# Patient Record
Sex: Female | Born: 1940 | Race: Black or African American | Hispanic: No | Smoking: Never smoker
Health system: Southern US, Community
[De-identification: ages and names within clinical notes are randomized; demographics above are authoritative.]

## PROBLEM LIST (undated history)

## (undated) DIAGNOSIS — K649 Unspecified hemorrhoids: Secondary | ICD-10-CM

## (undated) DIAGNOSIS — M1990 Unspecified osteoarthritis, unspecified site: Secondary | ICD-10-CM

## (undated) DIAGNOSIS — T4145XA Adverse effect of unspecified anesthetic, initial encounter: Secondary | ICD-10-CM

## (undated) DIAGNOSIS — I10 Essential (primary) hypertension: Secondary | ICD-10-CM

## (undated) DIAGNOSIS — H33199 Other retinoschisis and retinal cysts, unspecified eye: Secondary | ICD-10-CM

## (undated) DIAGNOSIS — T8859XA Other complications of anesthesia, initial encounter: Secondary | ICD-10-CM

## (undated) DIAGNOSIS — E039 Hypothyroidism, unspecified: Secondary | ICD-10-CM

## (undated) DIAGNOSIS — E785 Hyperlipidemia, unspecified: Secondary | ICD-10-CM

## (undated) HISTORY — PX: CHOLECYSTECTOMY: SHX55

## (undated) HISTORY — PX: CATARACT EXTRACTION: SUR2

## (undated) HISTORY — DX: Unspecified hemorrhoids: K64.9

## (undated) HISTORY — DX: Unspecified osteoarthritis, unspecified site: M19.90

## (undated) HISTORY — DX: Essential (primary) hypertension: I10

## (undated) HISTORY — DX: Hyperlipidemia, unspecified: E78.5

## (undated) HISTORY — PX: ABDOMINAL HYSTERECTOMY: SHX81

---

## 1998-03-15 ENCOUNTER — Ambulatory Visit (HOSPITAL_COMMUNITY): Admission: RE | Admit: 1998-03-15 | Discharge: 1998-03-15 | Payer: Self-pay | Admitting: Pulmonary Disease

## 1998-03-15 ENCOUNTER — Encounter: Payer: Self-pay | Admitting: Pulmonary Disease

## 1999-08-16 ENCOUNTER — Other Ambulatory Visit: Admission: RE | Admit: 1999-08-16 | Discharge: 1999-08-16 | Payer: Self-pay | Admitting: Obstetrics and Gynecology

## 2000-01-31 ENCOUNTER — Encounter: Payer: Self-pay | Admitting: Pulmonary Disease

## 2000-01-31 ENCOUNTER — Ambulatory Visit: Admission: RE | Admit: 2000-01-31 | Discharge: 2000-01-31 | Payer: Self-pay | Admitting: Pulmonary Disease

## 2002-04-16 ENCOUNTER — Ambulatory Visit (HOSPITAL_BASED_OUTPATIENT_CLINIC_OR_DEPARTMENT_OTHER): Admission: RE | Admit: 2002-04-16 | Discharge: 2002-04-16 | Payer: Self-pay | Admitting: Pulmonary Disease

## 2002-12-19 ENCOUNTER — Ambulatory Visit (HOSPITAL_COMMUNITY): Admission: RE | Admit: 2002-12-19 | Discharge: 2002-12-19 | Payer: Self-pay | Admitting: Ophthalmology

## 2002-12-19 ENCOUNTER — Encounter: Payer: Self-pay | Admitting: Ophthalmology

## 2004-06-28 ENCOUNTER — Ambulatory Visit (HOSPITAL_COMMUNITY): Admission: RE | Admit: 2004-06-28 | Discharge: 2004-06-28 | Payer: Self-pay | Admitting: Gastroenterology

## 2004-09-06 ENCOUNTER — Emergency Department (HOSPITAL_COMMUNITY): Admission: EM | Admit: 2004-09-06 | Discharge: 2004-09-06 | Payer: Self-pay | Admitting: Family Medicine

## 2004-09-07 ENCOUNTER — Ambulatory Visit (HOSPITAL_COMMUNITY): Admission: RE | Admit: 2004-09-07 | Discharge: 2004-09-07 | Payer: Self-pay | Admitting: Pulmonary Disease

## 2004-09-25 ENCOUNTER — Emergency Department (HOSPITAL_COMMUNITY): Admission: EM | Admit: 2004-09-25 | Discharge: 2004-09-25 | Payer: Self-pay | Admitting: Family Medicine

## 2006-08-12 ENCOUNTER — Ambulatory Visit: Payer: Self-pay | Admitting: Vascular Surgery

## 2007-11-27 ENCOUNTER — Emergency Department (HOSPITAL_COMMUNITY): Admission: EM | Admit: 2007-11-27 | Discharge: 2007-11-27 | Payer: Self-pay | Admitting: Emergency Medicine

## 2010-08-25 NOTE — Op Note (Signed)
NAMELINDSEA, OLIVAR               ACCOUNT NO.:  [id number]   MEDICAL RECORD NO.:  [id number]          PATIENT TYPE:  AMB   LOCATION:  ENDO                         FACILITY:  [facility]   PHYSICIAN:  Anselmo Rod, M.D.  DATE OF BIRTH:  11-03-1940   DATE OF PROCEDURE:  06/28/2004  DATE OF DISCHARGE:                                 OPERATIVE REPORT   PROCEDURE PERFORMED:  Screening colonoscopy.   ENDOSCOPIST:  Anselmo Rod, M.D.   INSTRUMENT USED:  Olympus video colonoscope.   INDICATION FOR PROCEDURE:  [age]-year-old African American female  undergoing screening colonoscopy with change in bowel habits, rectal  bleeding and family history of colon cancer to rule out colonic polyps,  masses, etc.   PREPROCEDURE PREPARATION:  Informed consent was procured from the patient.  The patient was fasted for 8 hours prior to the procedure and prepped with a  bottle of magnesium citrate and a gallon of GoLYTELY the night prior to the  procedure.  Risks and benefits of the procedure including a 10% missed rate  of cancer in polyps was discussed with the patient as well.   PREPROCEDURE PHYSICAL:  VITAL SIGNS:  The patient had stable vital signs.  NECK:  Supple.  CHEST:  Clear to auscultation.  S1 and S2 regular.  ABDOMEN:  Soft with normal bowel sounds.   DESCRIPTION OF PROCEDURE:  The patient was placed in the left lateral  decubitus position and sedated with 80 mg of Demerol and 7.5 mg of Versed in  slow incremental doses.  Once the patient was adequately sedated and  maintained on low-flow oxygen and continuous cardiac monitoring, the Olympus  video colonoscope was advanced from the rectum to the cecum with extreme  difficulty.  The patient's position was changed from the left lateral to the  supine and the right lateral position with gentle application of abdominal  pressure and then back again to her supine position to reach the cecum.  The  patient had a significant amount of  residual stool in the colon; multiple  washes were done.  The patient had a lipomatous IC valve.  The appendiceal  orifice and ileocecal valve were clearly visualized and photographed.  A few  early scattered sigmoid diverticula were seen, but these were very, very  small and in the early stages of formation.  Retroflexion in the rectum  revealed small internal hemorrhoids.  The patient tolerated the procedure  well without complications.   IMPRESSION:  1.  Small non-bleeding internal hemorrhoid.  2.  Lipomatous ileocecal valve.  3.  A few early scattered diverticula.  4.  Very difficult procedure.   RECOMMENDATIONS:  1.  Repeat exam is recommended in the next 5 years; a barium enema will      probably be a better option at that time.  2.  Outpatient followup as need arises in the future.  3.  High-fiber diet with liberal fluid intake.      JNM/MEDQ  D:  06/28/2004  T:  06/28/2004  Job:  [id number]   cc:   Leonette Most A. Clearance Coots, M.D.  Eino Farber., M.D.  [address] [place]  [place] [address]  Fax: [phone]

## 2010-10-26 ENCOUNTER — Ambulatory Visit (INDEPENDENT_AMBULATORY_CARE_PROVIDER_SITE_OTHER): Payer: Medicare Other

## 2010-10-26 ENCOUNTER — Inpatient Hospital Stay (INDEPENDENT_AMBULATORY_CARE_PROVIDER_SITE_OTHER)
Admission: RE | Admit: 2010-10-26 | Discharge: 2010-10-26 | Disposition: A | Discharge status: Home / Self Care | Payer: Medicare Other | Source: Ambulatory Visit | Attending: Emergency Medicine | Admitting: Emergency Medicine

## 2010-10-26 DIAGNOSIS — S82899A Other fracture of unspecified lower leg, initial encounter for closed fracture: Secondary | ICD-10-CM

## 2010-10-26 DIAGNOSIS — S8000XA Contusion of unspecified knee, initial encounter: Secondary | ICD-10-CM

## 2011-04-19 DIAGNOSIS — E1139 Type 2 diabetes mellitus with other diabetic ophthalmic complication: Secondary | ICD-10-CM | POA: Diagnosis not present

## 2011-04-19 DIAGNOSIS — H2010 Chronic iridocyclitis, unspecified eye: Secondary | ICD-10-CM | POA: Diagnosis not present

## 2011-04-19 DIAGNOSIS — Z113 Encounter for screening for infections with a predominantly sexual mode of transmission: Secondary | ICD-10-CM | POA: Diagnosis not present

## 2011-04-19 DIAGNOSIS — E11311 Type 2 diabetes mellitus with unspecified diabetic retinopathy with macular edema: Secondary | ICD-10-CM | POA: Diagnosis not present

## 2011-04-19 DIAGNOSIS — E119 Type 2 diabetes mellitus without complications: Secondary | ICD-10-CM | POA: Diagnosis not present

## 2011-04-19 DIAGNOSIS — H3020 Posterior cyclitis, unspecified eye: Secondary | ICD-10-CM | POA: Diagnosis not present

## 2011-05-22 DIAGNOSIS — H35359 Cystoid macular degeneration, unspecified eye: Secondary | ICD-10-CM | POA: Diagnosis not present

## 2011-05-22 DIAGNOSIS — H2010 Chronic iridocyclitis, unspecified eye: Secondary | ICD-10-CM | POA: Diagnosis not present

## 2011-05-30 DIAGNOSIS — I10 Essential (primary) hypertension: Secondary | ICD-10-CM | POA: Diagnosis not present

## 2011-05-30 DIAGNOSIS — H209 Unspecified iridocyclitis: Secondary | ICD-10-CM | POA: Diagnosis not present

## 2011-05-30 DIAGNOSIS — E119 Type 2 diabetes mellitus without complications: Secondary | ICD-10-CM | POA: Diagnosis not present

## 2011-05-30 DIAGNOSIS — M899 Disorder of bone, unspecified: Secondary | ICD-10-CM | POA: Diagnosis not present

## 2011-05-30 DIAGNOSIS — E669 Obesity, unspecified: Secondary | ICD-10-CM | POA: Diagnosis not present

## 2011-06-19 DIAGNOSIS — H2010 Chronic iridocyclitis, unspecified eye: Secondary | ICD-10-CM | POA: Diagnosis not present

## 2011-06-19 DIAGNOSIS — B0051 Herpesviral iridocyclitis: Secondary | ICD-10-CM | POA: Diagnosis not present

## 2011-06-19 DIAGNOSIS — H3520 Other non-diabetic proliferative retinopathy, unspecified eye: Secondary | ICD-10-CM | POA: Diagnosis not present

## 2011-08-21 DIAGNOSIS — H579 Unspecified disorder of eye and adnexa: Secondary | ICD-10-CM | POA: Diagnosis not present

## 2011-08-21 DIAGNOSIS — E1139 Type 2 diabetes mellitus with other diabetic ophthalmic complication: Secondary | ICD-10-CM | POA: Diagnosis not present

## 2011-08-21 DIAGNOSIS — E119 Type 2 diabetes mellitus without complications: Secondary | ICD-10-CM | POA: Diagnosis not present

## 2011-08-21 DIAGNOSIS — B0051 Herpesviral iridocyclitis: Secondary | ICD-10-CM | POA: Diagnosis not present

## 2011-08-21 DIAGNOSIS — H2010 Chronic iridocyclitis, unspecified eye: Secondary | ICD-10-CM | POA: Diagnosis not present

## 2011-08-21 DIAGNOSIS — E11329 Type 2 diabetes mellitus with mild nonproliferative diabetic retinopathy without macular edema: Secondary | ICD-10-CM | POA: Diagnosis not present

## 2011-08-30 DIAGNOSIS — S82899A Other fracture of unspecified lower leg, initial encounter for closed fracture: Secondary | ICD-10-CM | POA: Diagnosis not present

## 2011-08-30 DIAGNOSIS — M25569 Pain in unspecified knee: Secondary | ICD-10-CM | POA: Diagnosis not present

## 2011-09-06 DIAGNOSIS — S82899A Other fracture of unspecified lower leg, initial encounter for closed fracture: Secondary | ICD-10-CM | POA: Diagnosis not present

## 2011-09-11 DIAGNOSIS — S82899A Other fracture of unspecified lower leg, initial encounter for closed fracture: Secondary | ICD-10-CM | POA: Diagnosis not present

## 2011-09-14 DIAGNOSIS — S82899A Other fracture of unspecified lower leg, initial encounter for closed fracture: Secondary | ICD-10-CM | POA: Diagnosis not present

## 2011-09-18 DIAGNOSIS — S93409A Sprain of unspecified ligament of unspecified ankle, initial encounter: Secondary | ICD-10-CM | POA: Diagnosis not present

## 2011-09-20 DIAGNOSIS — S82899A Other fracture of unspecified lower leg, initial encounter for closed fracture: Secondary | ICD-10-CM | POA: Diagnosis not present

## 2011-09-27 DIAGNOSIS — E119 Type 2 diabetes mellitus without complications: Secondary | ICD-10-CM | POA: Diagnosis not present

## 2011-09-27 DIAGNOSIS — I10 Essential (primary) hypertension: Secondary | ICD-10-CM | POA: Diagnosis not present

## 2011-09-27 DIAGNOSIS — H209 Unspecified iridocyclitis: Secondary | ICD-10-CM | POA: Diagnosis not present

## 2011-09-27 DIAGNOSIS — E039 Hypothyroidism, unspecified: Secondary | ICD-10-CM | POA: Diagnosis not present

## 2011-09-27 DIAGNOSIS — E785 Hyperlipidemia, unspecified: Secondary | ICD-10-CM | POA: Diagnosis not present

## 2011-10-05 ENCOUNTER — Encounter (INDEPENDENT_AMBULATORY_CARE_PROVIDER_SITE_OTHER): Payer: Self-pay | Admitting: General Surgery

## 2011-10-05 ENCOUNTER — Ambulatory Visit (INDEPENDENT_AMBULATORY_CARE_PROVIDER_SITE_OTHER): Payer: Medicare Other | Admitting: General Surgery

## 2011-10-05 VITALS — BP 162/94 | HR 84 | Temp 97.2°F | Resp 18 | Ht 63.0 in | Wt 209.4 lb

## 2011-10-05 DIAGNOSIS — K644 Residual hemorrhoidal skin tags: Secondary | ICD-10-CM | POA: Diagnosis not present

## 2011-10-05 DIAGNOSIS — K648 Other hemorrhoids: Secondary | ICD-10-CM | POA: Diagnosis not present

## 2011-10-05 MED ORDER — HYDROCORTISONE ACETATE 25 MG RE SUPP: 25.0000 mg | Freq: Two times a day (BID) | RECTAL | Status: AC

## 2011-10-05 NOTE — Patient Instructions (Signed)
Hemorrhoids  Hemorrhoids are enlarged (dilated) veins around the rectum. There are 2 types of hemorrhoids, and the type of hemorrhoid is determined by its location. Internal hemorrhoids occur in the veins just inside the rectum.They are usually not painful, but they may bleed.However, they may poke through to the outside and become irritated and painful. External hemorrhoids involve the veins outside the anus and can be felt as a painful swelling or hard lump near the anus.They are often itchy and may crack and bleed. Sometimes clots will form in the veins. This makes them swollen and painful. These are called thrombosed hemorrhoids. CAUSES Causes of hemorrhoids include:  Pregnancy. This increases the pressure in the hemorrhoidal veins.   Constipation.   Straining to have a bowel movement.   Obesity.   Heavy lifting or other activity that caused you to strain.   TREATMENT Most of the time hemorrhoids improve in 1 to 2 weeks. However, if symptoms do not seem to be getting better or if you have a lot of rectal bleeding, your caregiver may perform a procedure to help make the hemorrhoids get smaller or remove them completely.Possible treatments include:  Rubber band ligation. A rubber band is placed at the base of the hemorrhoid to cut off the circulation.   Sclerotherapy. A chemical is injected to shrink the hemorrhoid.   Infrared light therapy. Tools are used to burn the hemorrhoid.   Hemorrhoidectomy. This is surgical removal of the hemorrhoid.   HOME CARE INSTRUCTIONS   Increase fiber in your diet. Ask your caregiver about using fiber supplements.   Drink enough water and fluids to keep your urine clear or pale yellow.   Exercise regularly.   Go to the bathroom when you have the urge to have a bowel movement. Do not wait.   Avoid straining to have bowel movements.   Keep the anal area dry and clean.   Only take over-the-counter or prescription medicines for pain,  discomfort, or fever as directed by your caregiver.   Take warm sitz baths for 20 to 30 minutes, 3 to 4 times per day.   If the hemorrhoids are very tender and swollen, place ice packs on the area as tolerated. Using ice packs between sitz baths may be helpful. Fill a plastic bag with ice. Place a towel between the bag of ice and your skin.   Medicated creams and suppositories may be used or applied as directed.   Do not use a donut-shaped pillow or sit on the toilet for long periods. This increases blood pooling and pain.   SEEK MEDICAL CARE IF:   You have increasing pain and swelling that is not controlled with your medicine.   You have uncontrolled bleeding.   You have difficulty or you are unable to have a bowel movement.   You have pain or inflammation outside the area of the hemorrhoids.   You have chills or an oral temperature above 102 F (38.9 C).     

## 2011-10-07 NOTE — Progress Notes (Signed)
HISTORY: Pt is 71 year old female that I saw last year for hemorrhoids.  I injected the internal ones.  She did well and was around 90% better on follow up.  Since that time, however, her hemorrhoids have come back and are worsening.  She ran out of prescription hemorrhoid medication.  She has been taking stool softeners and has tried to avoid spending significant time on the toilet.  She is having bleeding, soreness, swelling, difficulty cleaning, and a sense of seeping of stool at times.     PERTINENT REVIEW OF SYSTEMS: Otherwise negative.     EXAM: Head: Normocephalic and atraumatic.  Eyes:  Conjunctivae are normal. Pupils are equal, round, and reactive to light. No scleral icterus.  Neck:  Normal range of motion. Neck supple. No tracheal deviation present. No thyromegaly present.  Resp: No respiratory distress, normal effort. Rectal:  Circumferential prolapsing internal and external hemorrhoids.  Anoscopy performed.  The internal ones are reduced and injected.   Neurological: Alert and oriented to person, place, and time. Coordination normal.  Skin: Skin is warm and dry. No rash noted. No diaphoretic. No erythema. No pallor.  Psychiatric: Normal mood and affect. Normal behavior. Judgment and thought content normal.      ASSESSMENT AND PLAN:   Internal and external prolapsed hemorrhoids, bleeding. Pt with recurrent hemorrhoids. Internal ones injected. Pt prescribed suppositories.  Will follow up in 6-8 weeks. May need other injection. External hemorrhoids are also problematic.  Will address later.  Reviewed bowel regimen and stressed importance of good bowel habits to pt.      Maudry Diego, MD Surgical Oncology, General & Endocrine Surgery [facility]  Gwen Pounds, MD Charna Elizabeth, MD

## 2011-10-07 NOTE — Assessment & Plan Note (Signed)
Pt with recurrent hemorrhoids. Internal ones injected. Pt prescribed suppositories.  Will follow up in 6-8 weeks. May need other injection. External hemorrhoids are also problematic.  Will address later.  Reviewed bowel regimen and stressed importance of good bowel habits to pt.

## 2011-10-23 DIAGNOSIS — E1139 Type 2 diabetes mellitus with other diabetic ophthalmic complication: Secondary | ICD-10-CM | POA: Diagnosis not present

## 2011-10-23 DIAGNOSIS — B0051 Herpesviral iridocyclitis: Secondary | ICD-10-CM | POA: Diagnosis not present

## 2011-10-23 DIAGNOSIS — E11329 Type 2 diabetes mellitus with mild nonproliferative diabetic retinopathy without macular edema: Secondary | ICD-10-CM | POA: Diagnosis not present

## 2011-10-23 DIAGNOSIS — E119 Type 2 diabetes mellitus without complications: Secondary | ICD-10-CM | POA: Diagnosis not present

## 2011-10-23 DIAGNOSIS — H579 Unspecified disorder of eye and adnexa: Secondary | ICD-10-CM | POA: Diagnosis not present

## 2012-03-26 DIAGNOSIS — I10 Essential (primary) hypertension: Secondary | ICD-10-CM | POA: Diagnosis not present

## 2012-03-26 DIAGNOSIS — E119 Type 2 diabetes mellitus without complications: Secondary | ICD-10-CM | POA: Diagnosis not present

## 2012-03-26 DIAGNOSIS — E039 Hypothyroidism, unspecified: Secondary | ICD-10-CM | POA: Diagnosis not present

## 2012-03-26 DIAGNOSIS — E785 Hyperlipidemia, unspecified: Secondary | ICD-10-CM | POA: Diagnosis not present

## 2012-04-29 DIAGNOSIS — H3020 Posterior cyclitis, unspecified eye: Secondary | ICD-10-CM | POA: Diagnosis not present

## 2012-04-29 DIAGNOSIS — E1139 Type 2 diabetes mellitus with other diabetic ophthalmic complication: Secondary | ICD-10-CM | POA: Diagnosis not present

## 2012-04-29 DIAGNOSIS — H35359 Cystoid macular degeneration, unspecified eye: Secondary | ICD-10-CM | POA: Diagnosis not present

## 2012-04-29 DIAGNOSIS — E119 Type 2 diabetes mellitus without complications: Secondary | ICD-10-CM | POA: Diagnosis not present

## 2012-04-29 DIAGNOSIS — E11311 Type 2 diabetes mellitus with unspecified diabetic retinopathy with macular edema: Secondary | ICD-10-CM | POA: Diagnosis not present

## 2012-04-29 DIAGNOSIS — H3520 Other non-diabetic proliferative retinopathy, unspecified eye: Secondary | ICD-10-CM | POA: Diagnosis not present

## 2012-04-29 DIAGNOSIS — B0051 Herpesviral iridocyclitis: Secondary | ICD-10-CM | POA: Diagnosis not present

## 2012-04-29 DIAGNOSIS — H2010 Chronic iridocyclitis, unspecified eye: Secondary | ICD-10-CM | POA: Diagnosis not present

## 2012-05-13 DIAGNOSIS — I10 Essential (primary) hypertension: Secondary | ICD-10-CM | POA: Diagnosis not present

## 2012-05-13 DIAGNOSIS — IMO0001 Reserved for inherently not codable concepts without codable children: Secondary | ICD-10-CM | POA: Diagnosis not present

## 2012-05-13 DIAGNOSIS — Z0000 Encounter for general adult medical examination without abnormal findings: Secondary | ICD-10-CM | POA: Diagnosis not present

## 2012-05-13 DIAGNOSIS — E785 Hyperlipidemia, unspecified: Secondary | ICD-10-CM | POA: Diagnosis not present

## 2012-05-13 DIAGNOSIS — Z23 Encounter for immunization: Secondary | ICD-10-CM | POA: Diagnosis not present

## 2012-06-03 ENCOUNTER — Ambulatory Visit: Payer: Medicare Other | Admitting: Dietician

## 2012-06-19 ENCOUNTER — Encounter: Payer: Self-pay | Admitting: Dietician

## 2012-06-19 ENCOUNTER — Encounter: Payer: Medicare Other | Attending: Internal Medicine | Admitting: Dietician

## 2012-06-19 VITALS — Ht 63.0 in | Wt 208.1 lb

## 2012-06-19 DIAGNOSIS — Z713 Dietary counseling and surveillance: Secondary | ICD-10-CM | POA: Diagnosis not present

## 2012-06-19 DIAGNOSIS — E119 Type 2 diabetes mellitus without complications: Secondary | ICD-10-CM | POA: Diagnosis not present

## 2012-06-19 NOTE — Progress Notes (Signed)
Diabetes Self-Management Education  Visit Number: First/Initial  06/19/2012 [name], identified by name and date of birth, is a 72 y.o. female with Diabetes Type: Type 2.  Other people present during visit:   (none)   Patient Concerns:  Nutrition/Meal planning;Weight Control  Height 5\' 3"  (1.6 m), weight 208 lb 1.6 oz (94.394 kg). Body mass index is 36.87 kg/(m^2).  Lab Results: No results found for this basename: LDLCALC, HGBA1C     Family History  Problem Relation Age of Onset  . Cancer Father     lung  . Cancer Sister     breast   History  Substance Use Topics  . Smoking status: Never Smoker   . Smokeless tobacco: Never Used  . Alcohol Use: No    Support Systems:  Lives Alone Special Needs:  None  Prior DM Education:  Yes at [facility] a number of years ago. Daily Foot Exams: Yes Patient Belief / Attitude about Diabetes:  Diabetes can be controlled  Assessment comments: Has had previous diabetes education a number of years ago.  Has not always been following her diet and last year broke her ankle and did not exercise.  Wants to get back to walking and eating right.  Lives alone and does not want to go on insulin. Anxious to get back on track and to get the A1C to 7.0%. Most recent A1C is at 9.4%.  Diet Recall:  24-hr recall:  B ( AM): 7-9 Am  Oatmeal (may have raisins, small pack) use 5 minute oatmeal 1 cup with cinnamon and some ground flax seed..  Boiled egg or 5 [place] nuts. Water to drink.  Sometimes is she thinks her blood is low (Iron level) will add 1-2 oz of grape juice to a glass of 10 oz of water.   OR Cereal with flax seed barley and wheat at 1 cup with skim milk (3 oz). Snk ( AM): Piece of fruit orange or pear.  L ( PM): May have something about 2-2:30-3:00 PM Might be a cup of the cereal.  OR have leftovers of salmon, asparagus and brown rice Snk ( PM): rare, nuts [place] or hazlenuts D ( PM): 6:00 chicken wings with BBQ sauce 3, with white potatoes with  onions 1/2 cup and a little cabbage 1/2 dup. Snk ( PM): Try to snack before 9:00, tangerine or half an apple. Beverages: water,   Education Topics Reviewed with Patient Today:  Topic Points Discussed  Disease State    Nutrition Management Food label reading, portion sizes and measuring food.;Carbohydrate counting;Reviewed blood glucose goals for pre and post meals and how to evaluate the patients' food intake on their blood glucose level.  Physical Activity and Exercise    Medications Reviewed patients medication for diabetes, action, purpose, timing of dose and side effects.  Monitoring Taught/discussed recording of test results and interpretation of SMBG.;Daily foot exams  Acute Complications Discussed and identified patients' treatment of hypoglycemia  Chronic Complications    Psychosocial Adjustment    Goal Setting    Preconception Care (if applicable)      PATIENTS GOALS   Learning Objective(s):     Goal The patient agrees to:  Nutrition Follow meal plan discussed  Physical Activity Exercise 1-2 times per week  Medications take my medication as prescribed  Monitoring    Problem Solving    Reducing Risk    Health Coping      PERSONALIZED PLAN / SUPPORT  Self-Management Support:  Other (comment) (Lives  alone, doesn/t want to have to go on insulin.) ______________________________________________________________________  Outcomes:  Expected Outcomes:  Demonstrated interest in learning. Expect positive outcomes  Education material provided: Living Well with Diabetes, Controlling Diabetes, Carb Counting guide by [employer], [employer], [employer] and the [employer] with a Diet Prescription.  If problems or questions, patient to contact team via:  Phone  Time in: 1100     Time out: 1200  Future DSME appointment: - 4-6 wks   [facility] 06/19/2012 2:43 PM

## 2012-06-19 NOTE — Patient Instructions (Addendum)
   Test your blood glucose after the largest meal of the day (two hours after the first bite of the meal.  Goal is 80-160 mg).  Try to take the Glimepiride 30 minutes before the meal.  Continue to eat regular meals and snacks.  Record your glucose readings.  Ask the pharmacist regarding the eye medication and interaction with the Glimepiride.   Practice counting your carbs and measuring portions at home.

## 2012-06-26 DIAGNOSIS — E119 Type 2 diabetes mellitus without complications: Secondary | ICD-10-CM | POA: Diagnosis not present

## 2012-06-26 DIAGNOSIS — E669 Obesity, unspecified: Secondary | ICD-10-CM | POA: Diagnosis not present

## 2012-06-26 DIAGNOSIS — I10 Essential (primary) hypertension: Secondary | ICD-10-CM | POA: Diagnosis not present

## 2012-08-21 DIAGNOSIS — E039 Hypothyroidism, unspecified: Secondary | ICD-10-CM | POA: Diagnosis not present

## 2012-08-21 DIAGNOSIS — E785 Hyperlipidemia, unspecified: Secondary | ICD-10-CM | POA: Diagnosis not present

## 2012-08-21 DIAGNOSIS — E1169 Type 2 diabetes mellitus with other specified complication: Secondary | ICD-10-CM | POA: Diagnosis not present

## 2012-08-21 DIAGNOSIS — I10 Essential (primary) hypertension: Secondary | ICD-10-CM | POA: Diagnosis not present

## 2012-08-21 DIAGNOSIS — Z6835 Body mass index (BMI) 35.0-35.9, adult: Secondary | ICD-10-CM | POA: Diagnosis not present

## 2012-09-02 DIAGNOSIS — B0051 Herpesviral iridocyclitis: Secondary | ICD-10-CM | POA: Diagnosis not present

## 2012-09-02 DIAGNOSIS — H3520 Other non-diabetic proliferative retinopathy, unspecified eye: Secondary | ICD-10-CM | POA: Diagnosis not present

## 2012-09-02 DIAGNOSIS — H2010 Chronic iridocyclitis, unspecified eye: Secondary | ICD-10-CM | POA: Diagnosis not present

## 2012-09-02 DIAGNOSIS — E119 Type 2 diabetes mellitus without complications: Secondary | ICD-10-CM | POA: Diagnosis not present

## 2012-09-02 DIAGNOSIS — H3020 Posterior cyclitis, unspecified eye: Secondary | ICD-10-CM | POA: Diagnosis not present

## 2012-09-02 DIAGNOSIS — E11311 Type 2 diabetes mellitus with unspecified diabetic retinopathy with macular edema: Secondary | ICD-10-CM | POA: Diagnosis not present

## 2012-09-02 DIAGNOSIS — E1139 Type 2 diabetes mellitus with other diabetic ophthalmic complication: Secondary | ICD-10-CM | POA: Diagnosis not present

## 2012-09-02 DIAGNOSIS — H35359 Cystoid macular degeneration, unspecified eye: Secondary | ICD-10-CM | POA: Diagnosis not present

## 2012-11-17 ENCOUNTER — Ambulatory Visit (INDEPENDENT_AMBULATORY_CARE_PROVIDER_SITE_OTHER): Payer: Medicare Other | Admitting: General Surgery

## 2012-11-17 ENCOUNTER — Encounter (INDEPENDENT_AMBULATORY_CARE_PROVIDER_SITE_OTHER): Payer: Self-pay | Admitting: General Surgery

## 2012-11-17 VITALS — BP 128/88 | HR 84 | Temp 97.4°F | Resp 14 | Ht 63.0 in | Wt 202.6 lb

## 2012-11-17 DIAGNOSIS — K644 Residual hemorrhoidal skin tags: Secondary | ICD-10-CM

## 2012-11-17 DIAGNOSIS — K648 Other hemorrhoids: Secondary | ICD-10-CM

## 2012-11-17 NOTE — Assessment & Plan Note (Signed)
2 internal hemorrhoids banded.    2 injected.   These are pretty significant prolapsing hemorrhoids.    I will see her back in 6 weeks.  She may need additional injection and/or banding.   At that point, I will restart anusol or proctofoam.

## 2012-11-17 NOTE — Progress Notes (Signed)
HISTORY: Pt is 72 year old female that I saw last year for hemorrhoids.  I injected the internal ones. The patient is complaining of bleeding, difficulty cleaning, burning, and prolapse.  She denies itching.  She had colonoscopy last year that was negative for masses.  Her main complaints are bleeding and prolapse.  She frequently stands up and has blood dripping down her leg and onto the floor.     PERTINENT REVIEW OF SYSTEMS: Otherwise negative x 11.     EXAM: Head: Normocephalic and atraumatic.  Eyes:  Conjunctivae are normal. Pupils are equal, round, and reactive to light. No scleral icterus.  Neck:  Normal range of motion. Neck supple. No tracheal deviation present. No thyromegaly present.  Resp: No respiratory distress, normal effort. Rectal:  Circumferential prolapsing internal and external hemorrhoids. Internal ones that were prolapsed reduced.  Hemorrhoids very friable.  Anoscopy demonstrates very large circumferential internal hemorrhoids.  The posterior left and right ones were banded.  Two anteriorly were injected.   Neurological: Alert and oriented to person, place, and time. Coordination normal.  Skin: Skin is warm and dry. No rash noted. No diaphoretic. No erythema. No pallor.  Psychiatric: Normal mood and affect. Normal behavior. Judgment and thought content normal.      ASSESSMENT AND PLAN:   Internal and external prolapsed hemorrhoids, bleeding. 2 internal hemorrhoids banded.    2 injected.   These are pretty significant prolapsing hemorrhoids.    I will see her back in 6 weeks.  She may need additional injection and/or banding.   At that point, I will restart anusol or proctofoam.         Maudry Diego, MD Surgical Oncology, General & Endocrine Surgery [facility]  Gwen Pounds, MD No ref. provider found

## 2012-11-17 NOTE — Patient Instructions (Signed)
Anticipate some bleeding for the next day or so.  You should NOT be soaking through multiple pads, if so, call our office.    Follow up in 6 weeks.

## 2013-02-02 DIAGNOSIS — E785 Hyperlipidemia, unspecified: Secondary | ICD-10-CM | POA: Diagnosis not present

## 2013-02-02 DIAGNOSIS — Z6836 Body mass index (BMI) 36.0-36.9, adult: Secondary | ICD-10-CM | POA: Diagnosis not present

## 2013-02-02 DIAGNOSIS — K649 Unspecified hemorrhoids: Secondary | ICD-10-CM | POA: Diagnosis not present

## 2013-02-02 DIAGNOSIS — E039 Hypothyroidism, unspecified: Secondary | ICD-10-CM | POA: Diagnosis not present

## 2013-02-02 DIAGNOSIS — I10 Essential (primary) hypertension: Secondary | ICD-10-CM | POA: Diagnosis not present

## 2013-02-02 DIAGNOSIS — E669 Obesity, unspecified: Secondary | ICD-10-CM | POA: Diagnosis not present

## 2013-02-02 DIAGNOSIS — E119 Type 2 diabetes mellitus without complications: Secondary | ICD-10-CM | POA: Diagnosis not present

## 2013-02-02 DIAGNOSIS — Z23 Encounter for immunization: Secondary | ICD-10-CM | POA: Diagnosis not present

## 2013-02-27 DIAGNOSIS — E11311 Type 2 diabetes mellitus with unspecified diabetic retinopathy with macular edema: Secondary | ICD-10-CM | POA: Diagnosis not present

## 2013-02-27 DIAGNOSIS — H3020 Posterior cyclitis, unspecified eye: Secondary | ICD-10-CM | POA: Diagnosis not present

## 2013-02-27 DIAGNOSIS — B0051 Herpesviral iridocyclitis: Secondary | ICD-10-CM | POA: Diagnosis not present

## 2013-02-27 DIAGNOSIS — H35359 Cystoid macular degeneration, unspecified eye: Secondary | ICD-10-CM | POA: Diagnosis not present

## 2013-02-27 DIAGNOSIS — E1139 Type 2 diabetes mellitus with other diabetic ophthalmic complication: Secondary | ICD-10-CM | POA: Diagnosis not present

## 2013-02-27 DIAGNOSIS — H2010 Chronic iridocyclitis, unspecified eye: Secondary | ICD-10-CM | POA: Diagnosis not present

## 2013-02-27 DIAGNOSIS — E119 Type 2 diabetes mellitus without complications: Secondary | ICD-10-CM | POA: Diagnosis not present

## 2013-02-27 DIAGNOSIS — H3520 Other non-diabetic proliferative retinopathy, unspecified eye: Secondary | ICD-10-CM | POA: Diagnosis not present

## 2013-04-14 DIAGNOSIS — M545 Low back pain, unspecified: Secondary | ICD-10-CM | POA: Diagnosis not present

## 2013-04-21 DIAGNOSIS — M545 Low back pain, unspecified: Secondary | ICD-10-CM | POA: Diagnosis not present

## 2013-04-28 DIAGNOSIS — M545 Low back pain, unspecified: Secondary | ICD-10-CM | POA: Diagnosis not present

## 2013-05-04 DIAGNOSIS — M545 Low back pain, unspecified: Secondary | ICD-10-CM | POA: Diagnosis not present

## 2013-05-19 DIAGNOSIS — E039 Hypothyroidism, unspecified: Secondary | ICD-10-CM | POA: Diagnosis not present

## 2013-05-19 DIAGNOSIS — IMO0001 Reserved for inherently not codable concepts without codable children: Secondary | ICD-10-CM | POA: Diagnosis not present

## 2013-05-19 DIAGNOSIS — I10 Essential (primary) hypertension: Secondary | ICD-10-CM | POA: Diagnosis not present

## 2013-05-19 DIAGNOSIS — E785 Hyperlipidemia, unspecified: Secondary | ICD-10-CM | POA: Diagnosis not present

## 2013-05-28 DIAGNOSIS — I10 Essential (primary) hypertension: Secondary | ICD-10-CM | POA: Diagnosis not present

## 2013-05-28 DIAGNOSIS — I517 Cardiomegaly: Secondary | ICD-10-CM | POA: Diagnosis not present

## 2013-05-28 DIAGNOSIS — H209 Unspecified iridocyclitis: Secondary | ICD-10-CM | POA: Diagnosis not present

## 2013-05-28 DIAGNOSIS — I4949 Other premature depolarization: Secondary | ICD-10-CM | POA: Diagnosis not present

## 2013-05-28 DIAGNOSIS — E785 Hyperlipidemia, unspecified: Secondary | ICD-10-CM | POA: Diagnosis not present

## 2013-05-28 DIAGNOSIS — E119 Type 2 diabetes mellitus without complications: Secondary | ICD-10-CM | POA: Diagnosis not present

## 2013-05-28 DIAGNOSIS — Z1212 Encounter for screening for malignant neoplasm of rectum: Secondary | ICD-10-CM | POA: Diagnosis not present

## 2013-05-28 DIAGNOSIS — Z1331 Encounter for screening for depression: Secondary | ICD-10-CM | POA: Diagnosis not present

## 2013-05-28 DIAGNOSIS — E039 Hypothyroidism, unspecified: Secondary | ICD-10-CM | POA: Diagnosis not present

## 2013-05-28 DIAGNOSIS — Z0000 Encounter for general adult medical examination without abnormal findings: Secondary | ICD-10-CM | POA: Diagnosis not present

## 2013-06-25 DIAGNOSIS — Z1231 Encounter for screening mammogram for malignant neoplasm of breast: Secondary | ICD-10-CM | POA: Diagnosis not present

## 2013-06-25 DIAGNOSIS — Z803 Family history of malignant neoplasm of breast: Secondary | ICD-10-CM | POA: Diagnosis not present

## 2013-07-02 DIAGNOSIS — E119 Type 2 diabetes mellitus without complications: Secondary | ICD-10-CM | POA: Diagnosis not present

## 2013-07-02 DIAGNOSIS — Z961 Presence of intraocular lens: Secondary | ICD-10-CM | POA: Diagnosis not present

## 2013-07-02 DIAGNOSIS — H2510 Age-related nuclear cataract, unspecified eye: Secondary | ICD-10-CM | POA: Diagnosis not present

## 2013-10-23 ENCOUNTER — Ambulatory Visit (HOSPITAL_COMMUNITY)
Admission: RE | Admit: 2013-10-23 | Discharge: 2013-10-23 | Disposition: A | Discharge status: Home / Self Care | Payer: Medicare Other | Source: Ambulatory Visit | Attending: Vascular Surgery | Admitting: Vascular Surgery

## 2013-10-23 ENCOUNTER — Other Ambulatory Visit (HOSPITAL_COMMUNITY): Payer: Self-pay | Admitting: Internal Medicine

## 2013-10-23 DIAGNOSIS — R52 Pain, unspecified: Secondary | ICD-10-CM

## 2013-10-23 DIAGNOSIS — M1990 Unspecified osteoarthritis, unspecified site: Secondary | ICD-10-CM | POA: Diagnosis not present

## 2013-10-23 DIAGNOSIS — I10 Essential (primary) hypertension: Secondary | ICD-10-CM | POA: Diagnosis not present

## 2013-10-23 DIAGNOSIS — Z6836 Body mass index (BMI) 36.0-36.9, adult: Secondary | ICD-10-CM | POA: Diagnosis not present

## 2013-10-23 DIAGNOSIS — M79609 Pain in unspecified limb: Secondary | ICD-10-CM | POA: Diagnosis not present

## 2013-11-10 DIAGNOSIS — B0051 Herpesviral iridocyclitis: Secondary | ICD-10-CM | POA: Diagnosis not present

## 2013-11-10 DIAGNOSIS — E1139 Type 2 diabetes mellitus with other diabetic ophthalmic complication: Secondary | ICD-10-CM | POA: Diagnosis not present

## 2013-11-10 DIAGNOSIS — E11311 Type 2 diabetes mellitus with unspecified diabetic retinopathy with macular edema: Secondary | ICD-10-CM | POA: Diagnosis not present

## 2013-11-10 DIAGNOSIS — E11329 Type 2 diabetes mellitus with mild nonproliferative diabetic retinopathy without macular edema: Secondary | ICD-10-CM | POA: Diagnosis not present

## 2013-11-10 DIAGNOSIS — H35359 Cystoid macular degeneration, unspecified eye: Secondary | ICD-10-CM | POA: Diagnosis not present

## 2013-11-10 DIAGNOSIS — H3520 Other non-diabetic proliferative retinopathy, unspecified eye: Secondary | ICD-10-CM | POA: Diagnosis not present

## 2013-11-10 DIAGNOSIS — H3020 Posterior cyclitis, unspecified eye: Secondary | ICD-10-CM | POA: Diagnosis not present

## 2013-11-10 DIAGNOSIS — H2010 Chronic iridocyclitis, unspecified eye: Secondary | ICD-10-CM | POA: Diagnosis not present

## 2014-01-11 DIAGNOSIS — I10 Essential (primary) hypertension: Secondary | ICD-10-CM | POA: Diagnosis not present

## 2014-01-11 DIAGNOSIS — E669 Obesity, unspecified: Secondary | ICD-10-CM | POA: Diagnosis not present

## 2014-01-11 DIAGNOSIS — H209 Unspecified iridocyclitis: Secondary | ICD-10-CM | POA: Diagnosis not present

## 2014-01-11 DIAGNOSIS — M79604 Pain in right leg: Secondary | ICD-10-CM | POA: Diagnosis not present

## 2014-01-11 DIAGNOSIS — E1165 Type 2 diabetes mellitus with hyperglycemia: Secondary | ICD-10-CM | POA: Diagnosis not present

## 2014-01-11 DIAGNOSIS — E039 Hypothyroidism, unspecified: Secondary | ICD-10-CM | POA: Diagnosis not present

## 2014-01-11 DIAGNOSIS — Z6836 Body mass index (BMI) 36.0-36.9, adult: Secondary | ICD-10-CM | POA: Diagnosis not present

## 2014-01-11 DIAGNOSIS — E785 Hyperlipidemia, unspecified: Secondary | ICD-10-CM | POA: Diagnosis not present

## 2014-01-11 DIAGNOSIS — Z23 Encounter for immunization: Secondary | ICD-10-CM | POA: Diagnosis not present

## 2014-01-12 DIAGNOSIS — E785 Hyperlipidemia, unspecified: Secondary | ICD-10-CM | POA: Diagnosis not present

## 2014-01-12 DIAGNOSIS — E039 Hypothyroidism, unspecified: Secondary | ICD-10-CM | POA: Diagnosis not present

## 2014-01-12 DIAGNOSIS — I10 Essential (primary) hypertension: Secondary | ICD-10-CM | POA: Diagnosis not present

## 2014-01-19 DIAGNOSIS — Z1211 Encounter for screening for malignant neoplasm of colon: Secondary | ICD-10-CM | POA: Diagnosis not present

## 2014-01-19 DIAGNOSIS — Z800 Family history of malignant neoplasm of digestive organs: Secondary | ICD-10-CM | POA: Diagnosis not present

## 2014-04-06 DIAGNOSIS — H2011 Chronic iridocyclitis, right eye: Secondary | ICD-10-CM | POA: Diagnosis not present

## 2014-04-06 DIAGNOSIS — H3021 Posterior cyclitis, right eye: Secondary | ICD-10-CM | POA: Diagnosis not present

## 2014-04-06 DIAGNOSIS — B0051 Herpesviral iridocyclitis: Secondary | ICD-10-CM | POA: Diagnosis not present

## 2014-04-06 DIAGNOSIS — H3523 Other non-diabetic proliferative retinopathy, bilateral: Secondary | ICD-10-CM | POA: Diagnosis not present

## 2014-05-25 DIAGNOSIS — E785 Hyperlipidemia, unspecified: Secondary | ICD-10-CM | POA: Diagnosis not present

## 2014-05-25 DIAGNOSIS — Z008 Encounter for other general examination: Secondary | ICD-10-CM | POA: Diagnosis not present

## 2014-05-25 DIAGNOSIS — I10 Essential (primary) hypertension: Secondary | ICD-10-CM | POA: Diagnosis not present

## 2014-05-25 DIAGNOSIS — E039 Hypothyroidism, unspecified: Secondary | ICD-10-CM | POA: Diagnosis not present

## 2014-05-25 DIAGNOSIS — E119 Type 2 diabetes mellitus without complications: Secondary | ICD-10-CM | POA: Diagnosis not present

## 2014-06-01 DIAGNOSIS — Z0000 Encounter for general adult medical examination without abnormal findings: Secondary | ICD-10-CM | POA: Diagnosis not present

## 2014-06-01 DIAGNOSIS — E119 Type 2 diabetes mellitus without complications: Secondary | ICD-10-CM | POA: Diagnosis not present

## 2014-06-01 DIAGNOSIS — H269 Unspecified cataract: Secondary | ICD-10-CM | POA: Diagnosis not present

## 2014-06-01 DIAGNOSIS — K649 Unspecified hemorrhoids: Secondary | ICD-10-CM | POA: Diagnosis not present

## 2014-06-01 DIAGNOSIS — Z6836 Body mass index (BMI) 36.0-36.9, adult: Secondary | ICD-10-CM | POA: Diagnosis not present

## 2014-06-01 DIAGNOSIS — I517 Cardiomegaly: Secondary | ICD-10-CM | POA: Diagnosis not present

## 2014-06-01 DIAGNOSIS — E669 Obesity, unspecified: Secondary | ICD-10-CM | POA: Diagnosis not present

## 2014-06-01 DIAGNOSIS — Z1389 Encounter for screening for other disorder: Secondary | ICD-10-CM | POA: Diagnosis not present

## 2014-06-01 DIAGNOSIS — H209 Unspecified iridocyclitis: Secondary | ICD-10-CM | POA: Diagnosis not present

## 2014-06-01 DIAGNOSIS — B351 Tinea unguium: Secondary | ICD-10-CM | POA: Diagnosis not present

## 2014-06-01 DIAGNOSIS — Z23 Encounter for immunization: Secondary | ICD-10-CM | POA: Diagnosis not present

## 2014-06-01 DIAGNOSIS — K5900 Constipation, unspecified: Secondary | ICD-10-CM | POA: Diagnosis not present

## 2014-06-08 DIAGNOSIS — Z1212 Encounter for screening for malignant neoplasm of rectum: Secondary | ICD-10-CM | POA: Diagnosis not present

## 2014-06-16 ENCOUNTER — Ambulatory Visit: Payer: Medicare Other | Admitting: Podiatry

## 2014-06-23 DIAGNOSIS — Z961 Presence of intraocular lens: Secondary | ICD-10-CM | POA: Diagnosis not present

## 2014-06-23 DIAGNOSIS — E11329 Type 2 diabetes mellitus with mild nonproliferative diabetic retinopathy without macular edema: Secondary | ICD-10-CM | POA: Diagnosis not present

## 2014-06-23 DIAGNOSIS — H25812 Combined forms of age-related cataract, left eye: Secondary | ICD-10-CM | POA: Diagnosis not present

## 2014-06-24 ENCOUNTER — Ambulatory Visit (INDEPENDENT_AMBULATORY_CARE_PROVIDER_SITE_OTHER): Payer: Medicare Other | Admitting: Podiatry

## 2014-06-24 ENCOUNTER — Encounter: Payer: Self-pay | Admitting: Podiatry

## 2014-06-24 VITALS — BP 143/96 | HR 80 | Resp 12

## 2014-06-24 DIAGNOSIS — M2010 Hallux valgus (acquired), unspecified foot: Secondary | ICD-10-CM

## 2014-06-24 DIAGNOSIS — M2041 Other hammer toe(s) (acquired), right foot: Secondary | ICD-10-CM

## 2014-06-24 DIAGNOSIS — B351 Tinea unguium: Secondary | ICD-10-CM | POA: Diagnosis not present

## 2014-06-24 NOTE — Progress Notes (Signed)
Subjective:     Patient ID: Tabitha Moore, female   DOB: 01-Jan-1941, 74 y.o.   MRN: [id number]  HPI patient states she has discoloration in her left big toenail and she does have diabetes and also structural bunion deformity right over left with hammertoe deformity. She is concerned about these conditions and wants to know what she can do about the discoloration   Review of Systems  All other systems reviewed and are negative.      Objective:   Physical Exam  Constitutional: She is oriented to person, place, and time.  Cardiovascular: Intact distal pulses.   Musculoskeletal: Normal range of motion.  Neurological: She is oriented to person, place, and time.  Skin: Skin is warm and dry.  Nursing note and vitals reviewed.  neurovascular status found to be mildly diminished but intact with hair growth adequate and mild edema in the ankle region bilateral with varicosities. Patient is noted to have these left hallux lateral side in the distal one third that is not painful currently but she feels like it's growing. She has hyperostosis medial aspect first metatarsal right over left with mild redness and pain between the hallux and second toe she wears too tight of shoes. 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 Today I recommended formulas 3 which was dispensed and discussed shoe gear modification to try to control bunion and hammertoe deformity. She will be seen back as needed

## 2014-06-24 NOTE — Progress Notes (Signed)
   Subjective:    Patient ID: Tabitha Moore, female    DOB: 1941-01-06, 74 y.o.   MRN: [id number]  HPI  PT STATED LT FOOT GREAT TOENAIL HAVE DISCOLORATION FOR 6 YEARS. THE TOENAIL IS LOOKING WORSE AND DISCOLORATION IS SPREADING. TRIED SOAK WITH WARM WATER IT HELP SOME.  ALSO, TOENAIL TRIM.  Review of Systems  HENT: Positive for hearing loss.   All other systems reviewed and are negative.      Objective:   Physical Exam        Assessment & Plan:

## 2014-07-15 DIAGNOSIS — Z803 Family history of malignant neoplasm of breast: Secondary | ICD-10-CM | POA: Diagnosis not present

## 2014-07-15 DIAGNOSIS — Z1231 Encounter for screening mammogram for malignant neoplasm of breast: Secondary | ICD-10-CM | POA: Diagnosis not present

## 2014-07-20 DIAGNOSIS — R928 Other abnormal and inconclusive findings on diagnostic imaging of breast: Secondary | ICD-10-CM | POA: Diagnosis not present

## 2014-07-20 DIAGNOSIS — N6489 Other specified disorders of breast: Secondary | ICD-10-CM | POA: Diagnosis not present

## 2014-07-20 DIAGNOSIS — Z0000 Encounter for general adult medical examination without abnormal findings: Secondary | ICD-10-CM | POA: Diagnosis not present

## 2014-07-20 DIAGNOSIS — Z803 Family history of malignant neoplasm of breast: Secondary | ICD-10-CM | POA: Diagnosis not present

## 2014-07-20 DIAGNOSIS — N63 Unspecified lump in breast: Secondary | ICD-10-CM | POA: Diagnosis not present

## 2014-07-20 DIAGNOSIS — D360 Benign neoplasm of lymph nodes: Secondary | ICD-10-CM | POA: Diagnosis not present

## 2014-07-26 ENCOUNTER — Other Ambulatory Visit: Payer: Self-pay | Admitting: Radiology

## 2014-07-26 DIAGNOSIS — D360 Benign neoplasm of lymph nodes: Secondary | ICD-10-CM | POA: Diagnosis not present

## 2014-07-26 DIAGNOSIS — N6489 Other specified disorders of breast: Secondary | ICD-10-CM | POA: Diagnosis not present

## 2014-07-26 DIAGNOSIS — Z0000 Encounter for general adult medical examination without abnormal findings: Secondary | ICD-10-CM | POA: Diagnosis not present

## 2014-07-26 DIAGNOSIS — N63 Unspecified lump in breast: Secondary | ICD-10-CM | POA: Diagnosis not present

## 2014-08-13 ENCOUNTER — Other Ambulatory Visit: Payer: Self-pay | Admitting: General Surgery

## 2014-08-13 DIAGNOSIS — N6022 Fibroadenosis of left breast: Secondary | ICD-10-CM | POA: Diagnosis not present

## 2014-08-25 ENCOUNTER — Encounter (HOSPITAL_BASED_OUTPATIENT_CLINIC_OR_DEPARTMENT_OTHER): Payer: Self-pay | Admitting: *Deleted

## 2014-08-25 NOTE — Progress Notes (Signed)
Coming tomorrow for BMET and EKG  before seed at [facility]. Bring all medications.

## 2014-08-26 ENCOUNTER — Encounter (HOSPITAL_BASED_OUTPATIENT_CLINIC_OR_DEPARTMENT_OTHER)
Admission: RE | Admit: 2014-08-26 | Discharge: 2014-08-26 | Disposition: A | Discharge status: Home / Self Care | Payer: Medicare Other | Source: Ambulatory Visit | Attending: General Surgery | Admitting: General Surgery

## 2014-08-26 DIAGNOSIS — I10 Essential (primary) hypertension: Secondary | ICD-10-CM | POA: Diagnosis not present

## 2014-08-26 DIAGNOSIS — N6022 Fibroadenosis of left breast: Secondary | ICD-10-CM | POA: Diagnosis not present

## 2014-08-26 DIAGNOSIS — E119 Type 2 diabetes mellitus without complications: Secondary | ICD-10-CM | POA: Diagnosis not present

## 2014-08-26 DIAGNOSIS — D360 Benign neoplasm of lymph nodes: Secondary | ICD-10-CM | POA: Diagnosis not present

## 2014-08-26 DIAGNOSIS — R921 Mammographic calcification found on diagnostic imaging of breast: Secondary | ICD-10-CM | POA: Diagnosis not present

## 2014-08-26 DIAGNOSIS — G4730 Sleep apnea, unspecified: Secondary | ICD-10-CM | POA: Diagnosis not present

## 2014-08-26 DIAGNOSIS — N63 Unspecified lump in breast: Secondary | ICD-10-CM | POA: Diagnosis not present

## 2014-08-26 DIAGNOSIS — Z803 Family history of malignant neoplasm of breast: Secondary | ICD-10-CM | POA: Diagnosis not present

## 2014-08-26 DIAGNOSIS — Z0000 Encounter for general adult medical examination without abnormal findings: Secondary | ICD-10-CM | POA: Diagnosis not present

## 2014-08-26 DIAGNOSIS — E780 Pure hypercholesterolemia: Secondary | ICD-10-CM | POA: Diagnosis not present

## 2014-08-26 DIAGNOSIS — I517 Cardiomegaly: Secondary | ICD-10-CM | POA: Diagnosis not present

## 2014-08-26 DIAGNOSIS — Z6835 Body mass index (BMI) 35.0-35.9, adult: Secondary | ICD-10-CM | POA: Diagnosis not present

## 2014-08-26 DIAGNOSIS — E039 Hypothyroidism, unspecified: Secondary | ICD-10-CM | POA: Diagnosis not present

## 2014-08-26 DIAGNOSIS — Z7952 Long term (current) use of systemic steroids: Secondary | ICD-10-CM | POA: Diagnosis not present

## 2014-08-26 DIAGNOSIS — Z79899 Other long term (current) drug therapy: Secondary | ICD-10-CM | POA: Diagnosis not present

## 2014-08-26 DIAGNOSIS — N6489 Other specified disorders of breast: Secondary | ICD-10-CM | POA: Diagnosis not present

## 2014-08-26 LAB — BASIC METABOLIC PANEL
Anion gap: 8 (ref 5–15)
BUN: 12 mg/dL (ref 6–20)
CO2: 29 mmol/L (ref 22–32)
Calcium: 9.1 mg/dL (ref 8.9–10.3)
Chloride: 103 mmol/L (ref 101–111)
Creatinine, Ser: 1.09 mg/dL — ABNORMAL HIGH (ref 0.44–1.00)
GFR calc Af Amer: 56 mL/min — ABNORMAL LOW (ref >60)
GFR calc non Af Amer: 48 mL/min — ABNORMAL LOW (ref >60)
Glucose, Bld: 173 mg/dL — ABNORMAL HIGH (ref 65–99)
Potassium: 5.5 mmol/L — ABNORMAL HIGH (ref 3.5–5.1)
Sodium: 140 mmol/L (ref 135–145)

## 2014-08-27 ENCOUNTER — Ambulatory Visit (HOSPITAL_BASED_OUTPATIENT_CLINIC_OR_DEPARTMENT_OTHER): Payer: Medicare Other | Admitting: Anesthesiology

## 2014-08-27 ENCOUNTER — Encounter (HOSPITAL_BASED_OUTPATIENT_CLINIC_OR_DEPARTMENT_OTHER): Payer: Self-pay | Admitting: *Deleted

## 2014-08-27 ENCOUNTER — Ambulatory Visit (HOSPITAL_BASED_OUTPATIENT_CLINIC_OR_DEPARTMENT_OTHER)
Admission: RE | Admit: 2014-08-27 | Discharge: 2014-08-27 | Disposition: A | Discharge status: Home / Self Care | Payer: Medicare Other | Source: Ambulatory Visit | Attending: General Surgery | Admitting: General Surgery

## 2014-08-27 ENCOUNTER — Encounter (HOSPITAL_BASED_OUTPATIENT_CLINIC_OR_DEPARTMENT_OTHER)
Admission: RE | Disposition: A | Discharge status: Home / Self Care | Payer: Self-pay | Source: Ambulatory Visit | Attending: General Surgery

## 2014-08-27 DIAGNOSIS — R921 Mammographic calcification found on diagnostic imaging of breast: Secondary | ICD-10-CM | POA: Insufficient documentation

## 2014-08-27 DIAGNOSIS — Z803 Family history of malignant neoplasm of breast: Secondary | ICD-10-CM | POA: Insufficient documentation

## 2014-08-27 DIAGNOSIS — E039 Hypothyroidism, unspecified: Secondary | ICD-10-CM | POA: Insufficient documentation

## 2014-08-27 DIAGNOSIS — I10 Essential (primary) hypertension: Secondary | ICD-10-CM | POA: Insufficient documentation

## 2014-08-27 DIAGNOSIS — N6022 Fibroadenosis of left breast: Secondary | ICD-10-CM | POA: Diagnosis not present

## 2014-08-27 DIAGNOSIS — D360 Benign neoplasm of lymph nodes: Secondary | ICD-10-CM | POA: Diagnosis not present

## 2014-08-27 DIAGNOSIS — E119 Type 2 diabetes mellitus without complications: Secondary | ICD-10-CM | POA: Insufficient documentation

## 2014-08-27 DIAGNOSIS — G4730 Sleep apnea, unspecified: Secondary | ICD-10-CM | POA: Insufficient documentation

## 2014-08-27 DIAGNOSIS — Z79899 Other long term (current) drug therapy: Secondary | ICD-10-CM | POA: Insufficient documentation

## 2014-08-27 DIAGNOSIS — I517 Cardiomegaly: Secondary | ICD-10-CM | POA: Insufficient documentation

## 2014-08-27 DIAGNOSIS — Z6835 Body mass index (BMI) 35.0-35.9, adult: Secondary | ICD-10-CM | POA: Insufficient documentation

## 2014-08-27 DIAGNOSIS — N6032 Fibrosclerosis of left breast: Secondary | ICD-10-CM | POA: Diagnosis not present

## 2014-08-27 DIAGNOSIS — E780 Pure hypercholesterolemia: Secondary | ICD-10-CM | POA: Insufficient documentation

## 2014-08-27 DIAGNOSIS — Z7952 Long term (current) use of systemic steroids: Secondary | ICD-10-CM | POA: Insufficient documentation

## 2014-08-27 DIAGNOSIS — N6489 Other specified disorders of breast: Secondary | ICD-10-CM | POA: Diagnosis not present

## 2014-08-27 DIAGNOSIS — Z0000 Encounter for general adult medical examination without abnormal findings: Secondary | ICD-10-CM | POA: Diagnosis not present

## 2014-08-27 DIAGNOSIS — N63 Unspecified lump in breast: Secondary | ICD-10-CM | POA: Diagnosis not present

## 2014-08-27 HISTORY — PX: BREAST LUMPECTOMY WITH RADIOACTIVE SEED LOCALIZATION: SHX6424

## 2014-08-27 HISTORY — DX: Adverse effect of unspecified anesthetic, initial encounter: T41.45XA

## 2014-08-27 HISTORY — DX: Other complications of anesthesia, initial encounter: T88.59XA

## 2014-08-27 HISTORY — DX: Hypothyroidism, unspecified: E03.9

## 2014-08-27 LAB — POCT HEMOGLOBIN-HEMACUE: Hemoglobin: 13 g/dL (ref 12.0–15.0)

## 2014-08-27 LAB — GLUCOSE, CAPILLARY
Glucose-Capillary: 126 mg/dL — ABNORMAL HIGH (ref 65–99)
Glucose-Capillary: 113 mg/dL — ABNORMAL HIGH (ref 65–99)

## 2014-08-27 SURGERY — BREAST LUMPECTOMY WITH RADIOACTIVE SEED LOCALIZATION
Anesthesia: General | Site: Breast | Laterality: Left

## 2014-08-27 MED ORDER — OXYCODONE HCL 5 MG PO TABS
ORAL_TABLET | ORAL | Status: AC
  Filled 2014-08-27: qty 1

## 2014-08-27 MED ORDER — CEFAZOLIN SODIUM-DEXTROSE 2-3 GM-% IV SOLR
2.0000 g | INTRAVENOUS | Status: AC
  Administered 2014-08-27: 2 g via INTRAVENOUS

## 2014-08-27 MED ORDER — OXYCODONE HCL 5 MG PO TABS
5.0000 mg | ORAL_TABLET | Freq: Once | ORAL | Status: AC | PRN
  Administered 2014-08-27: 5 mg via ORAL

## 2014-08-27 MED ORDER — CHLORHEXIDINE GLUCONATE 4 % EX LIQD: 1.0000 "application " | Freq: Once | CUTANEOUS | Status: DC

## 2014-08-27 MED ORDER — HYDROMORPHONE HCL 1 MG/ML IJ SOLN
INTRAMUSCULAR | Status: AC
  Filled 2014-08-27: qty 1

## 2014-08-27 MED ORDER — MEPERIDINE HCL 25 MG/ML IJ SOLN: 6.2500 mg | INTRAMUSCULAR | Status: DC | PRN

## 2014-08-27 MED ORDER — GLYCOPYRROLATE 0.2 MG/ML IJ SOLN: 0.2000 mg | Freq: Once | INTRAMUSCULAR | Status: DC | PRN

## 2014-08-27 MED ORDER — ONDANSETRON HCL 4 MG/2ML IJ SOLN
INTRAMUSCULAR | Status: DC | PRN
  Administered 2014-08-27: 4 mg via INTRAVENOUS

## 2014-08-27 MED ORDER — DEXAMETHASONE SODIUM PHOSPHATE 4 MG/ML IJ SOLN
INTRAMUSCULAR | Status: DC | PRN
  Administered 2014-08-27: 8 mg via INTRAVENOUS

## 2014-08-27 MED ORDER — MIDAZOLAM HCL 2 MG/2ML IJ SOLN
1.0000 mg | INTRAMUSCULAR | Status: DC | PRN
  Administered 2014-08-27: 2 mg via INTRAVENOUS

## 2014-08-27 MED ORDER — FENTANYL CITRATE (PF) 100 MCG/2ML IJ SOLN
INTRAMUSCULAR | Status: DC | PRN
  Administered 2014-08-27: 100 ug via INTRAVENOUS

## 2014-08-27 MED ORDER — OXYCODONE HCL 5 MG/5ML PO SOLN: 5.0000 mg | Freq: Once | ORAL | Status: AC | PRN

## 2014-08-27 MED ORDER — PROPOFOL 10 MG/ML IV BOLUS
INTRAVENOUS | Status: DC | PRN
  Administered 2014-08-27: 200 mg via INTRAVENOUS

## 2014-08-27 MED ORDER — FENTANYL CITRATE (PF) 100 MCG/2ML IJ SOLN
INTRAMUSCULAR | Status: AC
  Filled 2014-08-27: qty 6

## 2014-08-27 MED ORDER — LIDOCAINE HCL (CARDIAC) 20 MG/ML IV SOLN
INTRAVENOUS | Status: DC | PRN
  Administered 2014-08-27: 50 mg via INTRAVENOUS

## 2014-08-27 MED ORDER — CEFAZOLIN SODIUM-DEXTROSE 2-3 GM-% IV SOLR
INTRAVENOUS | Status: AC
  Filled 2014-08-27: qty 50

## 2014-08-27 MED ORDER — PROPOFOL 500 MG/50ML IV EMUL
INTRAVENOUS | Status: AC
  Filled 2014-08-27: qty 150

## 2014-08-27 MED ORDER — MIDAZOLAM HCL 2 MG/2ML IJ SOLN
INTRAMUSCULAR | Status: AC
  Filled 2014-08-27: qty 2

## 2014-08-27 MED ORDER — BUPIVACAINE-EPINEPHRINE (PF) 0.25% -1:200000 IJ SOLN
INTRAMUSCULAR | Status: AC
  Filled 2014-08-27: qty 30

## 2014-08-27 MED ORDER — HYDROMORPHONE HCL 1 MG/ML IJ SOLN
0.2500 mg | INTRAMUSCULAR | Status: DC | PRN
  Administered 2014-08-27 (×2): 0.5 mg via INTRAVENOUS

## 2014-08-27 MED ORDER — BUPIVACAINE-EPINEPHRINE (PF) 0.25% -1:200000 IJ SOLN
INTRAMUSCULAR | Status: DC | PRN
  Administered 2014-08-27: 20 mL

## 2014-08-27 MED ORDER — FENTANYL CITRATE (PF) 100 MCG/2ML IJ SOLN: 50.0000 ug | INTRAMUSCULAR | Status: DC | PRN

## 2014-08-27 MED ORDER — OXYCODONE-ACETAMINOPHEN 5-325 MG PO TABS: 1.0000 | ORAL_TABLET | ORAL | Status: DC | PRN

## 2014-08-27 MED ORDER — LACTATED RINGERS IV SOLN
INTRAVENOUS | Status: DC
  Administered 2014-08-27 (×2): via INTRAVENOUS

## 2014-08-27 SURGICAL SUPPLY — 45 items
APPLIER CLIP 9.375 MED OPEN (MISCELLANEOUS)
APR CLP MED 9.3 20 MLT OPN (MISCELLANEOUS)
BLADE SURG 15 STRL LF DISP TIS (BLADE) ×1 IMPLANT
BLADE SURG 15 STRL SS (BLADE) ×2
CANISTER SUC SOCK COL 7IN (MISCELLANEOUS) ×1 IMPLANT
CANISTER SUCT 1200ML W/VALVE (MISCELLANEOUS) ×2 IMPLANT
CHLORAPREP W/TINT 26ML (MISCELLANEOUS) ×2 IMPLANT
CLIP APPLIE 9.375 MED OPEN (MISCELLANEOUS) ×1 IMPLANT
COVER BACK TABLE 60X90IN (DRAPES) ×2 IMPLANT
COVER MAYO STAND STRL (DRAPES) ×2 IMPLANT
COVER PROBE W GEL 5X96 (DRAPES) ×2 IMPLANT
DECANTER SPIKE VIAL GLASS SM (MISCELLANEOUS) IMPLANT
DEVICE DUBIN W/COMP PLATE 8390 (MISCELLANEOUS) ×2 IMPLANT
DRAPE LAPAROSCOPIC ABDOMINAL (DRAPES) ×1 IMPLANT
DRAPE UTILITY XL STRL (DRAPES) ×2 IMPLANT
ELECT COATED BLADE 2.86 ST (ELECTRODE) ×2 IMPLANT
ELECT REM PT RETURN 9FT ADLT (ELECTROSURGICAL) ×2
ELECTRODE REM PT RTRN 9FT ADLT (ELECTROSURGICAL) ×1 IMPLANT
GLOVE BIO SURGEON STRL SZ7.5 (GLOVE) ×3 IMPLANT
GLOVE BIOGEL PI IND STRL 7.0 (GLOVE) IMPLANT
GLOVE BIOGEL PI IND STRL 7.5 (GLOVE) IMPLANT
GLOVE BIOGEL PI INDICATOR 7.0 (GLOVE) ×1
GLOVE BIOGEL PI INDICATOR 7.5 (GLOVE) ×1
GLOVE ECLIPSE 6.5 STRL STRAW (GLOVE) ×1 IMPLANT
GLOVE EXAM NITRILE EXT CUFF MD (GLOVE) ×1 IMPLANT
GLOVE SURG SS PI 7.5 STRL IVOR (GLOVE) ×1 IMPLANT
GOWN STRL REUS W/ TWL LRG LVL3 (GOWN DISPOSABLE) ×2 IMPLANT
GOWN STRL REUS W/TWL LRG LVL3 (GOWN DISPOSABLE) ×6
KIT MARKER MARGIN INK (KITS) ×2 IMPLANT
LIQUID BAND (GAUZE/BANDAGES/DRESSINGS) ×2 IMPLANT
NDL HYPO 25X1 1.5 SAFETY (NEEDLE) IMPLANT
NEEDLE HYPO 25X1 1.5 SAFETY (NEEDLE) ×2 IMPLANT
NS IRRIG 1000ML POUR BTL (IV SOLUTION) ×1 IMPLANT
PACK BASIN DAY SURGERY FS (CUSTOM PROCEDURE TRAY) ×2 IMPLANT
PENCIL BUTTON HOLSTER BLD 10FT (ELECTRODE) ×2 IMPLANT
SLEEVE SCD COMPRESS KNEE MED (MISCELLANEOUS) ×2 IMPLANT
SPONGE LAP 18X18 X RAY DECT (DISPOSABLE) ×2 IMPLANT
SUT MON AB 4-0 PC3 18 (SUTURE) IMPLANT
SUT SILK 2 0 SH (SUTURE) IMPLANT
SUT VICRYL 3-0 CR8 SH (SUTURE) ×2 IMPLANT
SYR CONTROL 10ML LL (SYRINGE) ×1 IMPLANT
TOWEL OR 17X24 6PK STRL BLUE (TOWEL DISPOSABLE) ×2 IMPLANT
TOWEL OR NON WOVEN STRL DISP B (DISPOSABLE) ×1 IMPLANT
TUBE CONNECTING 20X1/4 (TUBING) ×2 IMPLANT
YANKAUER SUCT BULB TIP NO VENT (SUCTIONS) ×1 IMPLANT

## 2014-08-27 NOTE — Anesthesia Preprocedure Evaluation (Signed)
Anesthesia Evaluation  Patient identified by MRN, date of birth, ID band Patient awake    Reviewed: Allergy & Precautions, NPO status , Patient's Chart, lab work & pertinent test results  Airway Mallampati: I  TM Distance: >3 FB Neck ROM: Full    Dental  (+) Teeth Intact, Dental Advisory Given   Pulmonary  breath sounds clear to auscultation        Cardiovascular hypertension, Pt. on medications Rhythm:Regular Rate:Normal     Neuro/Psych    GI/Hepatic   Endo/Other  diabetes, Well Controlled, Type 2, Oral Hypoglycemic AgentsHypothyroidism Morbid obesity  Renal/GU      Musculoskeletal   Abdominal   Peds  Hematology   Anesthesia Other Findings   Reproductive/Obstetrics                             Anesthesia Physical Anesthesia Plan  ASA: II  Anesthesia Plan: General   Post-op Pain Management:    Induction: Intravenous  Airway Management Planned: LMA  Additional Equipment:   Intra-op Plan:   Post-operative Plan: Extubation in OR  Informed Consent: I have reviewed the patients History and Physical, chart, labs and discussed the procedure including the risks, benefits and alternatives for the proposed anesthesia with the patient or authorized representative who has indicated his/her understanding and acceptance.   Dental advisory given  Plan Discussed with: CRNA, Anesthesiologist and Surgeon  Anesthesia Plan Comments:         Anesthesia Quick Evaluation

## 2014-08-27 NOTE — Transfer of Care (Signed)
Immediate Anesthesia Transfer of Care Note  Patient: Tabitha Moore  Procedure(s) Performed: Procedure(s): BREAST LUMPECTOMY WITH RADIOACTIVE SEED LOCALIZATION (Left)  Patient Location: PACU  Anesthesia Type:General  Level of Consciousness: awake, alert  and oriented  Airway & Oxygen Therapy: Patient Spontanous Breathing and Patient connected to face mask oxygen  Post-op Assessment: Report given to RN and Post -op Vital signs reviewed and stable  Post vital signs: Reviewed and stable  Last Vitals:  Filed Vitals:   08/27/14 1056  BP:   Pulse: 82  Temp:   Resp:     Complications: No apparent anesthesia complications

## 2014-08-27 NOTE — Discharge Instructions (Signed)

## 2014-08-27 NOTE — Anesthesia Procedure Notes (Signed)
Procedure Name: LMA Insertion Date/Time: 08/27/2014 10:02 AM Performed by: Melynda Ripple D Pre-anesthesia Checklist: Patient identified, Emergency Drugs available, Suction available and Patient being monitored Patient Re-evaluated:Patient Re-evaluated prior to inductionOxygen Delivery Method: Circle System Utilized Preoxygenation: Pre-oxygenation with 100% oxygen Intubation Type: IV induction Ventilation: Mask ventilation without difficulty LMA: LMA inserted LMA Size: 4.0 Number of attempts: 1 Airway Equipment and Method: Bite block Placement Confirmation: positive ETCO2 Tube secured with: Tape Dental Injury: Teeth and Oropharynx as per pre-operative assessment

## 2014-08-27 NOTE — H&P (Signed)
Tabitha Moore [facility] 08/13/2014 9:50 AM Location: [facility] Patient #: [id number] DOB: 03/04/1941 Widowed / Language: Cleophus Molt / Race: Black or African American Female  History of Present Illness Tabitha Moore. Tabitha Starks MD; 08/13/2014 10:16 AM) Patient words: left breast eval.  The patient is a 74 year old female who presents with a breast mass. We are asked to see the patient in consultation by Tabitha Moore to evaluate her for a left breast mass. The patient is a 74 year old black female who recently went for a routine screening mammogram. At that time she was found to have an area of distortion in the upper outer quadrant of the left breast. She denies any breast pain or discharge from the nipple. Her only family history of breast cancer is significant in her sister. The area measured between 1 and 2 cm. This was biopsied and came back as a complex sclerosing lesion. A lymph node was also biopsied that was benign.   Other Problems Tabitha Moore, Tabitha Moore; 08/13/2014 9:50 AM) Cholelithiasis Diabetes Mellitus General anesthesia - complications Hemorrhoids High blood pressure Hypercholesterolemia Oophorectomy Bilateral. Sleep Apnea Thyroid Disease  Past Surgical History Tabitha Moore, Tabitha Moore; 08/13/2014 9:50 AM) Breast Biopsy Left. Cataract Surgery Right. Colon Polyp Removal - Colonoscopy Gallbladder Surgery - Laparoscopic Hysterectomy (not due to cancer) - Complete Sentinel Lymph Node Biopsy  Diagnostic Studies History Tabitha Moore, Tabitha Moore; 08/13/2014 9:50 AM) Colonoscopy 5-10 years ago Mammogram within last year  Allergies Tabitha Moore, Tabitha Moore; 08/13/2014 9:51 AM) No Known Drug Allergies05/09/2014  Medication History (Tabitha Moore, Tabitha Moore; 08/13/2014 9:53 AM) Benicar (40MG  Tablet, Oral) Active. Crestor (5MG  Tablet, Oral) Active. Amaryl (4MG  Tablet, Oral) Active. Levothyroxine Sodium (88MCG Tablet, Oral) Active. Pred Forte (1% Suspension, Ophthalmic) Active. Medications  Reconciled  Social History Tabitha Moore, Tabitha Moore; 08/13/2014 9:50 AM) Caffeine use Carbonated beverages, Coffee. No alcohol use No drug use Tobacco use Never smoker.  Family History Tabitha Moore, Tabitha Moore; 08/13/2014 9:50 AM) Arthritis Father, Mother. Breast Cancer Sister. Cerebrovascular Accident Mother. Colon Cancer Family Members In General. Diabetes Mellitus Brother, Sister. Hypertension Sister. Seizure disorder Sister.  Pregnancy / Birth History Tabitha Moore, [place]; 08/13/2014 9:50 AM) Age at menarche 11 years. Age of menopause 11-50 Gravida 0 Para 0  Review of Systems (Tabitha Moore; 08/13/2014 9:50 AM) General Present- Fatigue. Not Present- Appetite Loss, Chills, Fever, Night Sweats, Weight Gain and Weight Loss. Skin Not Present- Change in Wart/Mole, Dryness, Hives, Jaundice, New Lesions, Non-Healing Wounds, Rash and Ulcer. HEENT Present- Seasonal Allergies. Not Present- Earache, Hearing Loss, Hoarseness, Nose Bleed, Oral Ulcers, Ringing in the Ears, Sinus Pain, Sore Throat, Visual Disturbances, Wears glasses/contact lenses and Yellow Eyes. Respiratory Not Present- Bloody sputum, Chronic Cough, Difficulty Breathing, Snoring and Wheezing. Breast Not Present- Breast Mass, Breast Pain, Nipple Discharge and Skin Changes. Cardiovascular Present- Leg Cramps. Not Present- Chest Pain, Difficulty Breathing Lying Down, Palpitations, Rapid Heart Rate, Shortness of Breath and Swelling of Extremities. Gastrointestinal Present- Constipation, Hemorrhoids and Indigestion. Not Present- Abdominal Pain, Bloating, Bloody Stool, Change in Bowel Habits, Chronic diarrhea, Difficulty Swallowing, Excessive gas, Gets full quickly at meals, Nausea, Rectal Pain and Vomiting. Female Genitourinary Not Present- Frequency, Nocturia, Painful Urination, Pelvic Pain and Urgency. Musculoskeletal Present- Joint Pain, Joint Stiffness and Muscle Pain. Not Present- Back Pain, Muscle Weakness and Swelling of  Extremities. Neurological Present- Decreased Memory and Tingling. Not Present- Fainting, Headaches, Numbness, Seizures, Tremor, Trouble walking and Weakness. Psychiatric Present- Depression. Not Present- Anxiety, Bipolar, Change in Sleep Pattern, Fearful and Frequent crying. Endocrine Present- Hair Changes and Hot  flashes. Not Present- Cold Intolerance, Excessive Hunger, Heat Intolerance and New Diabetes. Hematology Present- Easy Bruising and Gland problems. Not Present- Excessive bleeding, HIV and Persistent Infections.   Vitals (Tabitha Moore Tabitha Moore; 08/13/2014 9:51 AM) 08/13/2014 9:51 AM Weight: 203 lb Height: 62in Body Surface Area: 2.01 m Body Mass Index: 37.13 kg/m Temp.: 70F(Temporal)  Pulse: 90 (Regular)  BP: 130/72 (Sitting, Left Arm, Standard)    Physical Exam Tabitha Moore S. Tabitha Starks MD; 08/13/2014 10:16 AM) General Mental Status-Alert. General Appearance-Consistent with stated age. Hydration-Well hydrated. Voice-Normal.  Head and Neck Head-normocephalic, atraumatic with no lesions or palpable masses. Trachea-midline. Thyroid Gland Characteristics - normal size and consistency.  Eye Eyeball - Bilateral-Extraocular movements intact. Sclera/Conjunctiva - Bilateral-No scleral icterus.  Chest and Lung Exam Chest and lung exam reveals -quiet, even and easy respiratory effort with no use of accessory muscles and on auscultation, normal breath sounds, no adventitious sounds and normal vocal resonance. Inspection Chest Wall - Normal. Back - normal.  Breast Note: There is no palpable mass in either breast. There is no palpable axillary, supraclavicular, or cervical lymphadenopathy.   Cardiovascular Cardiovascular examination reveals -normal heart sounds, regular rate and rhythm with no murmurs and normal pedal pulses bilaterally.  Abdomen Inspection Inspection of the abdomen reveals - No Hernias. Skin - Scar - no surgical  scars. Palpation/Percussion Palpation and Percussion of the abdomen reveal - Soft, Non Tender, No Rebound tenderness, No Rigidity (guarding) and No hepatosplenomegaly. Auscultation Auscultation of the abdomen reveals - Bowel sounds normal.  Neurologic Neurologic evaluation reveals -alert and oriented x 3 with no impairment of recent or remote memory. Mental Status-Normal.  Musculoskeletal Normal Exam - Left-Upper Extremity Strength Normal and Lower Extremity Strength Normal. Normal Exam - Right-Upper Extremity Strength Normal and Lower Extremity Strength Normal.  Lymphatic Head & Neck  General Head & Neck Lymphatics: Bilateral - Description - Normal. Axillary  General Axillary Region: Bilateral - Description - Normal. Tenderness - Non Tender. Femoral & Inguinal  Generalized Femoral & Inguinal Lymphatics: Bilateral - Description - Normal. Tenderness - Non Tender.    Assessment & Plan Tabitha Moore S. Tabitha Starks MD; 08/13/2014 10:15 AM) SCLEROSING ADENOSIS OF BREAST, LEFT (610.2  N60.22) Impression: The patient appears to have a sclerosing lesion in the upper outer quadrant of the left breast. Because this is considered a high risk lesion the recommendation would be to have this area removed. I have discussed with her in detail the risks and benefits of the operation to do this as well as some of the technical aspects and she understands and wishes to proceed. I will plan for a left breast radioactive seed localized lumpectomy     Signed by Luella Cook, MD (08/13/2014 10:17 AM)

## 2014-08-27 NOTE — Op Note (Signed)
08/27/2014  10:47 AM  PATIENT:  Tabitha Moore  74 y.o. female  PRE-OPERATIVE DIAGNOSIS:  Left Breast sclerosing lesion  POST-OPERATIVE DIAGNOSIS:  Left Breast sclerosing Lesion  PROCEDURE:  Procedure(s): BREAST LUMPECTOMY WITH RADIOACTIVE SEED LOCALIZATION (Left)  SURGEON:  Surgeon(s) and Role:    * Jovita Kussmaul, MD - Primary  PHYSICIAN ASSISTANT:   ASSISTANTS: none   ANESTHESIA:   general  EBL:  Total I/O In: 1000 [I.V.:1000] Out: -   BLOOD ADMINISTERED:none  DRAINS: none   LOCAL MEDICATIONS USED:  MARCAINE     SPECIMEN:  Source of Specimen:  left breast tissue  DISPOSITION OF SPECIMEN:  PATHOLOGY  COUNTS:  YES  TOURNIQUET:  * No tourniquets in log *  DICTATION: .Dragon Dictation  After informed consent was obtained the patient was brought to the operating room and placed in the supine position on the operating room table. After adequate induction of general anesthesia the patient's left breast was prepped with ChloraPrep, allowed to dry, and draped in usual sterile manner. Previously the patient had an I-125 seed placed in the lower outer quadrant of the left breast mark an area of a sclerosing lesion. The neoprobe was used to identify the area of radioactivity. A radially oriented incision was made overlying the radioactivity with a 15 blade knife. The incision was carried through the skin and subcutaneous tissue sharply with electrocautery until the breast tissue was entered. Using the neoprobe to check the area of radioactivity frequently a circular portion of breast tissue was excised sharply with the electrocautery around the area of radioactivity. Once the specimen was removed it was oriented with the appropriate paint colors. A specimen radiograph showed the seed to be in the center of the specimen. The specimen was then sent to pathology for further evaluation. Hemostasis was achieved using the Bovie electrocautery. The wound was infiltrated with quarter percent  Marcaine. The deep layer of the wound was closed with interrupted 3-0 Vicryl stitches. The skin was then closed with interrupted 4-0 Monocryl subcuticular stitches. Dermabond dressings were applied. The patient tolerated the procedure well. At the end of the case all needle sponge and instrument counts were correct. The patient was then awakened and taken to recovery in stable condition.  PLAN OF CARE: Discharge to home after PACU  PATIENT DISPOSITION:  PACU - hemodynamically stable.   Delay start of Pharmacological VTE agent (>24hrs) due to surgical blood loss or risk of bleeding: not applicable

## 2014-08-27 NOTE — Anesthesia Postprocedure Evaluation (Signed)
  Anesthesia Post-op Note  Patient: Tabitha Moore  Procedure(s) Performed: Procedure(s): BREAST LUMPECTOMY WITH RADIOACTIVE SEED LOCALIZATION (Left)  Patient Location: PACU  Anesthesia Type:General  Level of Consciousness: awake and alert   Airway and Oxygen Therapy: Patient Spontanous Breathing  Post-op Pain: mild  Post-op Assessment: Post-op Vital signs reviewed, Patient's Cardiovascular Status Stable and Respiratory Function Stable  Post-op Vital Signs: Reviewed  Filed Vitals:   08/27/14 1150  BP: 150/71  Pulse: 70  Temp: 36.7 C  Resp: 16    Complications: No apparent anesthesia complications

## 2014-08-30 ENCOUNTER — Encounter (HOSPITAL_BASED_OUTPATIENT_CLINIC_OR_DEPARTMENT_OTHER): Payer: Self-pay | Admitting: General Surgery

## 2014-11-30 DIAGNOSIS — E669 Obesity, unspecified: Secondary | ICD-10-CM | POA: Diagnosis not present

## 2014-11-30 DIAGNOSIS — Z6835 Body mass index (BMI) 35.0-35.9, adult: Secondary | ICD-10-CM | POA: Diagnosis not present

## 2014-11-30 DIAGNOSIS — E119 Type 2 diabetes mellitus without complications: Secondary | ICD-10-CM | POA: Diagnosis not present

## 2014-11-30 DIAGNOSIS — E785 Hyperlipidemia, unspecified: Secondary | ICD-10-CM | POA: Diagnosis not present

## 2014-11-30 DIAGNOSIS — E039 Hypothyroidism, unspecified: Secondary | ICD-10-CM | POA: Diagnosis not present

## 2014-11-30 DIAGNOSIS — M1990 Unspecified osteoarthritis, unspecified site: Secondary | ICD-10-CM | POA: Diagnosis not present

## 2014-11-30 DIAGNOSIS — I10 Essential (primary) hypertension: Secondary | ICD-10-CM | POA: Diagnosis not present

## 2015-01-03 DIAGNOSIS — H25812 Combined forms of age-related cataract, left eye: Secondary | ICD-10-CM | POA: Diagnosis not present

## 2015-01-03 DIAGNOSIS — Z961 Presence of intraocular lens: Secondary | ICD-10-CM | POA: Diagnosis not present

## 2015-01-26 DIAGNOSIS — H25812 Combined forms of age-related cataract, left eye: Secondary | ICD-10-CM | POA: Diagnosis not present

## 2015-01-26 DIAGNOSIS — Z6836 Body mass index (BMI) 36.0-36.9, adult: Secondary | ICD-10-CM | POA: Diagnosis not present

## 2015-01-26 DIAGNOSIS — Z961 Presence of intraocular lens: Secondary | ICD-10-CM | POA: Diagnosis not present

## 2015-01-26 DIAGNOSIS — Z01818 Encounter for other preprocedural examination: Secondary | ICD-10-CM | POA: Diagnosis not present

## 2015-01-26 DIAGNOSIS — Z9841 Cataract extraction status, right eye: Secondary | ICD-10-CM | POA: Diagnosis not present

## 2015-01-26 DIAGNOSIS — Z7982 Long term (current) use of aspirin: Secondary | ICD-10-CM | POA: Diagnosis not present

## 2015-01-26 DIAGNOSIS — I10 Essential (primary) hypertension: Secondary | ICD-10-CM | POA: Diagnosis not present

## 2015-02-03 DIAGNOSIS — E113219 Type 2 diabetes mellitus with mild nonproliferative diabetic retinopathy with macular edema, unspecified eye: Secondary | ICD-10-CM | POA: Diagnosis not present

## 2015-02-03 DIAGNOSIS — Z7982 Long term (current) use of aspirin: Secondary | ICD-10-CM | POA: Diagnosis not present

## 2015-02-03 DIAGNOSIS — E039 Hypothyroidism, unspecified: Secondary | ICD-10-CM | POA: Diagnosis not present

## 2015-02-03 DIAGNOSIS — Z961 Presence of intraocular lens: Secondary | ICD-10-CM | POA: Diagnosis not present

## 2015-02-03 DIAGNOSIS — H2512 Age-related nuclear cataract, left eye: Secondary | ICD-10-CM | POA: Diagnosis not present

## 2015-02-03 DIAGNOSIS — H25812 Combined forms of age-related cataract, left eye: Secondary | ICD-10-CM | POA: Diagnosis not present

## 2015-02-03 DIAGNOSIS — I10 Essential (primary) hypertension: Secondary | ICD-10-CM | POA: Diagnosis not present

## 2015-02-07 DIAGNOSIS — Z23 Encounter for immunization: Secondary | ICD-10-CM | POA: Diagnosis not present

## 2015-02-07 DIAGNOSIS — E1122 Type 2 diabetes mellitus with diabetic chronic kidney disease: Secondary | ICD-10-CM | POA: Diagnosis not present

## 2015-02-07 DIAGNOSIS — I129 Hypertensive chronic kidney disease with stage 1 through stage 4 chronic kidney disease, or unspecified chronic kidney disease: Secondary | ICD-10-CM | POA: Diagnosis not present

## 2015-02-07 DIAGNOSIS — E785 Hyperlipidemia, unspecified: Secondary | ICD-10-CM | POA: Diagnosis not present

## 2015-02-07 DIAGNOSIS — Z6835 Body mass index (BMI) 35.0-35.9, adult: Secondary | ICD-10-CM | POA: Diagnosis not present

## 2015-02-07 DIAGNOSIS — E039 Hypothyroidism, unspecified: Secondary | ICD-10-CM | POA: Diagnosis not present

## 2015-02-07 DIAGNOSIS — E119 Type 2 diabetes mellitus without complications: Secondary | ICD-10-CM | POA: Diagnosis not present

## 2015-02-07 DIAGNOSIS — I10 Essential (primary) hypertension: Secondary | ICD-10-CM | POA: Diagnosis not present

## 2015-02-07 DIAGNOSIS — N183 Chronic kidney disease, stage 3 (moderate): Secondary | ICD-10-CM | POA: Diagnosis not present

## 2015-02-07 DIAGNOSIS — E669 Obesity, unspecified: Secondary | ICD-10-CM | POA: Diagnosis not present

## 2015-03-04 DIAGNOSIS — I10 Essential (primary) hypertension: Secondary | ICD-10-CM | POA: Diagnosis not present

## 2015-03-04 DIAGNOSIS — E1165 Type 2 diabetes mellitus with hyperglycemia: Secondary | ICD-10-CM | POA: Diagnosis not present

## 2015-03-04 DIAGNOSIS — E669 Obesity, unspecified: Secondary | ICD-10-CM | POA: Diagnosis not present

## 2015-03-04 DIAGNOSIS — Z6835 Body mass index (BMI) 35.0-35.9, adult: Secondary | ICD-10-CM | POA: Diagnosis not present

## 2015-05-30 DIAGNOSIS — E784 Other hyperlipidemia: Secondary | ICD-10-CM | POA: Diagnosis not present

## 2015-05-30 DIAGNOSIS — E038 Other specified hypothyroidism: Secondary | ICD-10-CM | POA: Diagnosis not present

## 2015-05-30 DIAGNOSIS — E1165 Type 2 diabetes mellitus with hyperglycemia: Secondary | ICD-10-CM | POA: Diagnosis not present

## 2015-06-06 DIAGNOSIS — E784 Other hyperlipidemia: Secondary | ICD-10-CM | POA: Diagnosis not present

## 2015-06-06 DIAGNOSIS — I517 Cardiomegaly: Secondary | ICD-10-CM | POA: Diagnosis not present

## 2015-06-06 DIAGNOSIS — N183 Chronic kidney disease, stage 3 (moderate): Secondary | ICD-10-CM | POA: Diagnosis not present

## 2015-06-06 DIAGNOSIS — M1990 Unspecified osteoarthritis, unspecified site: Secondary | ICD-10-CM | POA: Diagnosis not present

## 2015-06-06 DIAGNOSIS — E1122 Type 2 diabetes mellitus with diabetic chronic kidney disease: Secondary | ICD-10-CM | POA: Diagnosis not present

## 2015-06-06 DIAGNOSIS — E038 Other specified hypothyroidism: Secondary | ICD-10-CM | POA: Diagnosis not present

## 2015-06-06 DIAGNOSIS — K648 Other hemorrhoids: Secondary | ICD-10-CM | POA: Diagnosis not present

## 2015-06-06 DIAGNOSIS — Z6836 Body mass index (BMI) 36.0-36.9, adult: Secondary | ICD-10-CM | POA: Diagnosis not present

## 2015-06-06 DIAGNOSIS — I129 Hypertensive chronic kidney disease with stage 1 through stage 4 chronic kidney disease, or unspecified chronic kidney disease: Secondary | ICD-10-CM | POA: Diagnosis not present

## 2015-06-06 DIAGNOSIS — B351 Tinea unguium: Secondary | ICD-10-CM | POA: Diagnosis not present

## 2015-06-06 DIAGNOSIS — Z1389 Encounter for screening for other disorder: Secondary | ICD-10-CM | POA: Diagnosis not present

## 2015-06-06 DIAGNOSIS — Z0000 Encounter for general adult medical examination without abnormal findings: Secondary | ICD-10-CM | POA: Diagnosis not present

## 2015-06-07 DIAGNOSIS — Z1389 Encounter for screening for other disorder: Secondary | ICD-10-CM | POA: Diagnosis not present

## 2015-06-07 DIAGNOSIS — N183 Chronic kidney disease, stage 3 (moderate): Secondary | ICD-10-CM | POA: Diagnosis not present

## 2015-06-07 DIAGNOSIS — E1122 Type 2 diabetes mellitus with diabetic chronic kidney disease: Secondary | ICD-10-CM | POA: Diagnosis not present

## 2015-06-07 DIAGNOSIS — B351 Tinea unguium: Secondary | ICD-10-CM | POA: Diagnosis not present

## 2015-06-07 DIAGNOSIS — I129 Hypertensive chronic kidney disease with stage 1 through stage 4 chronic kidney disease, or unspecified chronic kidney disease: Secondary | ICD-10-CM | POA: Diagnosis not present

## 2015-06-07 DIAGNOSIS — E784 Other hyperlipidemia: Secondary | ICD-10-CM | POA: Diagnosis not present

## 2015-06-07 DIAGNOSIS — M1990 Unspecified osteoarthritis, unspecified site: Secondary | ICD-10-CM | POA: Diagnosis not present

## 2015-06-07 DIAGNOSIS — K648 Other hemorrhoids: Secondary | ICD-10-CM | POA: Diagnosis not present

## 2015-06-07 DIAGNOSIS — Z6836 Body mass index (BMI) 36.0-36.9, adult: Secondary | ICD-10-CM | POA: Diagnosis not present

## 2015-06-07 DIAGNOSIS — Z0000 Encounter for general adult medical examination without abnormal findings: Secondary | ICD-10-CM | POA: Diagnosis not present

## 2015-06-07 DIAGNOSIS — E038 Other specified hypothyroidism: Secondary | ICD-10-CM | POA: Diagnosis not present

## 2015-06-07 DIAGNOSIS — I517 Cardiomegaly: Secondary | ICD-10-CM | POA: Diagnosis not present

## 2015-06-21 DIAGNOSIS — K5900 Constipation, unspecified: Secondary | ICD-10-CM | POA: Diagnosis not present

## 2015-06-21 DIAGNOSIS — Z1211 Encounter for screening for malignant neoplasm of colon: Secondary | ICD-10-CM | POA: Diagnosis not present

## 2015-06-29 DIAGNOSIS — Z1211 Encounter for screening for malignant neoplasm of colon: Secondary | ICD-10-CM | POA: Diagnosis not present

## 2015-06-29 DIAGNOSIS — Z1212 Encounter for screening for malignant neoplasm of rectum: Secondary | ICD-10-CM | POA: Diagnosis not present

## 2015-07-15 DIAGNOSIS — H26492 Other secondary cataract, left eye: Secondary | ICD-10-CM | POA: Diagnosis not present

## 2015-10-04 DIAGNOSIS — H3581 Retinal edema: Secondary | ICD-10-CM | POA: Diagnosis not present

## 2015-10-04 DIAGNOSIS — Z961 Presence of intraocular lens: Secondary | ICD-10-CM | POA: Diagnosis not present

## 2015-10-04 DIAGNOSIS — Z794 Long term (current) use of insulin: Secondary | ICD-10-CM | POA: Diagnosis not present

## 2015-10-04 DIAGNOSIS — E113313 Type 2 diabetes mellitus with moderate nonproliferative diabetic retinopathy with macular edema, bilateral: Secondary | ICD-10-CM | POA: Diagnosis not present

## 2015-10-04 DIAGNOSIS — H30033 Focal chorioretinal inflammation, peripheral, bilateral: Secondary | ICD-10-CM | POA: Diagnosis not present

## 2015-10-10 DIAGNOSIS — H3581 Retinal edema: Secondary | ICD-10-CM | POA: Diagnosis not present

## 2015-10-10 DIAGNOSIS — H30033 Focal chorioretinal inflammation, peripheral, bilateral: Secondary | ICD-10-CM | POA: Diagnosis not present

## 2015-10-25 DIAGNOSIS — E113313 Type 2 diabetes mellitus with moderate nonproliferative diabetic retinopathy with macular edema, bilateral: Secondary | ICD-10-CM | POA: Diagnosis not present

## 2015-10-25 DIAGNOSIS — H3581 Retinal edema: Secondary | ICD-10-CM | POA: Diagnosis not present

## 2015-10-25 DIAGNOSIS — Z794 Long term (current) use of insulin: Secondary | ICD-10-CM | POA: Diagnosis not present

## 2015-10-25 DIAGNOSIS — H30033 Focal chorioretinal inflammation, peripheral, bilateral: Secondary | ICD-10-CM | POA: Diagnosis not present

## 2015-10-25 DIAGNOSIS — Z961 Presence of intraocular lens: Secondary | ICD-10-CM | POA: Diagnosis not present

## 2015-11-15 DIAGNOSIS — E038 Other specified hypothyroidism: Secondary | ICD-10-CM | POA: Diagnosis not present

## 2015-11-15 DIAGNOSIS — N183 Chronic kidney disease, stage 3 (moderate): Secondary | ICD-10-CM | POA: Diagnosis not present

## 2015-11-15 DIAGNOSIS — E1122 Type 2 diabetes mellitus with diabetic chronic kidney disease: Secondary | ICD-10-CM | POA: Diagnosis not present

## 2015-11-15 DIAGNOSIS — Z6834 Body mass index (BMI) 34.0-34.9, adult: Secondary | ICD-10-CM | POA: Diagnosis not present

## 2015-11-15 DIAGNOSIS — I129 Hypertensive chronic kidney disease with stage 1 through stage 4 chronic kidney disease, or unspecified chronic kidney disease: Secondary | ICD-10-CM | POA: Diagnosis not present

## 2015-11-15 DIAGNOSIS — E668 Other obesity: Secondary | ICD-10-CM | POA: Diagnosis not present

## 2015-11-15 DIAGNOSIS — H209 Unspecified iridocyclitis: Secondary | ICD-10-CM | POA: Diagnosis not present

## 2015-11-22 DIAGNOSIS — Z961 Presence of intraocular lens: Secondary | ICD-10-CM | POA: Diagnosis not present

## 2015-11-22 DIAGNOSIS — H30033 Focal chorioretinal inflammation, peripheral, bilateral: Secondary | ICD-10-CM | POA: Diagnosis not present

## 2015-11-22 DIAGNOSIS — H26492 Other secondary cataract, left eye: Secondary | ICD-10-CM | POA: Diagnosis not present

## 2015-11-22 DIAGNOSIS — E113313 Type 2 diabetes mellitus with moderate nonproliferative diabetic retinopathy with macular edema, bilateral: Secondary | ICD-10-CM | POA: Diagnosis not present

## 2015-11-22 DIAGNOSIS — H3581 Retinal edema: Secondary | ICD-10-CM | POA: Diagnosis not present

## 2015-11-22 DIAGNOSIS — Z794 Long term (current) use of insulin: Secondary | ICD-10-CM | POA: Diagnosis not present

## 2015-11-23 DIAGNOSIS — H3581 Retinal edema: Secondary | ICD-10-CM | POA: Diagnosis not present

## 2015-11-23 DIAGNOSIS — H30033 Focal chorioretinal inflammation, peripheral, bilateral: Secondary | ICD-10-CM | POA: Diagnosis not present

## 2016-01-10 DIAGNOSIS — E113313 Type 2 diabetes mellitus with moderate nonproliferative diabetic retinopathy with macular edema, bilateral: Secondary | ICD-10-CM | POA: Diagnosis not present

## 2016-01-10 DIAGNOSIS — Z961 Presence of intraocular lens: Secondary | ICD-10-CM | POA: Diagnosis not present

## 2016-01-10 DIAGNOSIS — Z794 Long term (current) use of insulin: Secondary | ICD-10-CM | POA: Diagnosis not present

## 2016-01-10 DIAGNOSIS — H3581 Retinal edema: Secondary | ICD-10-CM | POA: Diagnosis not present

## 2016-01-10 DIAGNOSIS — H30033 Focal chorioretinal inflammation, peripheral, bilateral: Secondary | ICD-10-CM | POA: Diagnosis not present

## 2016-01-11 DIAGNOSIS — H3581 Retinal edema: Secondary | ICD-10-CM | POA: Diagnosis not present

## 2016-01-11 DIAGNOSIS — H30033 Focal chorioretinal inflammation, peripheral, bilateral: Secondary | ICD-10-CM | POA: Diagnosis not present

## 2016-01-26 DIAGNOSIS — N183 Chronic kidney disease, stage 3 (moderate): Secondary | ICD-10-CM | POA: Diagnosis not present

## 2016-01-26 DIAGNOSIS — I129 Hypertensive chronic kidney disease with stage 1 through stage 4 chronic kidney disease, or unspecified chronic kidney disease: Secondary | ICD-10-CM | POA: Diagnosis not present

## 2016-01-26 DIAGNOSIS — Z23 Encounter for immunization: Secondary | ICD-10-CM | POA: Diagnosis not present

## 2016-01-26 DIAGNOSIS — E038 Other specified hypothyroidism: Secondary | ICD-10-CM | POA: Diagnosis not present

## 2016-01-26 DIAGNOSIS — Z6835 Body mass index (BMI) 35.0-35.9, adult: Secondary | ICD-10-CM | POA: Diagnosis not present

## 2016-01-26 DIAGNOSIS — H209 Unspecified iridocyclitis: Secondary | ICD-10-CM | POA: Diagnosis not present

## 2016-01-26 DIAGNOSIS — E784 Other hyperlipidemia: Secondary | ICD-10-CM | POA: Diagnosis not present

## 2016-01-26 DIAGNOSIS — E668 Other obesity: Secondary | ICD-10-CM | POA: Diagnosis not present

## 2016-01-26 DIAGNOSIS — E1122 Type 2 diabetes mellitus with diabetic chronic kidney disease: Secondary | ICD-10-CM | POA: Diagnosis not present

## 2016-02-28 DIAGNOSIS — H26492 Other secondary cataract, left eye: Secondary | ICD-10-CM | POA: Diagnosis not present

## 2016-02-28 DIAGNOSIS — Z794 Long term (current) use of insulin: Secondary | ICD-10-CM | POA: Diagnosis not present

## 2016-02-28 DIAGNOSIS — Z961 Presence of intraocular lens: Secondary | ICD-10-CM | POA: Diagnosis not present

## 2016-02-28 DIAGNOSIS — E113313 Type 2 diabetes mellitus with moderate nonproliferative diabetic retinopathy with macular edema, bilateral: Secondary | ICD-10-CM | POA: Diagnosis not present

## 2016-02-28 DIAGNOSIS — H30033 Focal chorioretinal inflammation, peripheral, bilateral: Secondary | ICD-10-CM | POA: Diagnosis not present

## 2016-02-28 DIAGNOSIS — H3581 Retinal edema: Secondary | ICD-10-CM | POA: Diagnosis not present

## 2016-02-28 DIAGNOSIS — Z79899 Other long term (current) drug therapy: Secondary | ICD-10-CM | POA: Diagnosis not present

## 2016-04-16 DIAGNOSIS — Z79899 Other long term (current) drug therapy: Secondary | ICD-10-CM | POA: Diagnosis not present

## 2016-04-16 DIAGNOSIS — H30033 Focal chorioretinal inflammation, peripheral, bilateral: Secondary | ICD-10-CM | POA: Diagnosis not present

## 2016-06-07 DIAGNOSIS — E1122 Type 2 diabetes mellitus with diabetic chronic kidney disease: Secondary | ICD-10-CM | POA: Diagnosis not present

## 2016-06-07 DIAGNOSIS — E784 Other hyperlipidemia: Secondary | ICD-10-CM | POA: Diagnosis not present

## 2016-06-07 DIAGNOSIS — I10 Essential (primary) hypertension: Secondary | ICD-10-CM | POA: Diagnosis not present

## 2016-06-07 DIAGNOSIS — E038 Other specified hypothyroidism: Secondary | ICD-10-CM | POA: Diagnosis not present

## 2016-06-11 DIAGNOSIS — N183 Chronic kidney disease, stage 3 (moderate): Secondary | ICD-10-CM | POA: Diagnosis not present

## 2016-06-11 DIAGNOSIS — E038 Other specified hypothyroidism: Secondary | ICD-10-CM | POA: Diagnosis not present

## 2016-06-11 DIAGNOSIS — I129 Hypertensive chronic kidney disease with stage 1 through stage 4 chronic kidney disease, or unspecified chronic kidney disease: Secondary | ICD-10-CM | POA: Diagnosis not present

## 2016-06-11 DIAGNOSIS — E1122 Type 2 diabetes mellitus with diabetic chronic kidney disease: Secondary | ICD-10-CM | POA: Diagnosis not present

## 2016-06-11 DIAGNOSIS — E668 Other obesity: Secondary | ICD-10-CM | POA: Diagnosis not present

## 2016-06-11 DIAGNOSIS — H20819 Fuchs' heterochromic cyclitis, unspecified eye: Secondary | ICD-10-CM | POA: Diagnosis not present

## 2016-06-11 DIAGNOSIS — Z1389 Encounter for screening for other disorder: Secondary | ICD-10-CM | POA: Diagnosis not present

## 2016-06-11 DIAGNOSIS — Z0000 Encounter for general adult medical examination without abnormal findings: Secondary | ICD-10-CM | POA: Diagnosis not present

## 2016-06-11 DIAGNOSIS — I517 Cardiomegaly: Secondary | ICD-10-CM | POA: Diagnosis not present

## 2016-06-11 DIAGNOSIS — M1990 Unspecified osteoarthritis, unspecified site: Secondary | ICD-10-CM | POA: Diagnosis not present

## 2016-06-11 DIAGNOSIS — E784 Other hyperlipidemia: Secondary | ICD-10-CM | POA: Diagnosis not present

## 2016-06-11 DIAGNOSIS — Z6835 Body mass index (BMI) 35.0-35.9, adult: Secondary | ICD-10-CM | POA: Diagnosis not present

## 2016-06-21 ENCOUNTER — Encounter (INDEPENDENT_AMBULATORY_CARE_PROVIDER_SITE_OTHER): Payer: Self-pay

## 2016-06-21 ENCOUNTER — Ambulatory Visit (INDEPENDENT_AMBULATORY_CARE_PROVIDER_SITE_OTHER): Payer: Medicare Other

## 2016-06-21 ENCOUNTER — Ambulatory Visit (INDEPENDENT_AMBULATORY_CARE_PROVIDER_SITE_OTHER): Payer: Self-pay

## 2016-06-21 ENCOUNTER — Ambulatory Visit (INDEPENDENT_AMBULATORY_CARE_PROVIDER_SITE_OTHER): Payer: Medicare Other | Admitting: Orthopaedic Surgery

## 2016-06-21 ENCOUNTER — Encounter (INDEPENDENT_AMBULATORY_CARE_PROVIDER_SITE_OTHER): Payer: Self-pay | Admitting: Orthopaedic Surgery

## 2016-06-21 DIAGNOSIS — M1711 Unilateral primary osteoarthritis, right knee: Secondary | ICD-10-CM | POA: Diagnosis not present

## 2016-06-21 DIAGNOSIS — M1612 Unilateral primary osteoarthritis, left hip: Secondary | ICD-10-CM | POA: Diagnosis not present

## 2016-06-21 DIAGNOSIS — M1712 Unilateral primary osteoarthritis, left knee: Secondary | ICD-10-CM | POA: Diagnosis not present

## 2016-06-21 DIAGNOSIS — M1611 Unilateral primary osteoarthritis, right hip: Secondary | ICD-10-CM

## 2016-06-21 MED ORDER — BUPIVACAINE HCL 0.5 % IJ SOLN
2.0000 mL | INTRAMUSCULAR | Status: AC | PRN
  Administered 2016-06-21: 2 mL via INTRA_ARTICULAR

## 2016-06-21 MED ORDER — METHYLPREDNISOLONE ACETATE 40 MG/ML IJ SUSP
40.0000 mg | INTRAMUSCULAR | Status: AC | PRN
  Administered 2016-06-21: 40 mg via INTRA_ARTICULAR

## 2016-06-21 MED ORDER — MELOXICAM 7.5 MG PO TABS: 7.5000 mg | ORAL_TABLET | Freq: Two times a day (BID) | ORAL | 2 refills | Status: DC | PRN

## 2016-06-21 MED ORDER — LIDOCAINE HCL 1 % IJ SOLN
2.0000 mL | INTRAMUSCULAR | Status: AC | PRN
  Administered 2016-06-21: 2 mL

## 2016-06-21 NOTE — Progress Notes (Signed)
Office Visit Note   Patient: Tabitha Moore           Date of Birth: 1940-06-03           MRN: [id number] Visit Date: 06/21/2016              Requested by: Shon Baton, MD [place],  [id number] PCP: Precious Reel, MD   Assessment & Plan: Visit Diagnoses:  1. Unilateral primary osteoarthritis, right knee   2. Unilateral primary osteoarthritis, left knee   3. Primary osteoarthritis of right hip   4. Primary osteoarthritis of left hip     Plan: Bilateral knee injections performed today. We'll refer to Dr. Ernestina Patches for left hip injection. Modic was prescribed. Follow-up with Me as needed.  Follow-Up Instructions: No Follow-up on file.   Orders:  Orders Placed This Encounter  Procedures  . XR Pelvis 1-2 Views  . XR Knee 1-2 Views Right  . XR Knee 1-2 Views Left   Meds ordered this encounter  Medications  . meloxicam (MOBIC) 7.5 MG tablet    Sig: Take 1 tablet (7.5 mg total) by mouth 2 (two) times daily as needed for pain.    Dispense:  30 tablet    Refill:  2      Procedures: Large Joint Inj Date/Time: 06/21/2016 9:53 AM Performed by: Leandrew Koyanagi Authorized by: Leandrew Koyanagi   Consent Given by:  Patient Timeout: prior to procedure the correct patient, procedure, and site was verified   Indications:  Pain Location:  Knee Site:  R knee Prep: patient was prepped and draped in usual sterile fashion   Needle Size:  22 G Ultrasound Guidance: No   Fluoroscopic Guidance: No   Arthrogram: No   Patient tolerance:  Patient tolerated the procedure well with no immediate complications Large Joint Inj Date/Time: 06/21/2016 9:53 AM Performed by: Leandrew Koyanagi Authorized by: Leandrew Koyanagi   Consent Given by:  Patient Timeout: prior to procedure the correct patient, procedure, and site was verified   Indications:  Pain Location:  Knee Site:  R knee Prep: patient was prepped and draped in usual sterile fashion   Needle Size:  22 G Ultrasound Guidance: No    Fluoroscopic Guidance: No   Arthrogram: No   Medications:  2 mL lidocaine 1 %; 2 mL bupivacaine 0.5 %; 40 mg methylPREDNISolone acetate 40 MG/ML Patient tolerance:  Patient tolerated the procedure well with no immediate complications     Clinical Data: No additional findings.   Subjective: No chief complaint on file.   Comes in today with bilateral hip and knee pain and weakness and achiness. This is been going on for several months. She has tried aspirin with minimal relief. Denies any mechanical symptoms. Patient pain radiating from her hip down into her knees.    Review of Systems  Constitutional: Negative.   HENT: Negative.   Eyes: Negative.   Respiratory: Negative.   Cardiovascular: Negative.   Endocrine: Negative.   Musculoskeletal: Negative.   Neurological: Negative.   Hematological: Negative.   Psychiatric/Behavioral: Negative.   All other systems reviewed and are negative.    Objective: Vital Signs: There were no vitals taken for this visit.  Physical Exam  Constitutional: She is oriented to person, place, and time. She appears well-developed and well-nourished.  HENT:  Head: Normocephalic and atraumatic.  Eyes: EOM are normal.  Neck: Neck supple.  Pulmonary/Chest: Effort normal.  Abdominal: Soft.  Neurological: She is alert and  oriented to person, place, and time.  Skin: Skin is warm. Capillary refill takes less than 2 seconds.  Psychiatric: She has a normal mood and affect. Her behavior is normal. Judgment and thought content normal.  Nursing note and vitals reviewed.   Ortho Exam Exam of bilateral hips shows good range of motion without significant pain.  Negative sciatic tension signs. Exam of bilateral knees show significant patellofemoral crepitus. Closed cruciate's are stable. No effusion. Good range of motion. Specialty Comments:  No specialty comments available.  Imaging: No results found.   PMFS History: Patient Active Problem List     Diagnosis Date Noted  . Internal and external prolapsed hemorrhoids, bleeding. 10/05/2011   Past Medical History:  Diagnosis Date  . Arthritis   . Complication of anesthesia    pt states "hard to wake up"  . Diabetes mellitus   . Hemorrhoids   . Hyperlipidemia   . Hypertension   . Hypothyroidism     Family History  Problem Relation Age of Onset  . Cancer Father     lung  . Cancer Sister     breast    Past Surgical History:  Procedure Laterality Date  . ABDOMINAL HYSTERECTOMY    . BREAST LUMPECTOMY WITH RADIOACTIVE SEED LOCALIZATION Left 08/27/2014   Procedure: BREAST LUMPECTOMY WITH RADIOACTIVE SEED LOCALIZATION;  Surgeon: Autumn Messing III, MD;  Location: [facility];  Service: General;  Laterality: Left;  . CHOLECYSTECTOMY     Social History   Occupational History  . Not on file.   Social History Main Topics  . Smoking status: Never Smoker  . Smokeless tobacco: Never Used  . Alcohol use No  . Drug use: No  . Sexual activity: Not on file

## 2016-06-28 ENCOUNTER — Telehealth (INDEPENDENT_AMBULATORY_CARE_PROVIDER_SITE_OTHER): Payer: Self-pay

## 2016-06-28 NOTE — Telephone Encounter (Signed)
Patient left vm wanting to set up hip injection with Dr. Ernestina Patches that was ordered by Dr Erlinda Hong. No order in chart but it is documented in office note. Can you put in a referral for this please? Thanks! I went ahead and called her back and scheduled for 07/04/16.

## 2016-07-04 ENCOUNTER — Ambulatory Visit (INDEPENDENT_AMBULATORY_CARE_PROVIDER_SITE_OTHER): Payer: Medicare Other | Admitting: Physical Medicine and Rehabilitation

## 2016-07-04 ENCOUNTER — Ambulatory Visit (INDEPENDENT_AMBULATORY_CARE_PROVIDER_SITE_OTHER): Payer: Medicare Other

## 2016-07-04 VITALS — BP 145/80 | HR 98

## 2016-07-04 DIAGNOSIS — M25552 Pain in left hip: Secondary | ICD-10-CM

## 2016-07-04 NOTE — Progress Notes (Signed)
Tabitha Moore - 76 y.o. female MRN [id number]  Date of birth: 10-30-1940  Office Visit Note: Visit Date: 07/04/2016 PCP: Precious Reel, MD Referred by: Shon Baton, MD  Subjective: Chief Complaint  Patient presents with  . Left Hip - Pain   HPI: Tabitha Moore is a pleasant 76 year old female who comes in today with pain in left hip that radiates into buttock area down back of leg on the left  Worse when weightbearing. She states she has some burning around the ankle. Cannot lay on left side without having pain. Hurts when sitting for too long and having to get up is a struggle.  Pain level this morning is (8/10). Pain comes and goes. Takes Aspirin PRN.    ROS Otherwise per HPI.  Assessment & Plan: Visit Diagnoses:  1. Pain in left hip     Plan: Findings:  Left hip anesthetic arthrogram. Patient had some relief during the anesthetic phase but was still having trouble with weightbearing and walking.    Meds & Orders: No orders of the defined types were placed in this encounter.   Orders Placed This Encounter  Procedures  . Large Joint Injection/Arthrocentesis  . XR C-ARM NO REPORT    Follow-up: Return if symptoms worsen or fail to improve, for Dr. Erlinda Hong.   Procedures: Hip anesthetic arthrogram Date/Time: 07/04/2016 9:50 AM Performed by: Magnus Sinning Authorized by: Magnus Sinning   Consent Given by:  Patient Site marked: the procedure site was marked   Timeout: prior to procedure the correct patient, procedure, and site was verified   Indications:  Pain and diagnostic evaluation Location:  Hip Site:  L hip joint Prep: patient was prepped and draped in usual sterile fashion   Needle Size:  22 G Approach:  Anterior Ultrasound Guidance: No   Fluoroscopic Guidance: No   Arthrogram: Yes   Medications:  3 mL bupivacaine 0.5 %; 80 mg triamcinolone acetonide 40 MG/ML Aspiration Attempted: Yes   Patient tolerance:  Patient tolerated the procedure well with no immediate  complications  Arthrogram demonstrated excellent flow of contrast throughout the joint surface without extravasation or obvious defect.  The patient had some relief of symptoms during the anesthetic phase of the injection.      No notes on file   Clinical History: No specialty comments available.  She reports that she has never smoked. She has never used smokeless tobacco. No results for input(s): HGBA1C, LABURIC in the last 8760 hours.  Objective:  VS:  HT:    WT:   BMI:     BP:(!) 145/80  HR:98bpm  TEMP: ( )  RESP:  Physical Exam  Musculoskeletal:  Patient ambulates with an antalgic gait with good distal strength. Hip range of motion is somewhat limited she does have pain at end ranges.    Ortho Exam Imaging: No results found.  Past Medical/Family/Surgical/Social History: Medications & Allergies reviewed per EMR Patient Active Problem List   Diagnosis Date Noted  . Internal and external prolapsed hemorrhoids, bleeding. 10/05/2011   Past Medical History:  Diagnosis Date  . Arthritis   . Complication of anesthesia    pt states "hard to wake up"  . Diabetes mellitus   . Hemorrhoids   . Hyperlipidemia   . Hypertension   . Hypothyroidism    Family History  Problem Relation Age of Onset  . Cancer Father     lung  . Cancer Sister     breast   Past Surgical History:  Procedure Laterality  Date  . ABDOMINAL HYSTERECTOMY    . BREAST LUMPECTOMY WITH RADIOACTIVE SEED LOCALIZATION Left 08/27/2014   Procedure: BREAST LUMPECTOMY WITH RADIOACTIVE SEED LOCALIZATION;  Surgeon: Autumn Messing III, MD;  Location: [facility];  Service: General;  Laterality: Left;  . CHOLECYSTECTOMY     Social History   Occupational History  . Not on file.   Social History Main Topics  . Smoking status: Never Smoker  . Smokeless tobacco: Never Used  . Alcohol use No  . Drug use: No  . Sexual activity: Not on file

## 2016-07-04 NOTE — Patient Instructions (Signed)

## 2016-07-06 MED ORDER — TRIAMCINOLONE ACETONIDE 40 MG/ML IJ SUSP
80.0000 mg | INTRAMUSCULAR | Status: AC | PRN
  Administered 2016-07-04: 80 mg via INTRA_ARTICULAR

## 2016-07-06 MED ORDER — BUPIVACAINE HCL 0.5 % IJ SOLN
3.0000 mL | INTRAMUSCULAR | Status: AC | PRN
  Administered 2016-07-04: 3 mL via INTRA_ARTICULAR

## 2016-07-10 DIAGNOSIS — H30033 Focal chorioretinal inflammation, peripheral, bilateral: Secondary | ICD-10-CM | POA: Diagnosis not present

## 2016-07-10 DIAGNOSIS — E113313 Type 2 diabetes mellitus with moderate nonproliferative diabetic retinopathy with macular edema, bilateral: Secondary | ICD-10-CM | POA: Diagnosis not present

## 2016-07-10 DIAGNOSIS — Z794 Long term (current) use of insulin: Secondary | ICD-10-CM | POA: Diagnosis not present

## 2016-07-10 DIAGNOSIS — H26492 Other secondary cataract, left eye: Secondary | ICD-10-CM | POA: Diagnosis not present

## 2016-07-10 DIAGNOSIS — H3581 Retinal edema: Secondary | ICD-10-CM | POA: Diagnosis not present

## 2016-07-10 DIAGNOSIS — Z961 Presence of intraocular lens: Secondary | ICD-10-CM | POA: Diagnosis not present

## 2016-07-12 DIAGNOSIS — H30033 Focal chorioretinal inflammation, peripheral, bilateral: Secondary | ICD-10-CM | POA: Diagnosis not present

## 2016-07-12 DIAGNOSIS — H3581 Retinal edema: Secondary | ICD-10-CM | POA: Diagnosis not present

## 2016-08-03 ENCOUNTER — Encounter (INDEPENDENT_AMBULATORY_CARE_PROVIDER_SITE_OTHER): Payer: Self-pay

## 2016-08-03 ENCOUNTER — Telehealth (INDEPENDENT_AMBULATORY_CARE_PROVIDER_SITE_OTHER): Payer: Self-pay

## 2016-08-03 ENCOUNTER — Ambulatory Visit (INDEPENDENT_AMBULATORY_CARE_PROVIDER_SITE_OTHER): Payer: Medicare Other | Admitting: Orthopaedic Surgery

## 2016-08-03 ENCOUNTER — Encounter (INDEPENDENT_AMBULATORY_CARE_PROVIDER_SITE_OTHER): Payer: Self-pay | Admitting: Orthopaedic Surgery

## 2016-08-03 DIAGNOSIS — M5416 Radiculopathy, lumbar region: Secondary | ICD-10-CM | POA: Diagnosis not present

## 2016-08-03 NOTE — Addendum Note (Signed)
Addended by: Precious Bard on: 08/03/2016 10:26 AM   Modules accepted: Orders

## 2016-08-03 NOTE — Progress Notes (Signed)
   Office Visit Note   Patient: Tabitha Moore           Date of Birth: 1940/09/26           MRN: [id number] Visit Date: 08/03/2016              Requested by: Shon Baton, MD [place], [place] [address] PCP: Precious Reel, MD   Assessment & Plan: Visit Diagnoses:  1. Lumbar radiculopathy     Plan: MRI lumbar spine evaluate for structural abnormality. Follow-up afterwards.  Follow-Up Instructions: Return in about 2 weeks (around 08/17/2016).   Orders:  No orders of the defined types were placed in this encounter.  No orders of the defined types were placed in this encounter.     Procedures: No procedures performed   Clinical Data: No additional findings.   Subjective: Chief Complaint  Patient presents with  . Left Hip - Pain    Patient follows up today for continued left lower extremity pain and hip pain. She had minimal relief from the hip injection with Dr. Ernestina Patches.    Review of Systems   Objective: Vital Signs: There were no vitals taken for this visit.  Physical Exam  Ortho Exam Left hip exam shows no pain with rotation. Specialty Comments:  No specialty comments available.  Imaging: No results found.   PMFS History: Patient Active Problem List   Diagnosis Date Noted  . Internal and external prolapsed hemorrhoids, bleeding. 10/05/2011   Past Medical History:  Diagnosis Date  . Arthritis   . Complication of anesthesia    pt states "hard to wake up"  . Diabetes mellitus   . Hemorrhoids   . Hyperlipidemia   . Hypertension   . Hypothyroidism     Family History  Problem Relation Age of Onset  . Cancer Father     lung  . Cancer Sister     breast    Past Surgical History:  Procedure Laterality Date  . ABDOMINAL HYSTERECTOMY    . BREAST LUMPECTOMY WITH RADIOACTIVE SEED LOCALIZATION Left 08/27/2014   Procedure: BREAST LUMPECTOMY WITH RADIOACTIVE SEED LOCALIZATION;  Surgeon: Autumn Messing III, MD;  Location: [facility];  Service: General;  Laterality: Left;  . CHOLECYSTECTOMY     Social History   Occupational History  . Not on file.   Social History Main Topics  . Smoking status: Never Smoker  . Smokeless tobacco: Never Used  . Alcohol use No  . Drug use: No  . Sexual activity: Not on file

## 2016-08-03 NOTE — Telephone Encounter (Signed)
Patient needs Rx for Meloxicam sent to her pharmacy.  Stated that she went to the pharmacy and was told there was no Rx on file for Meloxicam.  CB # is [phone] or [phone].

## 2016-08-06 MED ORDER — MELOXICAM 7.5 MG PO TABS: 7.5000 mg | ORAL_TABLET | Freq: Two times a day (BID) | ORAL | 2 refills | Status: DC | PRN

## 2016-08-06 NOTE — Telephone Encounter (Signed)
I sent it in 

## 2016-08-06 NOTE — Telephone Encounter (Signed)
Okay to Rf Rx for Meloxicam?

## 2016-08-06 NOTE — Addendum Note (Signed)
Addended by: Azucena Cecil on: 08/06/2016 10:10 AM   Modules accepted: Orders

## 2016-08-20 ENCOUNTER — Ambulatory Visit (INDEPENDENT_AMBULATORY_CARE_PROVIDER_SITE_OTHER): Payer: Medicare Other | Admitting: Orthopaedic Surgery

## 2016-08-20 ENCOUNTER — Telehealth (INDEPENDENT_AMBULATORY_CARE_PROVIDER_SITE_OTHER): Payer: Self-pay | Admitting: Orthopaedic Surgery

## 2016-08-20 NOTE — Telephone Encounter (Signed)
Patient came in today for a MRI review and hasn't been scheduled for an MRI yet. If you could give her a call and set one up. Thank you. CB # [phone]

## 2016-08-20 NOTE — Telephone Encounter (Signed)
Lis and I both just discussed this, Looks like [facility] has been trying to contact pt but her vm was full so could not leave message, Lynann Bologna is going to let pt know and give her number to contact [facility] to schedule appt

## 2016-08-24 ENCOUNTER — Ambulatory Visit
Admission: RE | Admit: 2016-08-24 | Discharge: 2016-08-24 | Disposition: A | Discharge status: Home / Self Care | Payer: Medicare Other | Source: Ambulatory Visit | Attending: Orthopaedic Surgery | Admitting: Orthopaedic Surgery

## 2016-08-24 DIAGNOSIS — M5416 Radiculopathy, lumbar region: Secondary | ICD-10-CM

## 2016-08-24 DIAGNOSIS — M5136 Other intervertebral disc degeneration, lumbar region: Secondary | ICD-10-CM | POA: Diagnosis not present

## 2016-08-28 ENCOUNTER — Ambulatory Visit (INDEPENDENT_AMBULATORY_CARE_PROVIDER_SITE_OTHER): Payer: Medicare Other | Admitting: Orthopaedic Surgery

## 2016-08-31 ENCOUNTER — Ambulatory Visit (INDEPENDENT_AMBULATORY_CARE_PROVIDER_SITE_OTHER): Payer: Medicare Other | Admitting: Orthopaedic Surgery

## 2016-08-31 ENCOUNTER — Encounter (INDEPENDENT_AMBULATORY_CARE_PROVIDER_SITE_OTHER): Payer: Self-pay | Admitting: Orthopaedic Surgery

## 2016-08-31 DIAGNOSIS — M5416 Radiculopathy, lumbar region: Secondary | ICD-10-CM | POA: Diagnosis not present

## 2016-08-31 NOTE — Addendum Note (Signed)
Addended by: Precious Bard on: 08/31/2016 10:42 AM   Modules accepted: Orders

## 2016-08-31 NOTE — Progress Notes (Signed)
   Office Visit Note   Patient: Tabitha Moore           Date of Birth: 07-29-1940           MRN: [id number] Visit Date: 08/31/2016              Requested by: Shon Baton, [address] [place], [place] [address] PCP: Shon Baton, MD   Assessment & Plan: Visit Diagnoses:  1. Lumbar radiculopathy     Plan: MRI of the lumbar spine shows multilevel degenerative disease and facet disease. She has mild foraminal stenosis greater on the left at L3-L4. She also has degenerative changes at L4-L5 and L5-S1. Referral to Dr. Ernestina Patches for epidural steroid injection was made. Follow-up with me as needed.  Follow-Up Instructions: Return if symptoms worsen or fail to improve.   Orders:  No orders of the defined types were placed in this encounter.  No orders of the defined types were placed in this encounter.     Procedures: No procedures performed   Clinical Data: No additional findings.   Subjective: Chief Complaint  Patient presents with  . Lower Back - Pain, Follow-up    Patient comes back today to review her MRI. She continues to have left-sided lumbar radiculopathy. She denies any new symptoms.    Review of Systems   Objective: Vital Signs: There were no vitals taken for this visit.  Physical Exam  Ortho Exam Exam is stable and unchanged. Specialty Comments:  No specialty comments available.  Imaging: No results found.   PMFS History: Patient Active Problem List   Diagnosis Date Noted  . Internal and external prolapsed hemorrhoids, bleeding. 10/05/2011   Past Medical History:  Diagnosis Date  . Arthritis   . Complication of anesthesia    pt states "hard to wake up"  . Diabetes mellitus   . Hemorrhoids   . Hyperlipidemia   . Hypertension   . Hypothyroidism     Family History  Problem Relation Age of Onset  . Cancer Father        lung  . Cancer Sister        breast    Past Surgical History:  Procedure Laterality Date  . ABDOMINAL  HYSTERECTOMY    . BREAST LUMPECTOMY WITH RADIOACTIVE SEED LOCALIZATION Left 08/27/2014   Procedure: BREAST LUMPECTOMY WITH RADIOACTIVE SEED LOCALIZATION;  Surgeon: Autumn Messing III, MD;  Location: [facility];  Service: General;  Laterality: Left;  . CHOLECYSTECTOMY     Social History   Occupational History  . Not on file.   Social History Main Topics  . Smoking status: Never Smoker  . Smokeless tobacco: Never Used  . Alcohol use No  . Drug use: No  . Sexual activity: Not on file

## 2016-09-11 ENCOUNTER — Encounter (INDEPENDENT_AMBULATORY_CARE_PROVIDER_SITE_OTHER): Payer: Self-pay | Admitting: Physical Medicine and Rehabilitation

## 2016-09-11 ENCOUNTER — Ambulatory Visit (INDEPENDENT_AMBULATORY_CARE_PROVIDER_SITE_OTHER): Payer: Medicare Other | Admitting: Physical Medicine and Rehabilitation

## 2016-09-11 ENCOUNTER — Ambulatory Visit (INDEPENDENT_AMBULATORY_CARE_PROVIDER_SITE_OTHER): Payer: Self-pay

## 2016-09-11 VITALS — BP 142/79 | HR 92 | Temp 98.0°F

## 2016-09-11 DIAGNOSIS — M5416 Radiculopathy, lumbar region: Secondary | ICD-10-CM

## 2016-09-11 MED ORDER — METHYLPREDNISOLONE ACETATE 80 MG/ML IJ SUSP
80.0000 mg | Freq: Once | INTRAMUSCULAR | Status: AC
  Administered 2016-09-11: 80 mg

## 2016-09-11 MED ORDER — LIDOCAINE HCL (PF) 1 % IJ SOLN
2.0000 mL | Freq: Once | INTRAMUSCULAR | Status: AC
  Administered 2016-09-11: 2 mL

## 2016-09-11 NOTE — Patient Instructions (Signed)

## 2016-09-11 NOTE — Progress Notes (Deleted)
Left side low back pain into hip. States she has discomfort in the groin area. Not really pain but very uncomfortable. Radiates down left leg to foot. Denies numbness and tingling.

## 2016-09-13 NOTE — Procedures (Signed)
Lumbar Epidural Steroid Injection - Interlaminar Approach with Fluoroscopic Guidance  Patient: Tabitha Moore      Date of Birth: 07/19/1940 MRN: [id number] PCP: Shon Baton, MD      Visit Date: 09/11/2016   [name] is a very pleasant 76 year old female that I seen in the past for left intra-articular diagnostic anesthetic hip arthrogram. The patient had some relief at that time with the anesthetic phase but had no relief when she was up walking and putting pressure on her left hip. She does report groin pain but also pain radicular leg down to the calf and foot. MRI of the lumbar spine has been performed which shows significant facet arthropathy both at L4-5 and L5-S1 but with moderate to moderate severe central canal lateral recess stenosis at L4-5. We are in a complete a left L4-5 intralaminar injection diagnostically and therapeutically. Just as noted I have seen facet joints at L4-5 causing a referral pattern into the groin area. She does not have any painful range of motion with hip rotation.  Universal Protocol:    Date/Time: 09/14/1810:31 PM  Consent Given By: the patient  Position: PRONE  Additional Comments: Vital signs were monitored before and after the procedure. Patient was prepped and draped in the usual sterile fashion. The correct patient, procedure, and site was verified.   Injection Procedure Details:  Procedure Site One Meds Administered:  Meds ordered this encounter  Medications  . lidocaine (PF) (XYLOCAINE) 1 % injection 2 mL  . methylPREDNISolone acetate (DEPO-MEDROL) injection 80 mg     Laterality: Left  Location/Site:  L4-L5  Needle size: 20 G  Needle type: Tuohy  Needle Placement: Paramedian epidural  Findings:  -Contrast Used: 1 mL iohexol 180 mg iodine/mL   -Comments: Excellent flow of contrast into the epidural space.  Procedure Details: Using a paramedian approach from the side mentioned above, the region overlying the inferior lamina  was localized under fluoroscopic visualization and the soft tissues overlying this structure were infiltrated with 4 ml. of 1% Lidocaine without Epinephrine. The Tuohy needle was inserted into the epidural space using a paramedian approach.   The epidural space was localized using loss of resistance along with lateral and bi-planar fluoroscopic views.  After negative aspirate for air, blood, and CSF, a 2 ml. volume of Isovue-250 was injected into the epidural space and the flow of contrast was observed. Radiographs were obtained for documentation purposes.    The injectate was administered into the level noted above.   Additional Comments:  No complications occurred Dressing: Band-Aid    Post-procedure details: Patient was observed during the procedure. Post-procedure instructions were reviewed.  Patient left the clinic in stable condition.

## 2016-10-08 DIAGNOSIS — J209 Acute bronchitis, unspecified: Secondary | ICD-10-CM | POA: Diagnosis not present

## 2016-10-08 DIAGNOSIS — Z6832 Body mass index (BMI) 32.0-32.9, adult: Secondary | ICD-10-CM | POA: Diagnosis not present

## 2016-10-08 DIAGNOSIS — R05 Cough: Secondary | ICD-10-CM | POA: Diagnosis not present

## 2016-10-08 DIAGNOSIS — J029 Acute pharyngitis, unspecified: Secondary | ICD-10-CM | POA: Diagnosis not present

## 2016-10-18 DIAGNOSIS — I493 Ventricular premature depolarization: Secondary | ICD-10-CM | POA: Insufficient documentation

## 2016-10-18 DIAGNOSIS — H209 Unspecified iridocyclitis: Secondary | ICD-10-CM | POA: Insufficient documentation

## 2016-10-18 DIAGNOSIS — E785 Hyperlipidemia, unspecified: Secondary | ICD-10-CM | POA: Insufficient documentation

## 2016-10-18 DIAGNOSIS — I517 Cardiomegaly: Secondary | ICD-10-CM | POA: Insufficient documentation

## 2016-10-18 DIAGNOSIS — Z8719 Personal history of other diseases of the digestive system: Secondary | ICD-10-CM | POA: Insufficient documentation

## 2016-10-18 DIAGNOSIS — I10 Essential (primary) hypertension: Secondary | ICD-10-CM | POA: Insufficient documentation

## 2016-10-18 DIAGNOSIS — E669 Obesity, unspecified: Secondary | ICD-10-CM | POA: Insufficient documentation

## 2016-10-18 DIAGNOSIS — Z8639 Personal history of other endocrine, nutritional and metabolic disease: Secondary | ICD-10-CM | POA: Insufficient documentation

## 2016-10-18 DIAGNOSIS — H3093 Unspecified chorioretinal inflammation, bilateral: Secondary | ICD-10-CM | POA: Insufficient documentation

## 2016-10-18 DIAGNOSIS — N184 Chronic kidney disease, stage 4 (severe): Secondary | ICD-10-CM | POA: Insufficient documentation

## 2016-10-18 NOTE — Progress Notes (Signed)
Office Visit Note  Patient: Tabitha Moore             Date of Birth: 02-25-1941           MRN: [id number]             PCP: Shon Baton, MD Referring: Shon Baton, MD Visit Date: 10/19/2016 Occupation: _0 @    Subjective:  Pain in multiple joints.   History of Present Illness: Tabitha Moore is a 76 y.o. female seen in consultation per request of her PCP. According to patient she has had recurrent eye inflammation for multiple years. She had cataract surgery by Dr. Herbert Deaner. She later was seen by Dr. Silvestre Gunner at [facility] for retinal detachment. She had surgery for that. She has had frequent prednisone eyedrops for recurrent eye inflammation. She started seeing Dr. Manuella Ghazi now. Who felt that her eye inflammation is due to an underlying autoimmune process. He started on methotrexate in April 2018 and tapered her prednisone drops. She's been taking methotrexate 6 tablets per week along with folic acid and is tolerating it well. Her eye symptoms have improved on methotrexate. She also gets long-standing history of discomfort in her knee joints. She states she was recently evaluated by Dr. Erlinda Hong  and had cortisone injections to bilateral knee joints. She continued to have discomfort in her left hip joint for which she had left hip joint injection by Dr. Ernestina Patches but did not get much relief. She had x-rays of her lumbar spine which were consistent with this disease of lumbar spine. She was referred to [facility] imaging for cortisone injection and that relieved her back pain and also hip pain. She came today for evaluation of multiple arthralgias. Patient reports history of left ankle swelling for the last few months.  Activities of Daily Living:  Patient reports morning stiffness for 5 minutes.   Patient Denies nocturnal pain.  Difficulty dressing/grooming: Denies Difficulty climbing stairs: Reports Difficulty getting out of chair: Denies Difficulty using hands for taps, buttons, cutlery,  and/or writing: Denies   Review of Systems  Constitutional: Positive for fatigue. Negative for night sweats, weight gain, weight loss and weakness.  HENT: Negative for mouth sores, trouble swallowing, trouble swallowing, mouth dryness and nose dryness.   Eyes: Positive for dryness. Negative for pain, redness and visual disturbance.  Respiratory: Negative for cough, shortness of breath and difficulty breathing.   Cardiovascular: Negative for chest pain, palpitations, hypertension, irregular heartbeat and swelling in legs/feet.  Gastrointestinal: Negative for blood in stool, constipation and diarrhea.  Endocrine: Negative for increased urination.  Genitourinary: Negative for vaginal dryness.  Musculoskeletal: Positive for arthralgias, joint pain and morning stiffness. Negative for joint swelling, myalgias, muscle weakness, muscle tenderness and myalgias.  Skin: Negative for color change, rash, hair loss, skin tightness, ulcers and sensitivity to sunlight.  Allergic/Immunologic: Negative for susceptible to infections.  Neurological: Negative for dizziness, memory loss and night sweats.  Hematological: Negative for swollen glands.  Psychiatric/Behavioral: Negative for depressed mood and sleep disturbance. The patient is not nervous/anxious.     PMFS History:  Patient Active Problem List   Diagnosis Date Noted  . Essential hypertension 10/18/2016  . Left ventricular hypertrophy 10/18/2016  . PVC's (premature ventricular contractions) 10/18/2016  . History of diabetes mellitus 10/18/2016  . History of hypothyroidism 10/18/2016  . Dyslipidemia 10/18/2016  . Obesity (BMI 35.0-39.9 without comorbidity) 10/18/2016  . History of GI diverticular bleed 10/18/2016  . Stage 4 chronic kidney disease ([place]) 10/18/2016  . Chorioretinitis  of both eyes 10/18/2016  . Internal and external prolapsed hemorrhoids, bleeding. 10/05/2011    Past Medical History:  Diagnosis Date  . Arthritis   .  Complication of anesthesia    pt states "hard to wake up"  . Diabetes mellitus   . Hemorrhoids   . Hyperlipidemia   . Hypertension   . Hypothyroidism     Family History  Problem Relation Age of Onset  . Cancer Father        lung  . Cancer Sister        breast   Past Surgical History:  Procedure Laterality Date  . ABDOMINAL HYSTERECTOMY    . BREAST LUMPECTOMY WITH RADIOACTIVE SEED LOCALIZATION Left 08/27/2014   Procedure: BREAST LUMPECTOMY WITH RADIOACTIVE SEED LOCALIZATION;  Surgeon: Autumn Messing III, MD;  Location: [facility];  Service: General;  Laterality: Left;  . CHOLECYSTECTOMY     Social History   Social History Narrative  . No narrative on file     Objective: Vital Signs: BP 139/69 (BP Location: Left Arm, Patient Position: Sitting, Cuff Size: Normal)   Pulse 94   Resp 16   Ht _0  (1.575 m)   Wt 195 lb (88.5 kg)   BMI 35.67 kg/m    Physical Exam  Constitutional: She is oriented to person, place, and time. She appears well-developed and well-nourished.  HENT:  Head: Normocephalic and atraumatic.  Eyes: Conjunctivae and EOM are normal.  Neck: Normal range of motion.  Cardiovascular: Normal rate, regular rhythm, normal heart sounds and intact distal pulses.   Pulmonary/Chest: Effort normal and breath sounds normal.  Abdominal: Soft. Bowel sounds are normal.  Lymphadenopathy:    She has no cervical adenopathy.  Neurological: She is alert and oriented to person, place, and time.  Skin: Skin is warm and dry. Capillary refill takes less than 2 seconds.  Psychiatric: She has a normal mood and affect. Her behavior is normal.  Nursing note and vitals reviewed.    Musculoskeletal Exam: C-spine and thoracic lumbar spine good range of motion. Shoulder joints elbow joints wrist joints are good range of motion. MCPs PIPs DIPs with good range of motion. With no synovitis. She had discomfort with range of motion of her lumbar spine. Her hip joints are good  range of motion with no discomfort. Knee joints are good with range of motion with some crepitus without any warmth swelling or effusion. She had some fullness in her left ankle joint. With mild tenderness. She has some MTP changes consistent with osteoarthritis no synovitis was noted.   CDAI Exam: No CDAI exam completed.    Investigation: Findings:  DEXA 05/30/2011 normal, pneumococcal vaccine February 2014, Zostavax 06/03/2013 06/07/2016 CMP normal GFR 48, CBC normal, lipid panel normal, TSH low at 0.24  10/10/2015 Lyme titer negative, RF negative ANA negative, C3-C4 normal, hepatitis panel negative, RPR negative, immunoglobulins normal, SPEP negative    Imaging: Xr Ankle 2 Views Left  Result Date: 10/19/2016 No joint space narrowing with spurring was noted. No erosions were noted. Impression normal exam of the ankle joint   Speciality Comments: No specialty comments available.    Procedures:  No procedures performed Allergies: Shellfish allergy   Assessment / Plan:     Visit Diagnoses: Chorioretinitis of both eyes - Recurrent. Her symptoms have improved since she's been on methotrexate per Dr. Manuella Ghazi. She has had some labs by Dr. Manuella Ghazi in the past which were all within normal limits. I will obtain some additional labs today. -  Plan: Angiotensin converting enzyme, Pan-ANCA, HLA-B27 antigen  High risk medication use - On methotrexate 6 tablets by mouth every week, folic acid 1 mg by mouth daily, started by Dr. Manuella Ghazi April 2018 (treated by Dr. Charlestine Night in the past)  Essential hypertension: Her blood pressure is well controlled.   Pain of left hip joint: Her hip joint pain has resolved after the cortisone injection.   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 Knee joints muscle strengthening exercises were given. A list of natural aunt inflammatory's was given. I also discussed possible  viscose supplement injections in future if she has ongoing knee pain.   Pain in left ankle and joints of left foot. She has mild fullness in her left ankle joint and mild tenderness but the x-ray was unremarkable today. - Plan: XR Ankle 2 Views Left, Cyclic citrul peptide antibody, IgG, 14-3-3 eta Protein, Sedimentation rate, Urinalysis, Routine w reflex microscopic  DDD lumbar spine: She did very well after the cortisone injection to her lumbar spine. Lumbar spine exercises handout was given.   Left ventricular hypertrophy  PVC's (premature ventricular contractions)  History of diabetes mellitus  Stage 3 chronic kidney disease - GFR  40s to 50s  History of hypothyroidism  Dyslipidemia  Obesity (BMI 35.0-39.9 without comorbidity)  History of GI diverticular bleed  Other hemorrhoids    Orders: Orders Placed This Encounter  Procedures  . XR Ankle 2 Views Left  . Cyclic citrul peptide antibody, IgG  . 14-3-3 eta Protein  . Sedimentation rate  . Urinalysis, Routine w reflex microscopic  . Angiotensin converting enzyme  . Pan-ANCA  . HLA-B27 antigen   No orders of the defined types were placed in this encounter.   Face-to-face time spent with patient was 50 minutes. 50% of time was spent in counseling and coordination of care.  Follow-Up Instructions: Return for Osteoarthritis chorioretinitis.   Bo Merino, MD  Note - This record has been created using Editor, commissioning.  Chart creation errors have been sought, but may not always  have been located. Such creation errors do not reflect on  the standard of medical care.

## 2016-10-19 ENCOUNTER — Ambulatory Visit (INDEPENDENT_AMBULATORY_CARE_PROVIDER_SITE_OTHER): Payer: Medicare Other

## 2016-10-19 ENCOUNTER — Encounter: Payer: Self-pay | Admitting: Rheumatology

## 2016-10-19 ENCOUNTER — Ambulatory Visit (INDEPENDENT_AMBULATORY_CARE_PROVIDER_SITE_OTHER): Payer: Medicare Other | Admitting: Rheumatology

## 2016-10-19 VITALS — BP 139/69 | HR 94 | Resp 16 | Ht 62.0 in | Wt 195.0 lb

## 2016-10-19 DIAGNOSIS — N183 Chronic kidney disease, stage 3 unspecified: Secondary | ICD-10-CM

## 2016-10-19 DIAGNOSIS — I493 Ventricular premature depolarization: Secondary | ICD-10-CM

## 2016-10-19 DIAGNOSIS — I517 Cardiomegaly: Secondary | ICD-10-CM | POA: Diagnosis not present

## 2016-10-19 DIAGNOSIS — M25572 Pain in left ankle and joints of left foot: Secondary | ICD-10-CM | POA: Diagnosis not present

## 2016-10-19 DIAGNOSIS — E785 Hyperlipidemia, unspecified: Secondary | ICD-10-CM

## 2016-10-19 DIAGNOSIS — Z79899 Other long term (current) drug therapy: Secondary | ICD-10-CM | POA: Diagnosis not present

## 2016-10-19 DIAGNOSIS — Z8639 Personal history of other endocrine, nutritional and metabolic disease: Secondary | ICD-10-CM | POA: Diagnosis not present

## 2016-10-19 DIAGNOSIS — I10 Essential (primary) hypertension: Secondary | ICD-10-CM | POA: Diagnosis not present

## 2016-10-19 DIAGNOSIS — H3093 Unspecified chorioretinal inflammation, bilateral: Secondary | ICD-10-CM | POA: Diagnosis not present

## 2016-10-19 DIAGNOSIS — M25552 Pain in left hip: Secondary | ICD-10-CM | POA: Diagnosis not present

## 2016-10-19 DIAGNOSIS — M170 Bilateral primary osteoarthritis of knee: Secondary | ICD-10-CM | POA: Diagnosis not present

## 2016-10-19 DIAGNOSIS — E669 Obesity, unspecified: Secondary | ICD-10-CM

## 2016-10-19 DIAGNOSIS — M47816 Spondylosis without myelopathy or radiculopathy, lumbar region: Secondary | ICD-10-CM

## 2016-10-19 DIAGNOSIS — Z8719 Personal history of other diseases of the digestive system: Secondary | ICD-10-CM

## 2016-10-19 DIAGNOSIS — K648 Other hemorrhoids: Secondary | ICD-10-CM

## 2016-10-19 LAB — URINALYSIS, ROUTINE W REFLEX MICROSCOPIC
Bilirubin Urine: NEGATIVE
Glucose, UA: NEGATIVE
Hgb urine dipstick: NEGATIVE
Ketones, ur: NEGATIVE
Nitrite: NEGATIVE
Protein, ur: NEGATIVE
Specific Gravity, Urine: 1.009 (ref 1.001–1.035)
pH: 6 (ref 5.0–8.0)

## 2016-10-19 LAB — URINALYSIS, MICROSCOPIC ONLY
Casts: NONE SEEN [LPF]
Crystals: NONE SEEN [HPF]
Yeast: NONE SEEN [HPF]

## 2016-10-19 NOTE — Patient Instructions (Signed)
Natural anti-inflammatories  You can purchase these at [employer], [employer] or online.  . Turmeric (capsules)  . Ginger (ginger root or capsules)  . Omega 3 (Fish, flax seeds, chia seeds, walnuts, almonds)  . Tart cherry (dried or extract)   Patient should be under the care of a physician while taking these supplements. This may not be reproduced without the permission of Dr. Emmamarie Kluender.  Knee Exercises Ask your health care provider which exercises are safe for you. Do exercises exactly as told by your health care provider and adjust them as directed. It is normal to feel mild stretching, pulling, tightness, or discomfort as you do these exercises, but you should stop right away if you feel sudden pain or your pain gets worse.Do not begin these exercises until told by your health care provider. STRETCHING AND RANGE OF MOTION EXERCISES These exercises warm up your muscles and joints and improve the movement and flexibility of your knee. These exercises also help to relieve pain, numbness, and tingling. Exercise A: Knee Extension, Prone 1. Lie on your abdomen on a bed. 2. Place your left / right knee just beyond the edge of the surface so your knee is not on the bed. You can put a towel under your left / right thigh just above your knee for comfort. 3. Relax your leg muscles and allow gravity to straighten your knee. You should feel a stretch behind your left / right knee. 4. Hold this position for __________ seconds. 5. Scoot up so your knee is supported between repetitions. Repeat __________ times. Complete this stretch __________ times a day. Exercise B: Knee Flexion, Active  1. Lie on your back with both knees straight. If this causes back discomfort, bend your left / right knee so your foot is flat on the floor. 2. Slowly slide your left / right heel back toward your buttocks until you feel a gentle stretch in the front of your knee or thigh. 3. Hold this position for  __________ seconds. 4. Slowly slide your left / right heel back to the starting position. Repeat __________ times. Complete this exercise __________ times a day. Exercise C: Quadriceps, Prone  1. Lie on your abdomen on a firm surface, such as a bed or padded floor. 2. Bend your left / right knee and hold your ankle. If you cannot reach your ankle or pant leg, loop a belt around your foot and grab the belt instead. 3. Gently pull your heel toward your buttocks. Your knee should not slide out to the side. You should feel a stretch in the front of your thigh and knee. 4. Hold this position for __________ seconds. Repeat __________ times. Complete this stretch __________ times a day. Exercise D: Hamstring, Supine 1. Lie on your back. 2. Loop a belt or towel over the ball of your left / right foot. The ball of your foot is on the walking surface, right under your toes. 3. Straighten your left / right knee and slowly pull on the belt to raise your leg until you feel a gentle stretch behind your knee. ? Do not let your left / right knee bend while you do this. ? Keep your other leg flat on the floor. 4. Hold this position for __________ seconds. Repeat __________ times. Complete this stretch __________ times a day. STRENGTHENING EXERCISES These exercises build strength and endurance in your knee. Endurance is the ability to use your muscles for a long time, even after they get tired. Exercise E:   Quadriceps, Isometric  1. Lie on your back with your left / right leg extended and your other knee bent. Put a rolled towel or small pillow under your knee if told by your health care provider. 2. Slowly tense the muscles in the front of your left / right thigh. You should see your kneecap slide up toward your hip or see increased dimpling just above the knee. This motion will push the back of the knee toward the floor. 3. For __________ seconds, keep the muscle as tight as you can without increasing your  pain. 4. Relax the muscles slowly and completely. Repeat __________ times. Complete this exercise __________ times a day. Exercise F: Straight Leg Raises - Quadriceps 1. Lie on your back with your left / right leg extended and your other knee bent. 2. Tense the muscles in the front of your left / right thigh. You should see your kneecap slide up or see increased dimpling just above the knee. Your thigh may even shake a bit. 3. Keep these muscles tight as you raise your leg 4-6 inches (10-15 cm) off the floor. Do not let your knee bend. 4. Hold this position for __________ seconds. 5. Keep these muscles tense as you lower your leg. 6. Relax your muscles slowly and completely after each repetition. Repeat __________ times. Complete this exercise __________ times a day. Exercise G: Hamstring, Isometric 1. Lie on your back on a firm surface. 2. Bend your left / right knee approximately __________ degrees. 3. Dig your left / right heel into the surface as if you are trying to pull it toward your buttocks. Tighten the muscles in the back of your thighs to dig as hard as you can without increasing any pain. 4. Hold this position for __________ seconds. 5. Release the tension gradually and allow your muscles to relax completely for __________ seconds after each repetition. Repeat __________ times. Complete this exercise __________ times a day. Exercise H: Hamstring Curls  If told by your health care provider, do this exercise while wearing ankle weights. Begin with __________ weights. Then increase the weight by 1 lb (0.5 kg) increments. Do not wear ankle weights that are more than __________. 1. Lie on your abdomen with your legs straight. 2. Bend your left / right knee as far as you can without feeling pain. Keep your hips flat against the floor. 3. Hold this position for __________ seconds. 4. Slowly lower your leg to the starting position.  Repeat __________ times. Complete this exercise  __________ times a day. Exercise I: Squats (Quadriceps) 1. Stand in front of a table, with your feet and knees pointing straight ahead. You may rest your hands on the table for balance but not for support. 2. Slowly bend your knees and lower your hips like you are going to sit in a chair. ? Keep your weight over your heels, not over your toes. ? Keep your lower legs upright so they are parallel with the table legs. ? Do not let your hips go lower than your knees. ? Do not bend lower than told by your health care provider. ? If your knee pain increases, do not bend as low. 3. Hold the squat position for __________ seconds. 4. Slowly push with your legs to return to standing. Do not use your hands to pull yourself to standing. Repeat __________ times. Complete this exercise __________ times a day. Exercise J: Wall Slides (Quadriceps)  1. Lean your back against a smooth wall or door while  you walk your feet out 18-24 inches (46-61 cm) from it. 2. Place your feet hip-width apart. 3. Slowly slide down the wall or door until your knees bend __________ degrees. Keep your knees over your heels, not over your toes. Keep your knees in line with your hips. 4. Hold for __________ seconds. Repeat __________ times. Complete this exercise __________ times a day. Exercise K: Straight Leg Raises - Hip Abductors 1. Lie on your side with your left / right leg in the top position. Lie so your head, shoulder, knee, and hip line up. You may bend your bottom knee to help you keep your balance. 2. Roll your hips slightly forward so your hips are stacked directly over each other and your left / right knee is facing forward. 3. Leading with your heel, lift your top leg 4-6 inches (10-15 cm). You should feel the muscles in your outer hip lifting. ? Do not let your foot drift forward. ? Do not let your knee roll toward the ceiling. 4. Hold this position for __________ seconds. 5. Slowly return your leg to the starting  position. 6. Let your muscles relax completely after each repetition. Repeat __________ times. Complete this exercise __________ times a day. Exercise L: Straight Leg Raises - Hip Extensors 1. Lie on your abdomen on a firm surface. You can put a pillow under your hips if that is more comfortable. 2. Tense the muscles in your buttocks and lift your left / right leg about 4-6 inches (10-15 cm). Keep your knee straight as you lift your leg. 3. Hold this position for __________ seconds. 4. Slowly lower your leg to the starting position. 5. Let your leg relax completely after each repetition. Repeat __________ times. Complete this exercise __________ times a day. This information is not intended to replace advice given to you by your health care provider. Make sure you discuss any questions you have with your health care provider. Document Released: 02/07/2005 Document Revised: 12/19/2015 Document Reviewed: 01/30/2015 Elsevier Interactive Patient Education  2018 [place].  Back Exercises The following exercises strengthen the muscles that help to support the back. They also help to keep the lower back flexible. Doing these exercises can help to prevent back pain or lessen existing pain. If you have back pain or discomfort, try doing these exercises 2-3 times each day or as told by your health care provider. When the pain goes away, do them once each day, but increase the number of times that you repeat the steps for each exercise (do more repetitions). If you do not have back pain or discomfort, do these exercises once each day or as told by your health care provider. Exercises Single Knee to Chest  Repeat these steps 3-5 times for each leg: 6. Lie on your back on a firm bed or the floor with your legs extended. 7. Bring one knee to your chest. Your other leg should stay extended and in contact with the floor. 8. Hold your knee in place by grabbing your knee or thigh. 9. Pull on your knee  until you feel a gentle stretch in your lower back. 10. Hold the stretch for 10-30 seconds. 11. Slowly release and straighten your leg.  Pelvic Tilt  Repeat these steps 5-10 times: 5. Lie on your back on a firm bed or the floor with your legs extended. 6. Bend your knees so they are pointing toward the ceiling and your feet are flat on the floor. 7. Tighten your lower abdominal muscles  to press your lower back against the floor. This motion will tilt your pelvis so your tailbone points up toward the ceiling instead of pointing to your feet or the floor. 8. With gentle tension and even breathing, hold this position for 5-10 seconds.  Cat-Cow  Repeat these steps until your lower back becomes more flexible: 5. Get into a hands-and-knees position on a firm surface. Keep your hands under your shoulders, and keep your knees under your hips. You may place padding under your knees for comfort. 6. Let your head hang down, and point your tailbone toward the floor so your lower back becomes rounded like the back of a cat. 7. Hold this position for 5 seconds. 8. Slowly lift your head and point your tailbone up toward the ceiling so your back forms a sagging arch like the back of a cow. 9. Hold this position for 5 seconds.  Press-Ups  Repeat these steps 5-10 times: 1. Lie on your abdomen (face-down) on the floor. 2. Place your palms near your head, about shoulder-width apart. 3. While you keep your back as relaxed as possible and keep your hips on the floor, slowly straighten your arms to raise the top half of your body and lift your shoulders. Do not use your back muscles to raise your upper torso. You may adjust the placement of your hands to make yourself more comfortable. 4. Hold this position for 5 seconds while you keep your back relaxed. 5. Slowly return to lying flat on the floor.  Bridges  Repeat these steps 10 times: 5. Lie on your back on a firm surface. 6. Bend your knees so they are  pointing toward the ceiling and your feet are flat on the floor. 7. Tighten your buttocks muscles and lift your buttocks off of the floor until your waist is at almost the same height as your knees. You should feel the muscles working in your buttocks and the back of your thighs. If you do not feel these muscles, slide your feet 1-2 inches farther away from your buttocks. 8. Hold this position for 3-5 seconds. 9. Slowly lower your hips to the starting position, and allow your buttocks muscles to relax completely.  If this exercise is too easy, try doing it with your arms crossed over your chest. Abdominal Crunches  Repeat these steps 5-10 times: 7. Lie on your back on a firm bed or the floor with your legs extended. 8. Bend your knees so they are pointing toward the ceiling and your feet are flat on the floor. 9. Cross your arms over your chest. 10. Tip your chin slightly toward your chest without bending your neck. 76. Tighten your abdominal muscles and slowly raise your trunk (torso) high enough to lift your shoulder blades a tiny bit off of the floor. Avoid raising your torso higher than that, because it can put too much stress on your low back and it does not help to strengthen your abdominal muscles. 12. Slowly return to your starting position.  Back Lifts Repeat these steps 5-10 times: 6. Lie on your abdomen (face-down) with your arms at your sides, and rest your forehead on the floor. 7. Tighten the muscles in your legs and your buttocks. 8. Slowly lift your chest off of the floor while you keep your hips pressed to the floor. Keep the back of your head in line with the curve in your back. Your eyes should be looking at the floor. 9. Hold this position for 3-5  seconds. 10. Slowly return to your starting position.  Contact a health care provider if:  Your back pain or discomfort gets much worse when you do an exercise.  Your back pain or discomfort does not lessen within 2 hours  after you exercise. If you have any of these problems, stop doing these exercises right away. Do not do them again unless your health care provider says that you can. Get help right away if:  You develop sudden, severe back pain. If this happens, stop doing the exercises right away. Do not do them again unless your health care provider says that you can. This information is not intended to replace advice given to you by your health care provider. Make sure you discuss any questions you have with your health care provider. Document Released: 05/03/2004 Document Revised: 08/03/2015 Document Reviewed: 05/20/2014 Elsevier Interactive Patient Education  2017 [employer].

## 2016-10-20 LAB — SEDIMENTATION RATE: Sed Rate: 48 mm/hr — ABNORMAL HIGH (ref 0–30)

## 2016-10-22 ENCOUNTER — Telehealth: Payer: Self-pay | Admitting: Rheumatology

## 2016-10-22 DIAGNOSIS — E038 Other specified hypothyroidism: Secondary | ICD-10-CM | POA: Diagnosis not present

## 2016-10-22 DIAGNOSIS — Z6834 Body mass index (BMI) 34.0-34.9, adult: Secondary | ICD-10-CM | POA: Diagnosis not present

## 2016-10-22 DIAGNOSIS — E668 Other obesity: Secondary | ICD-10-CM | POA: Diagnosis not present

## 2016-10-22 DIAGNOSIS — Z1389 Encounter for screening for other disorder: Secondary | ICD-10-CM | POA: Diagnosis not present

## 2016-10-22 DIAGNOSIS — D8989 Other specified disorders involving the immune mechanism, not elsewhere classified: Secondary | ICD-10-CM | POA: Diagnosis not present

## 2016-10-22 DIAGNOSIS — E1122 Type 2 diabetes mellitus with diabetic chronic kidney disease: Secondary | ICD-10-CM | POA: Diagnosis not present

## 2016-10-22 DIAGNOSIS — H20013 Primary iridocyclitis, bilateral: Secondary | ICD-10-CM | POA: Diagnosis not present

## 2016-10-22 DIAGNOSIS — I129 Hypertensive chronic kidney disease with stage 1 through stage 4 chronic kidney disease, or unspecified chronic kidney disease: Secondary | ICD-10-CM | POA: Diagnosis not present

## 2016-10-22 DIAGNOSIS — M1990 Unspecified osteoarthritis, unspecified site: Secondary | ICD-10-CM | POA: Diagnosis not present

## 2016-10-22 LAB — CYCLIC CITRUL PEPTIDE ANTIBODY, IGG: Cyclic Citrullin Peptide Ab: <16 Units

## 2016-10-22 LAB — ANGIOTENSIN CONVERTING ENZYME: Angiotensin-Converting Enzyme: 17 U/L (ref 9–67)

## 2016-10-22 LAB — PAN-ANCA
ANCA Screen: POSITIVE — AB
Myeloperoxidase Abs: <1
Serine Protease 3: <1

## 2016-10-22 LAB — RFLX ATYPICAL P-ANCA TITER: Atypical P-ANCA titer: 1:20 {titer} — ABNORMAL HIGH

## 2016-10-22 NOTE — Telephone Encounter (Signed)
Patient has some questions about the supplements Dr. Estanislado Pandy wanted her to take.

## 2016-10-23 ENCOUNTER — Telehealth: Payer: Self-pay | Admitting: Rheumatology

## 2016-10-23 LAB — 14-3-3 ETA PROTEIN: 14-3-3 eta Protein: <0.2 ng/mL (ref <0.2)

## 2016-10-23 NOTE — Telephone Encounter (Signed)
Patient advised she is to take all the supplements suggested. Patient verbalized understanding.

## 2016-10-23 NOTE — Telephone Encounter (Signed)
Patient would like to know if she needs to get all the supplements doctor recommends, or if she can try one, or two. Is it okay to use all together? Please call to advise.

## 2016-10-23 NOTE — Telephone Encounter (Signed)
Attempted to contact the patient and unable to leave a message, mailbox is full.  

## 2016-10-23 NOTE — Telephone Encounter (Signed)
See previous phone message. 

## 2016-10-24 LAB — HLA-B27 ANTIGEN: DNA Result:: NEGATIVE

## 2016-10-25 NOTE — Progress Notes (Signed)
Will discuss at follow up visit

## 2016-11-13 DIAGNOSIS — M170 Bilateral primary osteoarthritis of knee: Secondary | ICD-10-CM | POA: Insufficient documentation

## 2016-11-13 DIAGNOSIS — M25572 Pain in left ankle and joints of left foot: Secondary | ICD-10-CM | POA: Insufficient documentation

## 2016-11-13 NOTE — Progress Notes (Signed)
Office Visit Note  Patient: Tabitha Moore             Date of Birth: August 04, 1940           MRN: [id number]             PCP: Shon Baton, MD Referring: Shon Baton, MD Visit Date: 11/20/2016 Occupation: _0 @    Subjective:  Pain in joints.   History of Present Illness: Tabitha Moore is a 76 y.o. female with history of chorioretinitis and osteoarthritis she states she has a started some natural anti-inflammatories and has noticed some improvement in her joint symptoms. She continues to have some joint discomfort in her knees ankles and lower back. The discomfort is usually worse during the winter months. She states she has not had any flare of her eye symptoms. She's been taking methotrexate 4 tablets by mouth every week. Which is been prescribed by Dr. Manuella Ghazi.  Activities of Daily Living Patient reports morning stiffness for 15  minute.   Patient Denies nocturnal pain.  Difficulty dressing/grooming: Denies Difficulty climbing stairs: Denies Difficulty getting out of chair: Denies Difficulty using hands for taps, buttons, cutlery, and/or writing: Denies   Review of Systems  Constitutional: Negative for fatigue, night sweats, weight gain, weight loss and weakness.  HENT: Negative for mouth sores, trouble swallowing, trouble swallowing, mouth dryness and nose dryness.   Eyes: Positive for dryness. Negative for pain, redness and visual disturbance.  Respiratory: Negative.  Negative for cough, shortness of breath and difficulty breathing.   Cardiovascular: Negative.  Negative for chest pain, palpitations, hypertension, irregular heartbeat and swelling in legs/feet.  Gastrointestinal: Positive for constipation. Negative for blood in stool and diarrhea.  Endocrine: Negative.  Negative for increased urination.  Genitourinary: Negative.  Negative for nocturia and vaginal dryness.  Musculoskeletal: Positive for morning stiffness. Negative for arthralgias, joint pain, joint swelling,  myalgias, muscle weakness, muscle tenderness and myalgias.  Skin: Negative.  Negative for color change, rash, hair loss, skin tightness, ulcers and sensitivity to sunlight.  Allergic/Immunologic: Negative for susceptible to infections.  Neurological: Negative.  Negative for dizziness, headaches, memory loss and night sweats.  Hematological: Negative.  Negative for swollen glands.  Psychiatric/Behavioral: Negative.  Negative for depressed mood and sleep disturbance. The patient is not nervous/anxious.     PMFS History:  Patient Active Problem List   Diagnosis Date Noted  . High risk medication use 11/17/2016  . DDD (degenerative disc disease), lumbar 11/17/2016  . Primary osteoarthritis of both knees 11/13/2016  . Pain in left ankle and joints of left foot 11/13/2016  . Essential hypertension 10/18/2016  . Left ventricular hypertrophy 10/18/2016  . PVC's (premature ventricular contractions) 10/18/2016  . History of diabetes mellitus 10/18/2016  . History of hypothyroidism 10/18/2016  . Dyslipidemia 10/18/2016  . Obesity (BMI 35.0-39.9 without comorbidity) 10/18/2016  . History of GI diverticular bleed 10/18/2016  . Stage 4 chronic kidney disease ([facility]) 10/18/2016  . 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 Internal and external prolapsed hemorrhoids, bleeding. 10/05/2011    Past Medical History:  Diagnosis Date  . Arthritis   . Complication of anesthesia    pt states "hard to wake up"  . Diabetes mellitus   . Hemorrhoids   . Hyperlipidemia   . Hypertension   . Hypothyroidism     Family History  Problem Relation Age of Onset  . Cancer Father        lung  . Cancer Sister  breast   Past Surgical History:  Procedure Laterality Date  . ABDOMINAL HYSTERECTOMY    . BREAST LUMPECTOMY WITH RADIOACTIVE SEED LOCALIZATION Left 08/27/2014   Procedure: BREAST LUMPECTOMY WITH RADIOACTIVE SEED LOCALIZATION;  Surgeon: Autumn Messing III, MD;  Location: [facility];   Service: General;  Laterality: Left;  . CHOLECYSTECTOMY     Social History   Social History Narrative  . No narrative on file     Objective: Vital Signs: BP 126/73   Pulse 95   Resp 14   Ht '5\' 3"'$  (1.6 m)   Wt 194 lb (88 kg)   BMI 34.37 kg/m    Physical Exam  Constitutional: She is oriented to person, place, and time. She appears well-developed and well-nourished.  HENT:  Head: Normocephalic and atraumatic.  Eyes: Conjunctivae and EOM are normal.  Neck: Normal range of motion.  Cardiovascular: Normal rate, regular rhythm, normal heart sounds and intact distal pulses.   Pulmonary/Chest: Effort normal and breath sounds normal.  Abdominal: Soft. Bowel sounds are normal.  Lymphadenopathy:    She has no cervical adenopathy.  Neurological: She is alert and oriented to person, place, and time.  Skin: Skin is warm and dry. Capillary refill takes less than 2 seconds.  Psychiatric: She has a normal mood and affect. Her behavior is normal.  Nursing note and vitals reviewed.    Musculoskeletal Exam: C-spine and thoracic lumbar spine good range of motion. Shoulder joints elbow joints wrist joint MCPs PIPs DIPs with good range of motion. She is some thickening of PIP/DIP joints in her hands. Hip joints knee joints ankles MTPs PIPs DIPs with good range of motion. She is some crepitus in her bilateral knee joints without any warmth swelling or effusion.  CDAI Exam: No CDAI exam completed.    Investigation: No additional findings. 10/19/2016 UA +WBCs , epithelial cells and few bacteria, ESR 48, ACE negative, anti-CCPneg, 14-3-3n neg, P-ANCA 1:20, HLA B-27 neg   Imaging: No results found.  Speciality Comments: No specialty comments available.    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 She has no clinical features of  vasculitis. Patient denies any recent flares. Her symptoms are well-controlled on methotrexate. She is only on low-dose methotrexate.  High risk medication use - Methotrexate 4 tabs po ZOXWR(60/4540), Folic acid 1 mg po qd from Dr Manuella Ghazi opthalmologist at [facility]. Her labs are monitored by Dr. Manuella Ghazi.  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 Weight loss diet and exercise was discussed. I've also given her a prescription for Voltaren gel which can be used topically.  Pain in left ankle and joints of left foot: She does not have any synovitis on examination.  DDD (degenerative disc disease), lumbar: Chronic pain. I've given her a handout for back exercises.  Her other medical problems are listed as follows:  History of diabetes mellitus  Obesity (BMI 35.0-39.9 without comorbidity)  Essential hypertension  Dyslipidemia  Left ventricular hypertrophy  Stage 4 chronic kidney disease ([facility]) - GFR in 59s  History of GI diverticular bleed  History of hypothyroidism    Orders: No orders of the defined types were placed in this encounter.  Meds ordered this encounter  Medications  . diclofenac sodium (VOLTAREN) 1 % GEL    Sig: Apply 3 gm to 3 large joints up to 3 times  a day.Dispense 3 tubes with 3 refills.    Dispense:  3 Tube    Refill:  3    Face-to-face time spent with patient was 30 minutes. Greater than 50% of time was spent in counseling and coordination of care.  Follow-Up Instructions: Return in about 6 months (around 05/23/2017) for Osteoarthritis.   Bo Merino, MD  Note - This record has been created using Editor, commissioning.  Chart creation errors have been sought, but may not always  have been located. Such creation errors do not reflect on  the standard of medical care.

## 2016-11-17 DIAGNOSIS — M51369 Other intervertebral disc degeneration, lumbar region without mention of lumbar back pain or lower extremity pain: Secondary | ICD-10-CM | POA: Insufficient documentation

## 2016-11-17 DIAGNOSIS — Z79899 Other long term (current) drug therapy: Secondary | ICD-10-CM | POA: Insufficient documentation

## 2016-11-17 DIAGNOSIS — M5136 Other intervertebral disc degeneration, lumbar region: Secondary | ICD-10-CM | POA: Insufficient documentation

## 2016-11-20 ENCOUNTER — Telehealth: Payer: Self-pay

## 2016-11-20 ENCOUNTER — Encounter: Payer: Self-pay | Admitting: Rheumatology

## 2016-11-20 ENCOUNTER — Ambulatory Visit (INDEPENDENT_AMBULATORY_CARE_PROVIDER_SITE_OTHER): Payer: Medicare Other | Admitting: Rheumatology

## 2016-11-20 VITALS — BP 126/73 | HR 95 | Resp 14 | Ht 63.0 in | Wt 194.0 lb

## 2016-11-20 DIAGNOSIS — I517 Cardiomegaly: Secondary | ICD-10-CM

## 2016-11-20 DIAGNOSIS — M25572 Pain in left ankle and joints of left foot: Secondary | ICD-10-CM

## 2016-11-20 DIAGNOSIS — H3093 Unspecified chorioretinal inflammation, bilateral: Secondary | ICD-10-CM | POA: Diagnosis not present

## 2016-11-20 DIAGNOSIS — Z8639 Personal history of other endocrine, nutritional and metabolic disease: Secondary | ICD-10-CM

## 2016-11-20 DIAGNOSIS — E785 Hyperlipidemia, unspecified: Secondary | ICD-10-CM | POA: Diagnosis not present

## 2016-11-20 DIAGNOSIS — E669 Obesity, unspecified: Secondary | ICD-10-CM

## 2016-11-20 DIAGNOSIS — M5136 Other intervertebral disc degeneration, lumbar region: Secondary | ICD-10-CM

## 2016-11-20 DIAGNOSIS — Z79899 Other long term (current) drug therapy: Secondary | ICD-10-CM | POA: Diagnosis not present

## 2016-11-20 DIAGNOSIS — N184 Chronic kidney disease, stage 4 (severe): Secondary | ICD-10-CM

## 2016-11-20 DIAGNOSIS — M170 Bilateral primary osteoarthritis of knee: Secondary | ICD-10-CM | POA: Diagnosis not present

## 2016-11-20 DIAGNOSIS — I10 Essential (primary) hypertension: Secondary | ICD-10-CM | POA: Diagnosis not present

## 2016-11-20 DIAGNOSIS — Z8719 Personal history of other diseases of the digestive system: Secondary | ICD-10-CM

## 2016-11-20 MED ORDER — DICLOFENAC SODIUM 1 % TD GEL: TRANSDERMAL | 3 refills | Status: DC

## 2016-11-20 NOTE — Patient Instructions (Signed)
Knee Exercises Ask your health care provider which exercises are safe for you. Do exercises exactly as told by your health care provider and adjust them as directed. It is normal to feel mild stretching, pulling, tightness, or discomfort as you do these exercises, but you should stop right away if you feel sudden pain or your pain gets worse.Do not begin these exercises until told by your health care provider. STRETCHING AND RANGE OF MOTION EXERCISES These exercises warm up your muscles and joints and improve the movement and flexibility of your knee. These exercises also help to relieve pain, numbness, and tingling. Exercise A: Knee Extension, Prone 1. Lie on your abdomen on a bed. 2. Place your left / right knee just beyond the edge of the surface so your knee is not on the bed. You can put a towel under your left / right thigh just above your knee for comfort. 3. Relax your leg muscles and allow gravity to straighten your knee. You should feel a stretch behind your left / right knee. 4. Hold this position for __________ seconds. 5. Scoot up so your knee is supported between repetitions. Repeat __________ times. Complete this stretch __________ times a day. Exercise B: Knee Flexion, Active  1. Lie on your back with both knees straight. If this causes back discomfort, bend your left / right knee so your foot is flat on the floor. 2. Slowly slide your left / right heel back toward your buttocks until you feel a gentle stretch in the front of your knee or thigh. 3. Hold this position for __________ seconds. 4. Slowly slide your left / right heel back to the starting position. Repeat __________ times. Complete this exercise __________ times a day. Exercise C: Quadriceps, Prone  1. Lie on your abdomen on a firm surface, such as a bed or padded floor. 2. Bend your left / right knee and hold your ankle. If you cannot reach your ankle or pant leg, loop a belt around your foot and grab the belt  instead. 3. Gently pull your heel toward your buttocks. Your knee should not slide out to the side. You should feel a stretch in the front of your thigh and knee. 4. Hold this position for __________ seconds. Repeat __________ times. Complete this stretch __________ times a day. Exercise D: Hamstring, Supine 1. Lie on your back. 2. Loop a belt or towel over the ball of your left / right foot. The ball of your foot is on the walking surface, right under your toes. 3. Straighten your left / right knee and slowly pull on the belt to raise your leg until you feel a gentle stretch behind your knee. ? Do not let your left / right knee bend while you do this. ? Keep your other leg flat on the floor. 4. Hold this position for __________ seconds. Repeat __________ times. Complete this stretch __________ times a day. STRENGTHENING EXERCISES These exercises build strength and endurance in your knee. Endurance is the ability to use your muscles for a long time, even after they get tired. Exercise E: Quadriceps, Isometric  1. Lie on your back with your left / right leg extended and your other knee bent. Put a rolled towel or small pillow under your knee if told by your health care provider. 2. Slowly tense the muscles in the front of your left / right thigh. You should see your kneecap slide up toward your hip or see increased dimpling just above the knee. This   motion will push the back of the knee toward the floor. 3. For __________ seconds, keep the muscle as tight as you can without increasing your pain. 4. Relax the muscles slowly and completely. Repeat __________ times. Complete this exercise __________ times a day. Exercise F: Straight Leg Raises - Quadriceps 1. Lie on your back with your left / right leg extended and your other knee bent. 2. Tense the muscles in the front of your left / right thigh. You should see your kneecap slide up or see increased dimpling just above the knee. Your thigh may  even shake a bit. 3. Keep these muscles tight as you raise your leg 4-6 inches (10-15 cm) off the floor. Do not let your knee bend. 4. Hold this position for __________ seconds. 5. Keep these muscles tense as you lower your leg. 6. Relax your muscles slowly and completely after each repetition. Repeat __________ times. Complete this exercise __________ times a day. Exercise G: Hamstring, Isometric 1. Lie on your back on a firm surface. 2. Bend your left / right knee approximately __________ degrees. 3. Dig your left / right heel into the surface as if you are trying to pull it toward your buttocks. Tighten the muscles in the back of your thighs to dig as hard as you can without increasing any pain. 4. Hold this position for __________ seconds. 5. Release the tension gradually and allow your muscles to relax completely for __________ seconds after each repetition. Repeat __________ times. Complete this exercise __________ times a day. Exercise H: Hamstring Curls  If told by your health care provider, do this exercise while wearing ankle weights. Begin with __________ weights. Then increase the weight by 1 lb (0.5 kg) increments. Do not wear ankle weights that are more than __________. 1. Lie on your abdomen with your legs straight. 2. Bend your left / right knee as far as you can without feeling pain. Keep your hips flat against the floor. 3. Hold this position for __________ seconds. 4. Slowly lower your leg to the starting position.  Repeat __________ times. Complete this exercise __________ times a day. Exercise I: Squats (Quadriceps) 1. Stand in front of a table, with your feet and knees pointing straight ahead. You may rest your hands on the table for balance but not for support. 2. Slowly bend your knees and lower your hips like you are going to sit in a chair. ? Keep your weight over your heels, not over your toes. ? Keep your lower legs upright so they are parallel with the table  legs. ? Do not let your hips go lower than your knees. ? Do not bend lower than told by your health care provider. ? If your knee pain increases, do not bend as low. 3. Hold the squat position for __________ seconds. 4. Slowly push with your legs to return to standing. Do not use your hands to pull yourself to standing. Repeat __________ times. Complete this exercise __________ times a day. Exercise J: Wall Slides (Quadriceps)  1. Lean your back against a smooth wall or door while you walk your feet out 18-24 inches (46-61 cm) from it. 2. Place your feet hip-width apart. 3. Slowly slide down the wall or door until your knees bend __________ degrees. Keep your knees over your heels, not over your toes. Keep your knees in line with your hips. 4. Hold for __________ seconds. Repeat __________ times. Complete this exercise __________ times a day. Exercise K: Straight Leg Raises -   Hip Abductors 1. Lie on your side with your left / right leg in the top position. Lie so your head, shoulder, knee, and hip line up. You may bend your bottom knee to help you keep your balance. 2. Roll your hips slightly forward so your hips are stacked directly over each other and your left / right knee is facing forward. 3. Leading with your heel, lift your top leg 4-6 inches (10-15 cm). You should feel the muscles in your outer hip lifting. ? Do not let your foot drift forward. ? Do not let your knee roll toward the ceiling. 4. Hold this position for __________ seconds. 5. Slowly return your leg to the starting position. 6. Let your muscles relax completely after each repetition. Repeat __________ times. Complete this exercise __________ times a day. Exercise L: Straight Leg Raises - Hip Extensors 1. Lie on your abdomen on a firm surface. You can put a pillow under your hips if that is more comfortable. 2. Tense the muscles in your buttocks and lift your left / right leg about 4-6 inches (10-15 cm). Keep your knee  straight as you lift your leg. 3. Hold this position for __________ seconds. 4. Slowly lower your leg to the starting position. 5. Let your leg relax completely after each repetition. Repeat __________ times. Complete this exercise __________ times a day. This information is not intended to replace advice given to you by your health care provider. Make sure you discuss any questions you have with your health care provider. Document Released: 02/07/2005 Document Revised: 12/19/2015 Document Reviewed: 01/30/2015 [employer] Interactive Patient Education  2018 [employer] Back Exercises The following exercises strengthen the muscles that help to support the back. They also help to keep the lower back flexible. Doing these exercises can help to prevent back pain or lessen existing pain. If you have back pain or discomfort, try doing these exercises 2-3 times each day or as told by your health care provider. When the pain goes away, do them once each day, but increase the number of times that you repeat the steps for each exercise (do more repetitions). If you do not have back pain or discomfort, do these exercises once each day or as told by your health care provider. Exercises Single Knee to Chest  Repeat these steps 3-5 times for each leg: 6. Lie on your back on a firm bed or the floor with your legs extended. 7. Bring one knee to your chest. Your other leg should stay extended and in contact with the floor. 8. Hold your knee in place by grabbing your knee or thigh. 9. Pull on your knee until you feel a gentle stretch in your lower back. 10. Hold the stretch for 10-30 seconds. 11. Slowly release and straighten your leg.  Pelvic Tilt  Repeat these steps 5-10 times: 5. Lie on your back on a firm bed or the floor with your legs extended. 6. Bend your knees so they are pointing toward the ceiling and your feet are flat on the floor. 7. Tighten your lower abdominal muscles to press your lower  back against the floor. This motion will tilt your pelvis so your tailbone points up toward the ceiling instead of pointing to your feet or the floor. 8. With gentle tension and even breathing, hold this position for 5-10 seconds.  Cat-Cow  Repeat these steps until your lower back becomes more flexible: 5. Get into a hands-and-knees position on a firm surface. Keep your hands under   your shoulders, and keep your knees under your hips. You may place padding under your knees for comfort. 6. Let your head hang down, and point your tailbone toward the floor so your lower back becomes rounded like the back of a cat. 7. Hold this position for 5 seconds. 8. Slowly lift your head and point your tailbone up toward the ceiling so your back forms a sagging arch like the back of a cow. 9. Hold this position for 5 seconds.  Press-Ups  Repeat these steps 5-10 times: 1. Lie on your abdomen (face-down) on the floor. 2. Place your palms near your head, about shoulder-width apart. 3. While you keep your back as relaxed as possible and keep your hips on the floor, slowly straighten your arms to raise the top half of your body and lift your shoulders. Do not use your back muscles to raise your upper torso. You may adjust the placement of your hands to make yourself more comfortable. 4. Hold this position for 5 seconds while you keep your back relaxed. 5. Slowly return to lying flat on the floor.  Bridges  Repeat these steps 10 times: 5. Lie on your back on a firm surface. 6. Bend your knees so they are pointing toward the ceiling and your feet are flat on the floor. 7. Tighten your buttocks muscles and lift your buttocks off of the floor until your waist is at almost the same height as your knees. You should feel the muscles working in your buttocks and the back of your thighs. If you do not feel these muscles, slide your feet 1-2 inches farther away from your buttocks. 8. Hold this position for 3-5  seconds. 9. Slowly lower your hips to the starting position, and allow your buttocks muscles to relax completely.  If this exercise is too easy, try doing it with your arms crossed over your chest. Abdominal Crunches  Repeat these steps 5-10 times: 7. Lie on your back on a firm bed or the floor with your legs extended. 8. Bend your knees so they are pointing toward the ceiling and your feet are flat on the floor. 9. Cross your arms over your chest. 10. Tip your chin slightly toward your chest without bending your neck. 11. Tighten your abdominal muscles and slowly raise your trunk (torso) high enough to lift your shoulder blades a tiny bit off of the floor. Avoid raising your torso higher than that, because it can put too much stress on your low back and it does not help to strengthen your abdominal muscles. 12. Slowly return to your starting position.  Back Lifts Repeat these steps 5-10 times: 6. Lie on your abdomen (face-down) with your arms at your sides, and rest your forehead on the floor. 7. Tighten the muscles in your legs and your buttocks. 8. Slowly lift your chest off of the floor while you keep your hips pressed to the floor. Keep the back of your head in line with the curve in your back. Your eyes should be looking at the floor. 9. Hold this position for 3-5 seconds. 10. Slowly return to your starting position.  Contact a health care provider if:  Your back pain or discomfort gets much worse when you do an exercise.  Your back pain or discomfort does not lessen within 2 hours after you exercise. If you have any of these problems, stop doing these exercises right away. Do not do them again unless your health care provider says that you can.   Get help right away if:  You develop sudden, severe back pain. If this happens, stop doing the exercises right away. Do not do them again unless your health care provider says that you can. This information is not intended to replace  advice given to you by your health care provider. Make sure you discuss any questions you have with your health care provider. Document Released: 05/03/2004 Document Revised: 08/03/2015 Document Reviewed: 05/20/2014 Elsevier Interactive Patient Education  2017 [employer]  

## 2016-11-20 NOTE — Telephone Encounter (Signed)
A prior authorization for Voltaren Gel has been submitted to patient's insurance via cover my meds. Will update once we receive a response.   Suleima Ohlendorf, [place], CPhT 11:46 AM

## 2016-11-20 NOTE — Progress Notes (Signed)
Patient was prescribed Voltaren gel during today's visit.  I counseled patient on purpose, proper use, and adverse effects of Voltaren gel.  Discussed that insurance often requires a prior authorization and we will complete PA if required.  If medication is not covered by her insurance, discussed use of GoodRx coupon card.  I provided patient with a GoodRx coupon card.  Patient denies any questions or concerns regarding her medications at this time.   Elisabeth Most, Pharm.D., BCPS, CPP Clinical Pharmacist Pager: [phone] Phone: [phone] 11/20/2016 10:09 AM

## 2016-11-21 ENCOUNTER — Telehealth: Payer: Self-pay | Admitting: Rheumatology

## 2016-11-21 NOTE — Telephone Encounter (Signed)
Patient returned call. Informed her of the denial of the Diclofenac Gel with her insurance. She was given a goodRx coupon to use at the pharmacy at her last visit. Patient will use the coupon.   Yamen Castrogiovanni, [place], CPhT 4:28 PM

## 2016-11-21 NOTE — Telephone Encounter (Signed)
Received a fax from [facility] regarding a prior authorization DENIAL for Diclofenac Gel.   Reference [id number] Phone [phone]  Will send document to scan center.  Called patient to update her. Could not leave a message  Terre Zabriskie, [place], CPhT  2:33 PM

## 2016-11-21 NOTE — Telephone Encounter (Signed)
Patient returning your call.

## 2016-11-21 NOTE — Telephone Encounter (Signed)
Returned patient's call. Did not get an answer. Could not leave a message.  Miche Loughridge, [place], CPhT 3:41 PM

## 2016-12-17 ENCOUNTER — Emergency Department (HOSPITAL_COMMUNITY)
Admission: EM | Admit: 2016-12-17 | Discharge: 2016-12-17 | Disposition: A | Discharge status: Home / Self Care | Payer: Medicare Other | Attending: Emergency Medicine | Admitting: Emergency Medicine

## 2016-12-17 DIAGNOSIS — N184 Chronic kidney disease, stage 4 (severe): Secondary | ICD-10-CM | POA: Insufficient documentation

## 2016-12-17 DIAGNOSIS — E039 Hypothyroidism, unspecified: Secondary | ICD-10-CM | POA: Diagnosis not present

## 2016-12-17 DIAGNOSIS — D649 Anemia, unspecified: Secondary | ICD-10-CM | POA: Diagnosis not present

## 2016-12-17 DIAGNOSIS — R195 Other fecal abnormalities: Secondary | ICD-10-CM | POA: Diagnosis not present

## 2016-12-17 DIAGNOSIS — I129 Hypertensive chronic kidney disease with stage 1 through stage 4 chronic kidney disease, or unspecified chronic kidney disease: Secondary | ICD-10-CM | POA: Insufficient documentation

## 2016-12-17 DIAGNOSIS — R404 Transient alteration of awareness: Secondary | ICD-10-CM | POA: Diagnosis not present

## 2016-12-17 DIAGNOSIS — E785 Hyperlipidemia, unspecified: Secondary | ICD-10-CM | POA: Insufficient documentation

## 2016-12-17 DIAGNOSIS — R531 Weakness: Secondary | ICD-10-CM | POA: Diagnosis not present

## 2016-12-17 DIAGNOSIS — K921 Melena: Secondary | ICD-10-CM | POA: Diagnosis not present

## 2016-12-17 DIAGNOSIS — K219 Gastro-esophageal reflux disease without esophagitis: Secondary | ICD-10-CM | POA: Diagnosis not present

## 2016-12-17 DIAGNOSIS — Z6834 Body mass index (BMI) 34.0-34.9, adult: Secondary | ICD-10-CM | POA: Diagnosis not present

## 2016-12-17 DIAGNOSIS — E119 Type 2 diabetes mellitus without complications: Secondary | ICD-10-CM | POA: Insufficient documentation

## 2016-12-17 DIAGNOSIS — N183 Chronic kidney disease, stage 3 (moderate): Secondary | ICD-10-CM | POA: Diagnosis not present

## 2016-12-17 DIAGNOSIS — Z79899 Other long term (current) drug therapy: Secondary | ICD-10-CM | POA: Diagnosis not present

## 2016-12-17 DIAGNOSIS — K922 Gastrointestinal hemorrhage, unspecified: Secondary | ICD-10-CM | POA: Diagnosis not present

## 2016-12-17 LAB — BASIC METABOLIC PANEL
Anion gap: 10 (ref 5–15)
BUN: 56 mg/dL — ABNORMAL HIGH (ref 6–20)
CO2: 29 mmol/L (ref 22–32)
Calcium: 8.8 mg/dL — ABNORMAL LOW (ref 8.9–10.3)
Chloride: 96 mmol/L — ABNORMAL LOW (ref 101–111)
Creatinine, Ser: 1.14 mg/dL — ABNORMAL HIGH (ref 0.44–1.00)
GFR calc Af Amer: 52 mL/min — ABNORMAL LOW (ref >60)
GFR calc non Af Amer: 45 mL/min — ABNORMAL LOW (ref >60)
Glucose, Bld: 220 mg/dL — ABNORMAL HIGH (ref 65–99)
Potassium: 3.8 mmol/L (ref 3.5–5.1)
Sodium: 135 mmol/L (ref 135–145)

## 2016-12-17 LAB — CBC WITH DIFFERENTIAL/PLATELET
Basophils Absolute: 0 10*3/uL (ref 0.0–0.1)
Basophils Relative: 0 %
Eosinophils Absolute: 0 10*3/uL (ref 0.0–0.7)
Eosinophils Relative: 0 %
HCT: 33.6 % — ABNORMAL LOW (ref 36.0–46.0)
Hemoglobin: 11 g/dL — ABNORMAL LOW (ref 12.0–15.0)
Lymphocytes Relative: 19 %
Lymphs Abs: 1.9 10*3/uL (ref 0.7–4.0)
MCH: 31.1 pg (ref 26.0–34.0)
MCHC: 32.7 g/dL (ref 30.0–36.0)
MCV: 94.9 fL (ref 78.0–100.0)
Monocytes Absolute: 0.7 10*3/uL (ref 0.1–1.0)
Monocytes Relative: 7 %
Neutro Abs: 7.5 10*3/uL (ref 1.7–7.7)
Neutrophils Relative %: 74 %
Platelets: 308 10*3/uL (ref 150–400)
RBC: 3.54 MIL/uL — ABNORMAL LOW (ref 3.87–5.11)
RDW: 14.7 % (ref 11.5–15.5)
WBC: 10 10*3/uL (ref 4.0–10.5)

## 2016-12-17 LAB — SAMPLE TO BLOOD BANK

## 2016-12-17 MED ORDER — PANTOPRAZOLE SODIUM 40 MG IV SOLR
40.0000 mg | Freq: Once | INTRAVENOUS | Status: AC
  Administered 2016-12-17: 40 mg via INTRAVENOUS
  Filled 2016-12-17: qty 40

## 2016-12-17 MED ORDER — SODIUM CHLORIDE 0.9 % IV SOLN
INTRAVENOUS | Status: DC
  Administered 2016-12-17: 16:00:00 via INTRAVENOUS

## 2016-12-17 NOTE — ED Notes (Signed)
Called Lab and samples are being run currently.

## 2016-12-17 NOTE — ED Notes (Signed)
Pt verbalized understanding discharge instructions and denies any further needs or questions at this time. VS stable, ambulatory and steady gait.   

## 2016-12-17 NOTE — ED Triage Notes (Signed)
Per EMS: Pt had one Tarry dark stool yesterday and weakness w/ vomit x 1 time today. Pt went to PCP today and labs were drawn. Pt was told to come to the Hospital for abnormal lab. Pt is not aware of which lab was abnormal.  A&Ox4, VSS.  Hx of GERD

## 2016-12-17 NOTE — ED Provider Notes (Signed)
[facility] DEPT Provider Note   CSN: [id number] Arrival date & time: 12/17/16  1438     History   Chief Complaint Chief Complaint  Patient presents with  . Abnormal Lab    HPI Tabitha Moore is a 76 y.o. female.  Patient presents from home for evaluation of worsening symptoms.  Earlier today she saw her doctor for an episode of black bowel movement, after 3 days of malaise and decreased appetite.  While at her doctor's office she had Hemoccult testing which was positive for blood and labs were drawn which showed hemoglobin low at 11.1 with elevated BUN 56, and normal creatinine 1.2.  This is consistent with upper GI bleeding.  The patient was discharged home, but called her PCP after vomiting some coffee-ground material, and was instructed to come here.  No prior history of same.  No chest pain, shortness of breath, headache, focal weakness or paresthesia.  No recent change in medication.  She does not take nonsteroidal or aspirin products regularly.  There are no other known modifying factors.  HPI  Past Medical History:  Diagnosis Date  . Arthritis   . Complication of anesthesia    pt states "hard to wake up"  . Diabetes mellitus   . Hemorrhoids   . Hyperlipidemia   . Hypertension   . Hypothyroidism     Patient Active Problem List   Diagnosis Date Noted  . High risk medication use 11/17/2016  . DDD (degenerative disc disease), lumbar 11/17/2016  . Primary osteoarthritis of both knees 11/13/2016  . Pain in left ankle and joints of left foot 11/13/2016  . Essential hypertension 10/18/2016  . Left ventricular hypertrophy 10/18/2016  . PVC's (premature ventricular contractions) 10/18/2016  . History of diabetes mellitus 10/18/2016  . History of hypothyroidism 10/18/2016  . Dyslipidemia 10/18/2016  . Obesity (BMI 35.0-39.9 without comorbidity) 10/18/2016  . History of GI diverticular bleed 10/18/2016  . Stage 4 chronic kidney disease ([place]) 10/18/2016  .  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 Internal and external prolapsed hemorrhoids, bleeding. 10/05/2011    Past Surgical History:  Procedure Laterality Date  . ABDOMINAL HYSTERECTOMY    . BREAST LUMPECTOMY WITH RADIOACTIVE SEED LOCALIZATION Left 08/27/2014   Procedure: BREAST LUMPECTOMY WITH RADIOACTIVE SEED LOCALIZATION;  Surgeon: Autumn Messing III, MD;  Location: [facility];  Service: General;  Laterality: Left;  . CHOLECYSTECTOMY      OB History    No data available       Home Medications    Prior to Admission medications   Medication Sig Start Date End Date Taking? Authorizing Provider  amLODipine (NORVASC) 2.5 MG tablet Take by mouth. 06/06/15   [provider]  BENICAR 40 MG tablet daily. 09/12/11   [provider]  Cholecalciferol (VITAMIN D PO) Take 1,000 mg by mouth daily.     [provider]  CRESTOR 5 MG tablet 5 mg at bedtime.  09/21/11   [provider]  diclofenac sodium (VOLTAREN) 1 % GEL Apply 3 gm to 3 large joints up to 3 times a day.Dispense 3 tubes with 3 refills. 11/20/16   Bo Merino, MD  folic acid (FOLVITE) 1 MG tablet Take 1 mg by mouth daily. 06/04/16   [provider]  Ginger, Zingiber officinalis, (GINGER EXTRACT PO) Take by mouth.    [provider]  glimepiride (AMARYL) 4 MG tablet BID times 48H. 07/06/11   [provider]  levothyroxine (SYNTHROID, LEVOTHROID) 88 MCG tablet daily. 10/01/11  [provider]  metFORMIN (GLUCOPHAGE-XR) 500 MG 24 hr tablet Take 500 mg by mouth daily with breakfast.    [provider]  methotrexate (RHEUMATREX) 2.5 MG tablet TAKE 6 TABLETS (15 MG TOTAL) BY MOUTH ONCE A WEEK. 06/02/16   [provider]  Misc Natural Products (TART CHERRY ADVANCED PO) Take by mouth.    [provider]  Multiple Minerals-Vitamins (CITRACAL PLUS PO) Take by mouth daily.    [provider]  Omega 3 1000 MG CAPS Take by  mouth.    [provider]  ONE TOUCH ULTRA TEST test strip CHECK BLOOD SUGAR DAILY. DX CODE-E11.9 08/16/16   [provider]  prednisoLONE acetate (PRED FORTE) 1 % ophthalmic suspension Place 1 drop into the right eye daily.    [provider]  Turmeric 400 MG CAPS Take by mouth.    [provider]  valACYclovir (VALTREX) 1000 MG tablet Take 1,000 mg by mouth 3 (three) times daily. 06/11/16   [provider]    Family History Family History  Problem Relation Age of Onset  . Cancer Father        lung  . Cancer Sister        breast    Social History Social History  Substance Use Topics  . Smoking status: Never Smoker  . Smokeless tobacco: Never Used  . Alcohol use No     Allergies   Shellfish allergy   Review of Systems Review of Systems  All other systems reviewed and are negative.    Physical Exam Updated Vital Signs BP 127/60   Pulse 98   Temp 98.1 F (36.7 C) (Oral)   Resp 15   SpO2 97%   Physical Exam  Constitutional: She is oriented to person, place, and time. She appears well-developed.  Elderly, robust  HENT:  Head: Normocephalic and atraumatic.  Eyes: Pupils are equal, round, and reactive to light. Conjunctivae and EOM are normal.  Neck: Normal range of motion and phonation normal. Neck supple.  Cardiovascular: Normal rate and regular rhythm.   Pulmonary/Chest: Effort normal and breath sounds normal. No respiratory distress. She exhibits no tenderness.  Abdominal: Soft. She exhibits no distension. There is no tenderness. There is no guarding.  Musculoskeletal: Normal range of motion.  Neurological: She is alert and oriented to person, place, and time. She exhibits normal muscle tone.  Skin: Skin is warm and dry.  Psychiatric: She has a normal mood and affect. Her behavior is normal. Judgment and thought content normal.  Nursing note and vitals reviewed.    ED Treatments / Results  Labs (all labs ordered  are listed, but only abnormal results are displayed) Labs Reviewed  BASIC METABOLIC PANEL - Abnormal; Notable for the following:       Result Value   Chloride 96 (*)    Glucose, Bld 220 (*)    BUN 56 (*)    Creatinine, Ser 1.14 (*)    Calcium 8.8 (*)    GFR calc non Af Amer 45 (*)    GFR calc Af Amer 52 (*)    All other components within normal limits  CBC WITH DIFFERENTIAL/PLATELET - Abnormal; Notable for the following:    RBC 3.54 (*)    Hemoglobin 11.0 (*)    HCT 33.6 (*)    All other components within normal limits  SAMPLE TO BLOOD BANK    EKG  EKG Interpretation None       Radiology No results found.  Procedures Procedures (including critical care time)  Medications Ordered in ED Medications  0.9 %  sodium chloride infusion ( Intravenous New Bag/Given 12/17/16 1559)  pantoprazole (PROTONIX) injection 40 mg (40 mg Intravenous Given 12/17/16 1559)     Initial Impression / Assessment and Plan / ED Course  I have reviewed the triage vital signs and the nursing notes.  Pertinent labs & imaging results that were available during my care of the patient were reviewed by me and considered in my medical decision making (see chart for details).      Patient Vitals for the past 24 hrs:  BP Temp Temp src Pulse Resp SpO2  12/17/16 2000 127/60 - - 98 15 97 %  12/17/16 1900 131/66 - - (!) 105 16 97 %  12/17/16 1800 116/68 - - 96 19 97 %  12/17/16 1700 114/67 - - 92 13 96 %  12/17/16 1600 118/67 - - 99 18 96 %  12/17/16 1500 127/70 98.1 F (36.7 C) Oral 99 (!) 22 95 %    8:42 PM Reevaluation with update and discussion. After initial assessment and treatment, an updated evaluation reveals she is comfortable.  She has mild burning sensation lower chest, but has not vomited since here.  Findings discussed with the patient.  She states that she has a follow-up appointment with GI, for consultation, tomorrow morning.  All questions answered. Reis Goga L      Final  Clinical Impressions(s) / ED Diagnoses   Final diagnoses:  Upper GI bleeding  Anemia, unspecified type   Upper GI bleeding with mild anemia, hemodynamically stable.  No indication for hospitalization, at this time.  Patient has short-term follow-up scheduled for tomorrow morning.  Nursing Notes Reviewed/ Care Coordinated Applicable Imaging Reviewed Interpretation of Laboratory Data incorporated into ED treatment  The patient appears reasonably screened and/or stabilized for discharge and I doubt any other medical condition or other [facility] requiring further screening, evaluation, or treatment in the ED at this time prior to discharge.  Plan: Home Medications- continue usual medication; Home Treatments- rest, fluids; return here if the recommended treatment, does not improve the symptoms; Recommended follow up- GI tomorrow, PCP prn   New Prescriptions New Prescriptions   No medications on file     Daleen Bo, MD 12/17/16 2052

## 2016-12-17 NOTE — Discharge Instructions (Signed)
Stay on a bland diet for now, and drink plenty of water.  Using an antacid such as Mylanta, before meals and at bedtime and more often if needed for burning chest pain.  Start taking the Protonix medication, tomorrow, which was prescribed earlier today

## 2016-12-17 NOTE — ED Notes (Signed)
Rn just talked to PCP. Pt has HGB of 11.1. Pt was not advised by PCP to come to ED unless she started to feel Dizzy or had more severe symptoms that N/V

## 2016-12-18 DIAGNOSIS — E669 Obesity, unspecified: Secondary | ICD-10-CM | POA: Diagnosis not present

## 2016-12-18 DIAGNOSIS — K921 Melena: Secondary | ICD-10-CM | POA: Diagnosis not present

## 2016-12-18 DIAGNOSIS — K920 Hematemesis: Secondary | ICD-10-CM | POA: Diagnosis not present

## 2016-12-18 DIAGNOSIS — R195 Other fecal abnormalities: Secondary | ICD-10-CM | POA: Diagnosis not present

## 2016-12-19 DIAGNOSIS — K259 Gastric ulcer, unspecified as acute or chronic, without hemorrhage or perforation: Secondary | ICD-10-CM | POA: Diagnosis not present

## 2016-12-19 DIAGNOSIS — K2950 Unspecified chronic gastritis without bleeding: Secondary | ICD-10-CM | POA: Diagnosis not present

## 2016-12-19 DIAGNOSIS — K920 Hematemesis: Secondary | ICD-10-CM | POA: Diagnosis not present

## 2016-12-19 DIAGNOSIS — R195 Other fecal abnormalities: Secondary | ICD-10-CM | POA: Diagnosis not present

## 2016-12-19 DIAGNOSIS — B9681 Helicobacter pylori [H. pylori] as the cause of diseases classified elsewhere: Secondary | ICD-10-CM | POA: Diagnosis not present

## 2016-12-19 DIAGNOSIS — K254 Chronic or unspecified gastric ulcer with hemorrhage: Secondary | ICD-10-CM | POA: Diagnosis not present

## 2016-12-19 DIAGNOSIS — K921 Melena: Secondary | ICD-10-CM | POA: Diagnosis not present

## 2017-01-22 ENCOUNTER — Telehealth: Payer: Self-pay | Admitting: Rheumatology

## 2017-01-22 NOTE — Telephone Encounter (Signed)
Patient has questions about Tumeric supplement. What kind should she get? Please call to advise.

## 2017-01-22 NOTE — Telephone Encounter (Signed)
Called patient back and advised patient to get Tumeric capsules from [facility], [employer] or online.

## 2017-02-05 DIAGNOSIS — E669 Obesity, unspecified: Secondary | ICD-10-CM | POA: Diagnosis not present

## 2017-02-05 DIAGNOSIS — K2970 Gastritis, unspecified, without bleeding: Secondary | ICD-10-CM | POA: Diagnosis not present

## 2017-02-05 DIAGNOSIS — K259 Gastric ulcer, unspecified as acute or chronic, without hemorrhage or perforation: Secondary | ICD-10-CM | POA: Diagnosis not present

## 2017-03-05 DIAGNOSIS — E038 Other specified hypothyroidism: Secondary | ICD-10-CM | POA: Diagnosis not present

## 2017-03-05 DIAGNOSIS — E1122 Type 2 diabetes mellitus with diabetic chronic kidney disease: Secondary | ICD-10-CM | POA: Diagnosis not present

## 2017-03-05 DIAGNOSIS — K219 Gastro-esophageal reflux disease without esophagitis: Secondary | ICD-10-CM | POA: Diagnosis not present

## 2017-03-05 DIAGNOSIS — Z6834 Body mass index (BMI) 34.0-34.9, adult: Secondary | ICD-10-CM | POA: Diagnosis not present

## 2017-03-05 DIAGNOSIS — I129 Hypertensive chronic kidney disease with stage 1 through stage 4 chronic kidney disease, or unspecified chronic kidney disease: Secondary | ICD-10-CM | POA: Diagnosis not present

## 2017-03-05 DIAGNOSIS — Z23 Encounter for immunization: Secondary | ICD-10-CM | POA: Diagnosis not present

## 2017-03-05 DIAGNOSIS — N183 Chronic kidney disease, stage 3 (moderate): Secondary | ICD-10-CM | POA: Diagnosis not present

## 2017-03-05 DIAGNOSIS — E668 Other obesity: Secondary | ICD-10-CM | POA: Diagnosis not present

## 2017-05-14 ENCOUNTER — Ambulatory Visit: Payer: BLUE CROSS/BLUE SHIELD | Admitting: Rheumatology

## 2017-05-30 NOTE — Progress Notes (Deleted)
   Office Visit Note  Patient: Tabitha Moore             Date of Birth: 10/24/1940           MRN: [id number]             PCP: Shon Baton, MD Referring: Shon Baton, MD Visit Date: 06/12/2017 Occupation: @GUAROCC @    Subjective:  No chief complaint on file.   History of Present Illness: Tabitha Moore is a 77 y.o. female ***   Activities of Daily Living:  Patient reports morning stiffness for *** {minute/hour:19697}.   Patient {ACTIONS;DENIES/REPORTS:21021675::"Denies"} nocturnal pain.  Difficulty dressing/grooming: {ACTIONS;DENIES/REPORTS:21021675::"Denies"} Difficulty climbing stairs: {ACTIONS;DENIES/REPORTS:21021675::"Denies"} Difficulty getting out of chair: {ACTIONS;DENIES/REPORTS:21021675::"Denies"} Difficulty using hands for taps, buttons, cutlery, and/or writing: {ACTIONS;DENIES/REPORTS:21021675::"Denies"}   No Rheumatology ROS completed.   PMFS History:  Patient Active Problem List   Diagnosis Date Noted  . High risk medication use 11/17/2016  . DDD (degenerative disc disease), lumbar 11/17/2016  . Primary osteoarthritis of both knees 11/13/2016  . Pain in left ankle and joints of left foot 11/13/2016  . Essential hypertension 10/18/2016  . Left ventricular hypertrophy 10/18/2016  . PVC's (premature ventricular contractions) 10/18/2016  . History of diabetes mellitus 10/18/2016  . History of hypothyroidism 10/18/2016  . Dyslipidemia 10/18/2016  . Obesity (BMI 35.0-39.9 without comorbidity) 10/18/2016  . History of GI diverticular bleed 10/18/2016  . Stage 4 chronic kidney disease ([place]) 10/18/2016  . 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 Internal and external prolapsed hemorrhoids, bleeding. 10/05/2011    Past Medical History:  Diagnosis Date  . Arthritis   . Complication of anesthesia    pt states "hard to wake up"  . Diabetes mellitus   . Hemorrhoids   . Hyperlipidemia   . Hypertension   . Hypothyroidism     Family History  Problem Relation  Age of Onset  . Cancer Father        lung  . Cancer Sister        breast   Past Surgical History:  Procedure Laterality Date  . ABDOMINAL HYSTERECTOMY    . BREAST LUMPECTOMY WITH RADIOACTIVE SEED LOCALIZATION Left 08/27/2014   Procedure: BREAST LUMPECTOMY WITH RADIOACTIVE SEED LOCALIZATION;  Surgeon: Autumn Messing III, MD;  Location: [facility];  Service: General;  Laterality: Left;  . CHOLECYSTECTOMY     Social History   Social History Narrative  . Not on file     Objective: Vital Signs: There were no vitals taken for this visit.   Physical Exam   Musculoskeletal Exam: ***  CDAI Exam: No CDAI exam completed.    Investigation: No additional findings.   Imaging: No results found.  Speciality Comments: No specialty comments available.    Procedures:  No procedures performed Allergies: Shellfish allergy   Assessment / Plan:     Visit Diagnoses: No diagnosis found.    Orders: No orders of the defined types were placed in this encounter.  No orders of the defined types were placed in this encounter.   Face-to-face time spent with patient was *** minutes. 50% of time was spent in counseling and coordination of care.  Follow-Up Instructions: No Follow-up on file.   Earnestine Mealing, CMA  Note - This record has been created using Editor, commissioning.  Chart creation errors have been sought, but may not always  have been located. Such creation errors do not reflect on  the standard of medical care.

## 2017-06-12 ENCOUNTER — Ambulatory Visit: Payer: BLUE CROSS/BLUE SHIELD | Admitting: Rheumatology

## 2017-06-12 DIAGNOSIS — R82998 Other abnormal findings in urine: Secondary | ICD-10-CM | POA: Diagnosis not present

## 2017-06-12 DIAGNOSIS — I10 Essential (primary) hypertension: Secondary | ICD-10-CM | POA: Diagnosis not present

## 2017-06-12 DIAGNOSIS — E7849 Other hyperlipidemia: Secondary | ICD-10-CM | POA: Diagnosis not present

## 2017-06-12 DIAGNOSIS — E1122 Type 2 diabetes mellitus with diabetic chronic kidney disease: Secondary | ICD-10-CM | POA: Diagnosis not present

## 2017-06-12 DIAGNOSIS — E038 Other specified hypothyroidism: Secondary | ICD-10-CM | POA: Diagnosis not present

## 2017-06-17 DIAGNOSIS — E1122 Type 2 diabetes mellitus with diabetic chronic kidney disease: Secondary | ICD-10-CM | POA: Diagnosis not present

## 2017-06-17 DIAGNOSIS — I129 Hypertensive chronic kidney disease with stage 1 through stage 4 chronic kidney disease, or unspecified chronic kidney disease: Secondary | ICD-10-CM | POA: Diagnosis not present

## 2017-06-17 DIAGNOSIS — Z1389 Encounter for screening for other disorder: Secondary | ICD-10-CM | POA: Diagnosis not present

## 2017-06-17 DIAGNOSIS — Z6836 Body mass index (BMI) 36.0-36.9, adult: Secondary | ICD-10-CM | POA: Diagnosis not present

## 2017-06-17 DIAGNOSIS — Z0000 Encounter for general adult medical examination without abnormal findings: Secondary | ICD-10-CM | POA: Diagnosis not present

## 2017-06-17 DIAGNOSIS — N183 Chronic kidney disease, stage 3 (moderate): Secondary | ICD-10-CM | POA: Diagnosis not present

## 2017-06-17 DIAGNOSIS — K219 Gastro-esophageal reflux disease without esophagitis: Secondary | ICD-10-CM | POA: Diagnosis not present

## 2017-06-17 DIAGNOSIS — E038 Other specified hypothyroidism: Secondary | ICD-10-CM | POA: Diagnosis not present

## 2017-06-17 DIAGNOSIS — D899 Disorder involving the immune mechanism, unspecified: Secondary | ICD-10-CM | POA: Diagnosis not present

## 2017-06-17 DIAGNOSIS — E7849 Other hyperlipidemia: Secondary | ICD-10-CM | POA: Diagnosis not present

## 2017-06-17 DIAGNOSIS — I493 Ventricular premature depolarization: Secondary | ICD-10-CM | POA: Diagnosis not present

## 2017-06-17 DIAGNOSIS — I517 Cardiomegaly: Secondary | ICD-10-CM | POA: Diagnosis not present

## 2017-06-18 DIAGNOSIS — H30033 Focal chorioretinal inflammation, peripheral, bilateral: Secondary | ICD-10-CM | POA: Diagnosis not present

## 2017-06-18 DIAGNOSIS — H3581 Retinal edema: Secondary | ICD-10-CM | POA: Diagnosis not present

## 2017-06-18 DIAGNOSIS — H26492 Other secondary cataract, left eye: Secondary | ICD-10-CM | POA: Diagnosis not present

## 2017-06-18 DIAGNOSIS — E113313 Type 2 diabetes mellitus with moderate nonproliferative diabetic retinopathy with macular edema, bilateral: Secondary | ICD-10-CM | POA: Diagnosis not present

## 2017-06-18 DIAGNOSIS — Z1212 Encounter for screening for malignant neoplasm of rectum: Secondary | ICD-10-CM | POA: Diagnosis not present

## 2017-06-18 DIAGNOSIS — Z79899 Other long term (current) drug therapy: Secondary | ICD-10-CM | POA: Diagnosis not present

## 2017-06-18 DIAGNOSIS — Z961 Presence of intraocular lens: Secondary | ICD-10-CM | POA: Diagnosis not present

## 2017-06-18 DIAGNOSIS — Z794 Long term (current) use of insulin: Secondary | ICD-10-CM | POA: Diagnosis not present

## 2017-06-20 DIAGNOSIS — H30033 Focal chorioretinal inflammation, peripheral, bilateral: Secondary | ICD-10-CM | POA: Diagnosis not present

## 2017-06-20 DIAGNOSIS — Z79899 Other long term (current) drug therapy: Secondary | ICD-10-CM | POA: Diagnosis not present

## 2017-07-01 NOTE — Progress Notes (Signed)
Office Visit Note  Patient: Tabitha Moore             Date of Birth: 05-25-1940           MRN: [id number]             PCP: Shon Baton, MD Referring: Shon Baton, MD Visit Date: 07/12/2017 Occupation: _0 @    Subjective:  Bilateral knee pain    History of Present Illness: Tabitha Moore is a 77 y.o. female with history of chorioretinitis, osteoarthritis and degenerative disc disease of the lumbar spine.  Patient states that she saw Dr. Manuella Ghazi in February 2019 and she was taken off methotrexate and folic acid.  She denies any recent flares.  She will continue to follow up with him on a regular basis. She states that she has pain in multiple joints with weather changes.  She states that she is having bilateral knee pain.  She states that she has not noticed any swelling.  She states that she takes natural anti-inflammatories on a daily basis which is been helping with her inflammation.  She states that she seems to be having more joint stiffness.  She states that when she is experiencing pain in her joints she takes Tylenol for pain relief.  She states that her lower back hurts her intermittently especially she standing for prolonged periods of time. She was started on omeprazole daily for peptic ulcer disease.   Activities of Daily Living:  Patient reports morning stiffness for 1 hour.   Patient Reports nocturnal pain.  Difficulty dressing/grooming: Denies Difficulty climbing stairs: Reports Difficulty getting out of chair: Reports Difficulty using hands for taps, buttons, cutlery, and/or writing: Denies   Review of Systems  Constitutional: Positive for fatigue.  HENT: Negative for mouth sores, mouth dryness and nose dryness.   Eyes: Negative for pain, visual disturbance and dryness.  Respiratory: Negative for cough, hemoptysis, shortness of breath and difficulty breathing.   Cardiovascular: Negative for chest pain, palpitations, hypertension and swelling in legs/feet.    Gastrointestinal: Negative for blood in stool, constipation and diarrhea.  Endocrine: Negative for increased urination.  Genitourinary: Negative for painful urination.  Musculoskeletal: Positive for arthralgias, joint pain and morning stiffness. Negative for joint swelling, myalgias, muscle weakness, muscle tenderness and myalgias.  Skin: Negative for color change, pallor, rash, hair loss, nodules/bumps, skin tightness, ulcers and sensitivity to sunlight.  Allergic/Immunologic: Negative for susceptible to infections.  Neurological: Negative for dizziness, numbness, headaches and weakness.  Hematological: Negative for swollen glands.  Psychiatric/Behavioral: Negative for depressed mood and sleep disturbance. The patient is not nervous/anxious.     PMFS History:  Patient Active Problem List   Diagnosis Date Noted  . High risk medication use 11/17/2016  . DDD (degenerative disc disease), lumbar 11/17/2016  . Primary osteoarthritis of both knees 11/13/2016  . Pain in left ankle and joints of left foot 11/13/2016  . Essential hypertension 10/18/2016  . Left ventricular hypertrophy 10/18/2016  . PVC's (premature ventricular contractions) 10/18/2016  . History of diabetes mellitus 10/18/2016  . History of hypothyroidism 10/18/2016  . Dyslipidemia 10/18/2016  . Obesity (BMI 35.0-39.9 without comorbidity) 10/18/2016  . History of GI diverticular bleed 10/18/2016  . Stage 4 chronic kidney disease ([place]) 10/18/2016  . 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 Internal and external prolapsed hemorrhoids, bleeding. 10/05/2011    Past Medical History:  Diagnosis Date  . Arthritis   . Complication of anesthesia    pt states "hard to wake up"  .  Diabetes mellitus   . Hemorrhoids   . Hyperlipidemia   . Hypertension   . Hypothyroidism     Family History  Problem Relation Age of Onset  . Cancer Father        lung  . Cancer Sister        breast   Past Surgical History:  Procedure  Laterality Date  . ABDOMINAL HYSTERECTOMY    . BREAST LUMPECTOMY WITH RADIOACTIVE SEED LOCALIZATION Left 08/27/2014   Procedure: BREAST LUMPECTOMY WITH RADIOACTIVE SEED LOCALIZATION;  Surgeon: Autumn Messing III, MD;  Location: [facility];  Service: General;  Laterality: Left;  . CHOLECYSTECTOMY     Social History   Social History Narrative  . Not on file     Objective: Vital Signs: BP (!) 150/84 (BP Location: Left Arm, Patient Position: Sitting, Cuff Size: Normal)   Pulse 85   Resp 16   Ht _0  (1.6 m)   Wt 196 lb (88.9 kg)   BMI 34.72 kg/m    Physical Exam  Constitutional: She is oriented to person, place, and time. She appears well-developed and well-nourished.  HENT:  Head: Normocephalic and atraumatic.  Eyes: Conjunctivae and EOM are normal.  Neck: Normal range of motion.  Cardiovascular: Normal rate, regular rhythm, normal heart sounds and intact distal pulses.  Pulmonary/Chest: Effort normal and breath sounds normal.  Abdominal: Soft. Bowel sounds are normal.  Lymphadenopathy:    She has no cervical adenopathy.  Neurological: She is alert and oriented to person, place, and time.  Skin: Skin is warm and dry. Capillary refill takes less than 2 seconds.  Psychiatric: She has a normal mood and affect. Her behavior is normal.  Nursing note and vitals reviewed.    Musculoskeletal Exam: C-spine good range of motion.  She has some limited range of motion of her thoracic and lumbar spine.  No midline spinal tenderness.  No SI joint tenderness.  Shoulder joints, elbow joints, wrist joints, MCPs, PIPs, DIPs good range of motion with no synovitis.  She has mild osteoarthritic changes in her bilateral hands.  Hip joints, knee joints, ankle joints, MTPs, PIPs, DIPs good range of motion with no synovitis.  She has osteoarthritic changes in her bilateral feet. Bilateral knee crepitus.  No warmth or effusion of knees.  No tenderness of trochanteric bursa bilaterally.  CDAI  Exam: No CDAI exam completed.    Investigation: No additional findings.   Imaging: No results found.  Speciality Comments: No specialty comments available.    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  She has no clinical features of vasculitis at this time.  She denies any recent flares.  She continues to see Dr. Manuella Ghazi on a regular basis.  At her last visit in February 2019 she was taken off of methotrexate and folic acid.  She was advised to notify us if she is restarted on methotrexate.  She will follow-up with [place] in 1 year.  High risk medication use -she is no longer taking methotrexate and folic acid.  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  She has bilateral knee crepitus.  No warmth or effusion was noted on exam.  She is no longer using Voltaren gel.  DDD (degenerative disc disease), lumbar: She has intermittent discomfort in her  lower back.  She has no midline spinal tenderness on exam today.  Other medical conditions are listed as follows:  History of diabetes mellitus  History of hypothyroidism  History of GI diverticular bleed  History of obesity  History of hypertension  Dyslipidemia  Left ventricular hypertrophy  Stage 4 chronic kidney disease ([place])    Orders: No orders of the defined types were placed in this encounter.  No orders of the defined types were placed in this encounter.    Follow-Up Instructions: Return in about 1 year (around 07/13/2018) for Osteoarthritis, Chorioretinitis.   Ofilia Neas, PA-C   I examined and evaluated the patient with Hazel Sams PA. The plan of care was discussed as noted above.  Bo Merino, MD Note - This record has been created using Editor, commissioning.  Chart  creation errors have been sought, but may not always  have been located. Such creation errors do not reflect on  the standard of medical care.

## 2017-07-12 ENCOUNTER — Encounter: Payer: Self-pay | Admitting: Rheumatology

## 2017-07-12 ENCOUNTER — Ambulatory Visit (INDEPENDENT_AMBULATORY_CARE_PROVIDER_SITE_OTHER): Payer: Medicare Other | Admitting: Rheumatology

## 2017-07-12 VITALS — BP 150/84 | HR 85 | Resp 16 | Ht 63.0 in | Wt 196.0 lb

## 2017-07-12 DIAGNOSIS — N184 Chronic kidney disease, stage 4 (severe): Secondary | ICD-10-CM | POA: Diagnosis not present

## 2017-07-12 DIAGNOSIS — H3093 Unspecified chorioretinal inflammation, bilateral: Secondary | ICD-10-CM | POA: Diagnosis not present

## 2017-07-12 DIAGNOSIS — Z8719 Personal history of other diseases of the digestive system: Secondary | ICD-10-CM | POA: Diagnosis not present

## 2017-07-12 DIAGNOSIS — Z8679 Personal history of other diseases of the circulatory system: Secondary | ICD-10-CM | POA: Diagnosis not present

## 2017-07-12 DIAGNOSIS — Z79899 Other long term (current) drug therapy: Secondary | ICD-10-CM | POA: Diagnosis not present

## 2017-07-12 DIAGNOSIS — M5136 Other intervertebral disc degeneration, lumbar region: Secondary | ICD-10-CM | POA: Diagnosis not present

## 2017-07-12 DIAGNOSIS — Z8639 Personal history of other endocrine, nutritional and metabolic disease: Secondary | ICD-10-CM

## 2017-07-12 DIAGNOSIS — E785 Hyperlipidemia, unspecified: Secondary | ICD-10-CM | POA: Diagnosis not present

## 2017-07-12 DIAGNOSIS — M170 Bilateral primary osteoarthritis of knee: Secondary | ICD-10-CM

## 2017-07-12 DIAGNOSIS — I517 Cardiomegaly: Secondary | ICD-10-CM | POA: Diagnosis not present

## 2017-09-09 ENCOUNTER — Other Ambulatory Visit: Payer: Self-pay | Admitting: Internal Medicine

## 2017-09-09 DIAGNOSIS — Z1231 Encounter for screening mammogram for malignant neoplasm of breast: Secondary | ICD-10-CM

## 2017-09-16 DIAGNOSIS — Z6834 Body mass index (BMI) 34.0-34.9, adult: Secondary | ICD-10-CM | POA: Diagnosis not present

## 2017-09-16 DIAGNOSIS — R600 Localized edema: Secondary | ICD-10-CM | POA: Diagnosis not present

## 2017-09-16 DIAGNOSIS — I10 Essential (primary) hypertension: Secondary | ICD-10-CM | POA: Diagnosis not present

## 2017-10-18 NOTE — Progress Notes (Signed)
Office Visit Note  Patient: Tabitha Moore             Date of Birth: 05-24-1940           MRN: [id number]             PCP: Shon Baton, MD Referring: Shon Baton, MD Visit Date: 10/23/2017 Occupation: '@GUAROCC' @  Subjective:  Right foot pain   History of Present Illness: Tabitha Moore is a 77 y.o. female with history of chorioretinitis of both eyes, osteoarthritis, and DDD. She is no longer on MTX.  She reports she was seen by Dr. Manuella Ghazi yesterday, and she reports her eye had inflammation and he will be starting her on a new po medication.  She is unsure of the name of the medication.   She presents today with right ankle and foot swelling that started 2 months ago.  She states the right ankle joint feels stiff and uncomfortable at time.  She denies any true pain.  She states the swelling is worse after walking long distances.  She has been icing and elevating her right ankle.  She states she stopped taking amlodipine due to wondering if this is causing some of the swelling.  She reports that she wears proper fitting shoes with memory foam but is wondering if it is causing some issues as well.  She continues to have chronic lower back pain but denies any radiation of pain at this time.  She denies any other joint pain or joint swelling at this time.   Activities of Daily Living:  Patient reports morning stiffness for 10-25  minutes.   Patient Denies nocturnal pain.  Difficulty dressing/grooming: Denies Difficulty climbing stairs: Reports Difficulty getting out of chair: Reports Difficulty using hands for taps, buttons, cutlery, and/or writing: Denies  Review of Systems  Constitutional: Positive for fatigue.  HENT: Negative for mouth sores, mouth dryness and nose dryness.   Eyes: Positive for dryness. Negative for pain and visual disturbance.  Respiratory: Negative for cough, hemoptysis, shortness of breath and difficulty breathing.   Cardiovascular: Negative for chest pain,  palpitations, hypertension and swelling in legs/feet.  Gastrointestinal: Positive for constipation. Negative for blood in stool and diarrhea.  Endocrine: Negative for increased urination.  Genitourinary: Negative for painful urination.  Musculoskeletal: Positive for arthralgias, joint pain, joint swelling and morning stiffness. Negative for myalgias, muscle weakness, muscle tenderness and myalgias.  Skin: Negative for color change, pallor, rash, hair loss, nodules/bumps, skin tightness, ulcers and sensitivity to sunlight.  Allergic/Immunologic: Negative for susceptible to infections.  Neurological: Negative for dizziness, numbness, headaches and weakness.  Hematological: Negative for swollen glands.  Psychiatric/Behavioral: Negative for depressed mood and sleep disturbance. The patient is not nervous/anxious.     PMFS History:  Patient Active Problem List   Diagnosis Date Noted  . High risk medication use 11/17/2016  . DDD (degenerative disc disease), lumbar 11/17/2016  . Primary osteoarthritis of both knees 11/13/2016  . Pain in left ankle and joints of left foot 11/13/2016  . Essential hypertension 10/18/2016  . Left ventricular hypertrophy 10/18/2016  . PVC's (premature ventricular contractions) 10/18/2016  . History of diabetes mellitus 10/18/2016  . History of hypothyroidism 10/18/2016  . Dyslipidemia 10/18/2016  . Obesity (BMI 35.0-39.9 without comorbidity) 10/18/2016  . History of GI diverticular bleed 10/18/2016  . Stage 4 chronic kidney disease ([place]) 10/18/2016  . 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 Internal and external prolapsed hemorrhoids, bleeding. 10/05/2011    Past Medical History:  Diagnosis Date  . Arthritis   . Complication of anesthesia    pt states "hard to wake up"  . Diabetes mellitus   . Hemorrhoids   . Hyperlipidemia   . Hypertension   . Hypothyroidism     Family History  Problem Relation Age of Onset  . Cancer Father        lung  . Cancer  Sister        breast   Past Surgical History:  Procedure Laterality Date  . ABDOMINAL HYSTERECTOMY    . BREAST LUMPECTOMY WITH RADIOACTIVE SEED LOCALIZATION Left 08/27/2014   Procedure: BREAST LUMPECTOMY WITH RADIOACTIVE SEED LOCALIZATION;  Surgeon: Autumn Messing III, MD;  Location: [facility];  Service: General;  Laterality: Left;  . CHOLECYSTECTOMY     Social History   Social History Narrative  . Not on file    Objective: Vital Signs: BP (!) 168/86 (BP Location: Left Arm, Patient Position: Sitting, Cuff Size: Normal)   Pulse 100   Resp 14   Ht '5\' 3"'  (1.6 m)   Wt 200 lb (90.7 kg)   BMI 35.43 kg/m    Physical Exam  Constitutional: She is oriented to person, place, and time. She appears well-developed and well-nourished.  HENT:  Head: Normocephalic and atraumatic.  Eyes: Conjunctivae and EOM are normal.  Neck: Normal range of motion.  Cardiovascular: Normal rate, regular rhythm, normal heart sounds and intact distal pulses.  Pulmonary/Chest: Effort normal and breath sounds normal.  Abdominal: Soft. Bowel sounds are normal.  Lymphadenopathy:    She has no cervical adenopathy.  Neurological: She is alert and oriented to person, place, and time.  Skin: Skin is warm and dry. Capillary refill takes less than 2 seconds.  Psychiatric: She has a normal mood and affect. Her behavior is normal.  Nursing note and vitals reviewed.    Musculoskeletal Exam: C-spine, thoracic spine, lumbar spine good range of motion.  No midline spinal tenderness.  No SI joint tenderness.  Shoulder joints, elbow joints, wrist joints, MCPs, PIPs, DIPs good range of motion no synovitis.  Complete fist formation bilaterally.  Hip joints, knee joints, ankle joints, MTPs, PIPs, DIPs good range of motion no synovitis.  She has PIP and DIP synovial thickening consistent with osteoarthritis of bilateral feet.  She has some diffuse swelling around the right ankle joint but no warmth was noted.  No warmth  or effusion of bilateral knee joints.  No tenderness of trochanter bursa bilaterally.  CDAI Exam: No CDAI exam completed.   Investigation: No additional findings.  Imaging: Xr Ankle Complete Right  Result Date: 10/23/2017 No tibiotalar or subtalar joint space narrowing was noted.  No fracture was noted.  No calcinosis was noted.   Recent Labs: Lab Results  Component Value Date   WBC 10.0 12/17/2016   HGB 11.0 (L) 12/17/2016   PLT 308 12/17/2016   NA 135 12/17/2016   K 3.8 12/17/2016   CL 96 (L) 12/17/2016   CO2 29 12/17/2016   GLUCOSE 220 (H) 12/17/2016   BUN 56 (H) 12/17/2016   CREATININE 1.14 (H) 12/17/2016   CALCIUM 8.8 (L) 12/17/2016   GFRAA 52 (L) 12/17/2016    Speciality Comments: No specialty comments available.  Procedures:  No procedures performed Allergies: Shellfish allergy   Assessment / Plan:     Visit Diagnoses: Chorioretinitis of both eyes - ESR 48, P-ANCA1:20, all other autoimmune work up is negative: She reports that she saw Dr. Manuella Ghazi yesterday for evaluation.  Per  patient she had right eye inflammation and will be started on a new medication by mouth.  She is unsure the name of the medication.  She is going to call his office and have his note faxed to [place].  High risk medication use - She is no longer taking methotrexate and folic acid.  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  She is good range of motion with no discomfort.  Pain in right ankle and joints of right foot -She reports right ankle swelling and discomfort for the past 2 months.  She had diffuse right ankle swelling but no warmth or erythema. She has no tenderness along the ankle joint line.  XR of the right ankle was obtained today, which was unremarkable.  We obtain the following labs today including uric acid, RF, CCP, and sed rate.  She was given a handout of ankle exercises that she can perform at home.  Plan: Uric  acid, Rheumatoid factor, Cyclic citrul peptide antibody, IgG, Sedimentation rate, XR Ankle Complete Right   DDD (degenerative disc disease), lumbar: She had no midline spinal tenderness on exam.  She has good range of motion with no discomfort.  Other medical conditions are listed as follows:  Stage 4 chronic kidney disease ([place])  Left ventricular hypertrophy  History of diabetes mellitus  History of GI diverticular bleed  History of obesity  History of hypertension  History of hypothyroidism  Dyslipidemia   Orders: Orders Placed This Encounter  Procedures  . XR Ankle Complete Right  . Uric acid  . Rheumatoid factor  . Cyclic citrul peptide antibody, IgG  . Sedimentation rate   No orders of the defined types were placed in this encounter.   Face-to-face time spent with patient was 30 minutes. Greater than 50% of time was spent in counseling and coordination of care.  Follow-Up Instructions: Return in about 6 months (around 04/25/2018) for Chorioretinitis of both eyes, osteoarthritis, and DDD.   Tabitha Neas, PA-C   I examined and evaluated the patient with Tabitha Sams PA.  Some swelling on her right ankle today.  She also has some pitting edema.  We will obtain labs as mentioned above.  The plan of care was discussed as noted above.  Bo Merino, MD  Note - This record has been created using Editor, commissioning.  Chart creation errors have been sought, but may not always  have been located. Such creation errors do not reflect on  the standard of medical care.

## 2017-10-22 DIAGNOSIS — H30033 Focal chorioretinal inflammation, peripheral, bilateral: Secondary | ICD-10-CM | POA: Diagnosis not present

## 2017-10-22 DIAGNOSIS — H26492 Other secondary cataract, left eye: Secondary | ICD-10-CM | POA: Diagnosis not present

## 2017-10-22 DIAGNOSIS — Z794 Long term (current) use of insulin: Secondary | ICD-10-CM | POA: Diagnosis not present

## 2017-10-22 DIAGNOSIS — Z961 Presence of intraocular lens: Secondary | ICD-10-CM | POA: Diagnosis not present

## 2017-10-22 DIAGNOSIS — Z79899 Other long term (current) drug therapy: Secondary | ICD-10-CM | POA: Diagnosis not present

## 2017-10-22 DIAGNOSIS — E113313 Type 2 diabetes mellitus with moderate nonproliferative diabetic retinopathy with macular edema, bilateral: Secondary | ICD-10-CM | POA: Diagnosis not present

## 2017-10-22 DIAGNOSIS — H3581 Retinal edema: Secondary | ICD-10-CM | POA: Diagnosis not present

## 2017-10-23 ENCOUNTER — Ambulatory Visit (INDEPENDENT_AMBULATORY_CARE_PROVIDER_SITE_OTHER): Payer: Self-pay

## 2017-10-23 ENCOUNTER — Encounter: Payer: Self-pay | Admitting: Rheumatology

## 2017-10-23 ENCOUNTER — Ambulatory Visit (INDEPENDENT_AMBULATORY_CARE_PROVIDER_SITE_OTHER): Payer: Medicare Other | Admitting: Rheumatology

## 2017-10-23 VITALS — BP 168/86 | HR 100 | Resp 14 | Ht 63.0 in | Wt 200.0 lb

## 2017-10-23 DIAGNOSIS — M170 Bilateral primary osteoarthritis of knee: Secondary | ICD-10-CM

## 2017-10-23 DIAGNOSIS — H3093 Unspecified chorioretinal inflammation, bilateral: Secondary | ICD-10-CM

## 2017-10-23 DIAGNOSIS — Z8719 Personal history of other diseases of the digestive system: Secondary | ICD-10-CM | POA: Diagnosis not present

## 2017-10-23 DIAGNOSIS — E785 Hyperlipidemia, unspecified: Secondary | ICD-10-CM

## 2017-10-23 DIAGNOSIS — N184 Chronic kidney disease, stage 4 (severe): Secondary | ICD-10-CM | POA: Diagnosis not present

## 2017-10-23 DIAGNOSIS — Z8639 Personal history of other endocrine, nutritional and metabolic disease: Secondary | ICD-10-CM

## 2017-10-23 DIAGNOSIS — M25571 Pain in right ankle and joints of right foot: Secondary | ICD-10-CM

## 2017-10-23 DIAGNOSIS — M51369 Other intervertebral disc degeneration, lumbar region without mention of lumbar back pain or lower extremity pain: Secondary | ICD-10-CM

## 2017-10-23 DIAGNOSIS — Z79899 Other long term (current) drug therapy: Secondary | ICD-10-CM | POA: Diagnosis not present

## 2017-10-23 DIAGNOSIS — I517 Cardiomegaly: Secondary | ICD-10-CM | POA: Diagnosis not present

## 2017-10-23 DIAGNOSIS — M5136 Other intervertebral disc degeneration, lumbar region: Secondary | ICD-10-CM | POA: Diagnosis not present

## 2017-10-23 DIAGNOSIS — Z8679 Personal history of other diseases of the circulatory system: Secondary | ICD-10-CM | POA: Diagnosis not present

## 2017-10-23 NOTE — Patient Instructions (Signed)
Ankle Exercises Ask your health care provider which exercises are safe for you. Do exercises exactly as told by your health care provider and adjust them as directed. It is normal to feel mild stretching, pulling, tightness, or discomfort as you do these exercises, but you should stop right away if you feel sudden pain or your pain gets worse. Do not begin these exercises until told by your health care provider. Stretching and range of motion exercises These exercises warm up your muscles and joints and improve the movement and flexibility of your ankle. These exercises also help to relieve pain, numbness, and tingling. Exercise A: Dorsiflexion/Plantar Flexion  1. Sit with your __________ knee straight or bent. Do not rest your foot on anything. 2. Flex your __________ ankle to tilt the top of your foot toward your shin. 3. Hold this position for __________ seconds. 4. Point your toes downward to tilt the top of your foot away from your shin. 5. Hold this position for __________ seconds. Repeat __________ times. Complete this exercise __________ times a day. Exercise B: Ankle Alphabet  1. Sit with your __________ foot supported at your lower leg. ? Do not rest your foot on anything. ? Make sure your foot has room to move freely. 2. Think of your __________ foot as a paintbrush, and move your foot to trace each letter of the alphabet in the air. Keep your hip and knee still while you trace. Make the letters as large as you can without increasing any discomfort. 3. Trace every letter from A to Z. Repeat __________ times. Complete this exercise __________ times a day. Exercise C: Ankle Dorsiflexion, Passive 1. Sit on a chair that is placed on a non-carpeted surface. 2. Place your __________ foot on the floor, directly under your __________ knee. Extend your __________ leg for support. 3. Keeping your heel down, slide your __________ foot back toward the chair until you feel a stretch at your  ankle or calf. If you do not feel a stretch, slide your buttocks forward to the edge of the chair. 4. Hold this stretch for __________ seconds. Repeat __________ times. Complete this stretch __________ times a day. Strengthening exercises These exercises build strength and endurance in your ankle. Endurance is the ability to use your muscles for a long time, even after they get tired. Exercise D: Dorsiflexors  1. Secure a rubber exercise band or tube to an object, such as a table leg, that will stay still when the band is pulled. Secure the other end around your __________ foot. 2. Sit on the floor, facing the object with your __________ leg extended. The band or tube should be slightly tense when your foot is relaxed. 3. Slowly flex your __________ ankle and toes to bring your foot toward you. 4. Hold this position for __________ seconds. 5. Slowly return your foot to the starting position, controlling the band as you do that. Repeat __________ times. Complete this exercise __________ times a day. Exercise E: Plantar Flexors  1. Sit on the floor with your __________ leg extended. 2. Loop a rubber exercise band or tube around the ball of your __________ foot. The ball of your foot is on the walking surface, right under your toes. The band or tube should be slightly tense when your foot is relaxed. 3. Slowly point your toes downward, pushing them away from you. 4. Hold this position for __________ seconds. 5. Slowly release the tension in the band or tube, controlling smoothly until your foot is  back in the starting position. Repeat __________ times. Complete this exercise __________ times a day. Exercise F: Towel Curls  1. Sit in a chair on a non-carpeted surface, and put your feet on the floor. 2. Place a towel in front of your feet. If told by your health care provider, add __________ to the end of the towel. 3. Keeping your heel on the floor, put your __________ foot on the  towel. 4. Pull the towel toward you by grabbing the towel with your toes and curling them under. Keep your heel on the floor. 5. Let your toes relax. 6. Grab the towel again. Keep going until the towel is completely underneath your foot. Repeat __________ times. Complete this exercise __________ times a day. Exercise G: Heel Raise ( Plantar Flexors, Standing) 1. Stand with your feet shoulder-width apart. 2. Keep your weight spread evenly over the width of your feet while you rise up on your toes. Use a wall or table to steady yourself, but try not to use it for support. 3. If this exercise is too easy, try these options: ? Shift your weight toward your __________ leg until you feel challenged. ? If told by your health care provider, lift your uninjured leg off the floor. 4. Hold this position for __________ seconds. Repeat __________ times. Complete this exercise __________ times a day. Exercise H: Tandem Walking 1. Stand with one foot directly in front of the other. 2. Slowly raise your back foot up, lifting your heel before your toes, and place it directly in front of your other foot. 3. Continue to walk in this heel-to-toe way for __________ or for as long as told by your health care provider. Have a countertop or wall nearby to use if needed to keep your balance, but try not to hold onto anything for support. Repeat __________ times. Complete this exercises __________ times a day. This information is not intended to replace advice given to you by your health care provider. Make sure you discuss any questions you have with your health care provider. Document Released: 02/07/2005 Document Revised: 11/24/2015 Document Reviewed: 12/12/2014 Elsevier Interactive Patient Education  2018 [employer]  

## 2017-10-24 LAB — URIC ACID: Uric Acid, Serum: 5.3 mg/dL (ref 2.5–7.0)

## 2017-10-24 LAB — RHEUMATOID FACTOR: Rhuematoid fact SerPl-aCnc: <14 IU/mL (ref <14)

## 2017-10-24 LAB — SEDIMENTATION RATE: Sed Rate: 22 mm/h (ref 0–30)

## 2017-10-24 LAB — CYCLIC CITRUL PEPTIDE ANTIBODY, IGG: Cyclic Citrullin Peptide Ab: <16 UNITS

## 2017-10-24 NOTE — Progress Notes (Signed)
Please notify patient that all the autoimmune labs are WNL.  Dr. Estanislado Pandy would like to schedule the pain for a MRI of the right ankle.

## 2017-10-28 ENCOUNTER — Telehealth: Payer: Self-pay | Admitting: *Deleted

## 2017-10-28 DIAGNOSIS — M25571 Pain in right ankle and joints of right foot: Secondary | ICD-10-CM

## 2017-10-28 NOTE — Telephone Encounter (Signed)
-----   Message from Ofilia Neas, PA-C sent at 10/24/2017 12:52 PM EDT ----- Please notify patient that all the autoimmune labs are WNL.  Dr. Estanislado Pandy would like to schedule the pain for a MRI of the right ankle.

## 2017-11-12 ENCOUNTER — Ambulatory Visit (HOSPITAL_COMMUNITY)
Admission: RE | Admit: 2017-11-12 | Discharge: 2017-11-12 | Disposition: A | Discharge status: Home / Self Care | Payer: Medicare Other | Source: Ambulatory Visit | Attending: Rheumatology | Admitting: Rheumatology

## 2017-11-12 DIAGNOSIS — M25571 Pain in right ankle and joints of right foot: Secondary | ICD-10-CM

## 2017-11-12 DIAGNOSIS — M67871 Other specified disorders of synovium, right ankle and foot: Secondary | ICD-10-CM | POA: Diagnosis not present

## 2017-11-12 NOTE — Progress Notes (Signed)
There was no option to leave message on the answering machine.  Please try to reach patient again.I could not reach the patient.  MRI is consistent with a tendon sprain.  She may benefit from ankle brace.  If her symptoms persist she may see a foot specialist.

## 2017-11-22 DIAGNOSIS — E113313 Type 2 diabetes mellitus with moderate nonproliferative diabetic retinopathy with macular edema, bilateral: Secondary | ICD-10-CM | POA: Diagnosis not present

## 2017-11-22 DIAGNOSIS — Z79899 Other long term (current) drug therapy: Secondary | ICD-10-CM | POA: Diagnosis not present

## 2017-11-22 DIAGNOSIS — H26492 Other secondary cataract, left eye: Secondary | ICD-10-CM | POA: Diagnosis not present

## 2017-11-22 DIAGNOSIS — H3581 Retinal edema: Secondary | ICD-10-CM | POA: Diagnosis not present

## 2017-11-22 DIAGNOSIS — Z794 Long term (current) use of insulin: Secondary | ICD-10-CM | POA: Diagnosis not present

## 2017-11-22 DIAGNOSIS — Z961 Presence of intraocular lens: Secondary | ICD-10-CM | POA: Diagnosis not present

## 2017-11-22 DIAGNOSIS — H30033 Focal chorioretinal inflammation, peripheral, bilateral: Secondary | ICD-10-CM | POA: Diagnosis not present

## 2017-11-27 DIAGNOSIS — Z23 Encounter for immunization: Secondary | ICD-10-CM | POA: Diagnosis not present

## 2017-12-02 DIAGNOSIS — E668 Other obesity: Secondary | ICD-10-CM | POA: Diagnosis not present

## 2017-12-02 DIAGNOSIS — E1122 Type 2 diabetes mellitus with diabetic chronic kidney disease: Secondary | ICD-10-CM | POA: Diagnosis not present

## 2017-12-02 DIAGNOSIS — E11319 Type 2 diabetes mellitus with unspecified diabetic retinopathy without macular edema: Secondary | ICD-10-CM | POA: Diagnosis not present

## 2017-12-02 DIAGNOSIS — E038 Other specified hypothyroidism: Secondary | ICD-10-CM | POA: Diagnosis not present

## 2017-12-02 DIAGNOSIS — Z6835 Body mass index (BMI) 35.0-35.9, adult: Secondary | ICD-10-CM | POA: Diagnosis not present

## 2017-12-02 DIAGNOSIS — I129 Hypertensive chronic kidney disease with stage 1 through stage 4 chronic kidney disease, or unspecified chronic kidney disease: Secondary | ICD-10-CM | POA: Diagnosis not present

## 2017-12-02 DIAGNOSIS — D6489 Other specified anemias: Secondary | ICD-10-CM | POA: Diagnosis not present

## 2017-12-02 DIAGNOSIS — H209 Unspecified iridocyclitis: Secondary | ICD-10-CM | POA: Diagnosis not present

## 2017-12-02 DIAGNOSIS — R600 Localized edema: Secondary | ICD-10-CM | POA: Diagnosis not present

## 2018-01-24 DIAGNOSIS — Z961 Presence of intraocular lens: Secondary | ICD-10-CM | POA: Diagnosis not present

## 2018-01-24 DIAGNOSIS — Z794 Long term (current) use of insulin: Secondary | ICD-10-CM | POA: Diagnosis not present

## 2018-01-24 DIAGNOSIS — H30033 Focal chorioretinal inflammation, peripheral, bilateral: Secondary | ICD-10-CM | POA: Diagnosis not present

## 2018-01-24 DIAGNOSIS — H26492 Other secondary cataract, left eye: Secondary | ICD-10-CM | POA: Diagnosis not present

## 2018-01-24 DIAGNOSIS — Z79899 Other long term (current) drug therapy: Secondary | ICD-10-CM | POA: Diagnosis not present

## 2018-01-24 DIAGNOSIS — E113313 Type 2 diabetes mellitus with moderate nonproliferative diabetic retinopathy with macular edema, bilateral: Secondary | ICD-10-CM | POA: Diagnosis not present

## 2018-01-24 DIAGNOSIS — H3581 Retinal edema: Secondary | ICD-10-CM | POA: Diagnosis not present

## 2018-01-27 DIAGNOSIS — H30033 Focal chorioretinal inflammation, peripheral, bilateral: Secondary | ICD-10-CM | POA: Diagnosis not present

## 2018-01-27 DIAGNOSIS — Z79899 Other long term (current) drug therapy: Secondary | ICD-10-CM | POA: Diagnosis not present

## 2018-02-19 DIAGNOSIS — E11319 Type 2 diabetes mellitus with unspecified diabetic retinopathy without macular edema: Secondary | ICD-10-CM | POA: Diagnosis not present

## 2018-02-19 DIAGNOSIS — D899 Disorder involving the immune mechanism, unspecified: Secondary | ICD-10-CM | POA: Diagnosis not present

## 2018-02-19 DIAGNOSIS — E1122 Type 2 diabetes mellitus with diabetic chronic kidney disease: Secondary | ICD-10-CM | POA: Diagnosis not present

## 2018-02-19 DIAGNOSIS — E1140 Type 2 diabetes mellitus with diabetic neuropathy, unspecified: Secondary | ICD-10-CM | POA: Diagnosis not present

## 2018-02-19 DIAGNOSIS — E038 Other specified hypothyroidism: Secondary | ICD-10-CM | POA: Diagnosis not present

## 2018-02-19 DIAGNOSIS — D649 Anemia, unspecified: Secondary | ICD-10-CM | POA: Diagnosis not present

## 2018-02-19 DIAGNOSIS — Z79899 Other long term (current) drug therapy: Secondary | ICD-10-CM | POA: Diagnosis not present

## 2018-02-19 DIAGNOSIS — N183 Chronic kidney disease, stage 3 (moderate): Secondary | ICD-10-CM | POA: Diagnosis not present

## 2018-02-19 DIAGNOSIS — Z1389 Encounter for screening for other disorder: Secondary | ICD-10-CM | POA: Diagnosis not present

## 2018-02-19 DIAGNOSIS — E668 Other obesity: Secondary | ICD-10-CM | POA: Diagnosis not present

## 2018-02-19 DIAGNOSIS — Z6835 Body mass index (BMI) 35.0-35.9, adult: Secondary | ICD-10-CM | POA: Diagnosis not present

## 2018-02-19 DIAGNOSIS — I129 Hypertensive chronic kidney disease with stage 1 through stage 4 chronic kidney disease, or unspecified chronic kidney disease: Secondary | ICD-10-CM | POA: Diagnosis not present

## 2018-03-09 ENCOUNTER — Ambulatory Visit (HOSPITAL_COMMUNITY)
Admission: EM | Admit: 2018-03-09 | Discharge: 2018-03-09 | Disposition: A | Discharge status: Home / Self Care | Payer: Medicare Other | Attending: Family Medicine | Admitting: Family Medicine

## 2018-03-09 ENCOUNTER — Encounter (HOSPITAL_COMMUNITY): Payer: Self-pay | Admitting: *Deleted

## 2018-03-09 DIAGNOSIS — Z23 Encounter for immunization: Secondary | ICD-10-CM

## 2018-03-09 DIAGNOSIS — W07XXXA Fall from chair, initial encounter: Secondary | ICD-10-CM

## 2018-03-09 DIAGNOSIS — I10 Essential (primary) hypertension: Secondary | ICD-10-CM | POA: Diagnosis not present

## 2018-03-09 DIAGNOSIS — S0003XA Contusion of scalp, initial encounter: Secondary | ICD-10-CM

## 2018-03-09 DIAGNOSIS — W19XXXA Unspecified fall, initial encounter: Secondary | ICD-10-CM

## 2018-03-09 HISTORY — DX: Other retinoschisis and retinal cysts, unspecified eye: H33.199

## 2018-03-09 MED ORDER — TETANUS-DIPHTH-ACELL PERTUSSIS 5-2.5-18.5 LF-MCG/0.5 IM SUSP
0.5000 mL | Freq: Once | INTRAMUSCULAR | Status: AC
  Administered 2018-03-09: 0.5 mL via INTRAMUSCULAR

## 2018-03-09 MED ORDER — TETANUS-DIPHTH-ACELL PERTUSSIS 5-2.5-18.5 LF-MCG/0.5 IM SUSP
INTRAMUSCULAR | Status: AC
  Filled 2018-03-09: qty 0.5

## 2018-03-09 NOTE — ED Provider Notes (Signed)
[facility]    CSN: [id number] Arrival date & time: 03/09/18  1223     History   Chief Complaint Chief Complaint  Patient presents with  . Fall  . Laceration    HPI Tabitha Moore is a 77 y.o. female.   Patient is a 77 year old female presents for fall that occurred last night.  She was stepping onto a chair to get up to a high cabinet when she slipped, falling backwards and striking head on what she believes is another chair.  She instantly grabbed her head and felt blood.  The bleeding stopped about 1 hour after the incident.  She denies any loss of consciousness.   Denies any dizziness, headaches, blurred vision, nausea, vomiting.  She is not currently taking any blood thinners.  She is having some mild soreness to the left trapezius muscle but no cervical midline tenderness.  Denies any back pain.   ROS per HPI      Past Medical History:  Diagnosis Date  . Arthritis   . Complication of anesthesia    pt states "hard to wake up"  . Diabetes mellitus   . Hemorrhoids   . Hyperlipidemia   . Hypertension   . Hypothyroidism   . Retinal cyst     Patient Active Problem List   Diagnosis Date Noted  . High risk medication use 11/17/2016  . DDD (degenerative disc disease), lumbar 11/17/2016  . Primary osteoarthritis of both knees 11/13/2016  . Pain in left ankle and joints of left foot 11/13/2016  . Essential hypertension 10/18/2016  . Left ventricular hypertrophy 10/18/2016  . PVC's (premature ventricular contractions) 10/18/2016  . History of diabetes mellitus 10/18/2016  . History of hypothyroidism 10/18/2016  . Dyslipidemia 10/18/2016  . Obesity (BMI 35.0-39.9 without comorbidity) 10/18/2016  . History of GI diverticular bleed 10/18/2016  . Stage 4 chronic kidney disease ([place]) 10/18/2016  . 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 Internal and external prolapsed hemorrhoids, bleeding. 10/05/2011    Past Surgical History:  Procedure Laterality  Date  . ABDOMINAL HYSTERECTOMY    . BREAST LUMPECTOMY WITH RADIOACTIVE SEED LOCALIZATION Left 08/27/2014   Procedure: BREAST LUMPECTOMY WITH RADIOACTIVE SEED LOCALIZATION;  Surgeon: Autumn Messing III, MD;  Location: [facility];  Service: General;  Laterality: Left;  . CATARACT EXTRACTION    . CHOLECYSTECTOMY      OB History   None      Home Medications    Prior to Admission medications   Medication Sig Start Date End Date Taking? Authorizing Provider  BENICAR 40 MG tablet daily. 09/12/11  Yes [provider]  Cholecalciferol (VITAMIN D PO) Take 1,000 mg by mouth daily.    Yes [provider]  CRESTOR 5 MG tablet 5 mg at bedtime.  09/21/11  Yes [provider]  FOLIC ACID PO Take by mouth.   Yes [provider]  Ginger, Zingiber officinalis, (GINGER EXTRACT PO) Take by mouth.   Yes [provider]  glimepiride (AMARYL) 4 MG tablet BID times 48H. 07/06/11  Yes [provider]  levothyroxine (SYNTHROID, LEVOTHROID) 88 MCG tablet daily. 10/01/11  Yes [provider]  metFORMIN (GLUCOPHAGE-XR) 500 MG 24 hr tablet Take 500 mg by mouth daily with breakfast.   Yes [provider]  METHOTREXATE PO Take by mouth once a week.   Yes [provider]  Misc Natural Products (TART CHERRY ADVANCED PO) Take by mouth.   Yes [provider]  Omega 3  1000 MG CAPS Take by mouth.   Yes [provider]  Turmeric 400 MG CAPS Take by mouth.   Yes [provider]  valACYclovir (VALTREX) 1000 MG tablet Take 1,000 mg by mouth 3 (three) times daily. 06/11/16  Yes [provider]  ONE TOUCH ULTRA TEST test strip CHECK BLOOD SUGAR DAILY. DX CODE-E11.9 08/16/16   [provider]    Family History Family History  Problem Relation Age of Onset  . Cancer Father        lung  . Cancer Sister        breast    Social History Social History   Tobacco Use  . Smoking status: Never  Smoker  . Smokeless tobacco: Never Used  Substance Use Topics  . Alcohol use: No  . Drug use: Never     Allergies   Shellfish allergy   Review of Systems Review of Systems   Physical Exam Triage Vital Signs ED Triage Vitals [03/09/18 1233]  Enc Vitals Group     BP (!) 179/89     Pulse Rate (!) 102     Resp 20     Temp 97.9 F (36.6 C)     Temp src      SpO2 100 %     Weight      Height      Head Circumference      Peak Flow      Pain Score 0     Pain Loc      Pain Edu?      Excl. in [place]?    No data found.  Updated Vital Signs BP (!) 179/89   Pulse (!) 102   Temp 97.9 F (36.6 C)   Resp 20   SpO2 100%   Visual Acuity Right Eye Distance:   Left Eye Distance:   Bilateral Distance:    Right Eye Near:   Left Eye Near:    Bilateral Near:     Physical Exam  Constitutional: She is oriented to person, place, and time. She appears well-developed and well-nourished.  HENT:  Head: Normocephalic.  Right Ear: External ear normal.  Left Ear: External ear normal.  Nose: Nose normal.  Mouth/Throat: Oropharynx is clear and moist.  Small abrasion to the posterior scalp. bleeding controlled. Small hematoma felt. No crepitus or deformity palpated.    Neck: Normal range of motion.  Mild tenderness to left trapezius muscle  Pulmonary/Chest: Effort normal.  Musculoskeletal: Normal range of motion.  Neurological: She is alert and oriented to person, place, and time.  No focal neuro deficits Cranial nerves grossly intact Strength 5/5 in all extremities.   Skin: Skin is warm and dry.  Psychiatric: She has a normal mood and affect.  Nursing note and vitals reviewed.    UC Treatments / Results  Labs (all labs ordered are listed, but only abnormal results are displayed) Labs Reviewed - No data to display  EKG None  Radiology No results found.  Procedures Procedures (including critical care time)  Medications Ordered in UC Medications  Tdap (BOOSTRIX)  injection 0.5 mL (0.5 mLs Intramuscular Given 03/09/18 1253)    Initial Impression / Assessment and Plan / UC Course  I have reviewed the triage vital signs and the nursing notes.  Pertinent labs & imaging results that were available during my care of the patient were reviewed by me and considered in my medical decision making (see chart for details).     Patient is a 77 year old  female presents for fall with scalp injury. There is no laceration to posterior scalp to repair Small abrasion noted without bleeding Small soft area felt, no deformities or crepitus No concerning signs on exam No focal neuro deficits Patient safe to send home and monitor for concerning symptoms.  These were discussed with the patient Instructed that she can wash her hair and scalp like normal just to be gentle Updated tetanus in clinic Patient understanding and agreeable to plan Final Clinical Impressions(s) / UC Diagnoses   Final diagnoses:  Fall, initial encounter  Contusion of scalp, initial encounter     Discharge Instructions     There is no wound here that needs sutures or staples You can go home and wash your hair and scalp normally like you would If the bleeding starts again he can place a bandage over it to help stop the bleeding No concerning signs on exam that may be worry If you develop any blurred vision, severe headache, dizziness, nausea, vomiting please go to the ER    ED Prescriptions    None     Controlled Substance Prescriptions [place] Controlled Substance Registry consulted? Not Applicable   Orvan July, NP 03/09/18 1359

## 2018-03-09 NOTE — Discharge Instructions (Addendum)
There is no wound here that needs sutures or staples You can go home and wash your hair and scalp normally like you would If the bleeding starts again he can place a bandage over it to help stop the bleeding No concerning signs on exam that may be worry If you develop any blurred vision, severe headache, dizziness, nausea, vomiting please go to the ER

## 2018-03-09 NOTE — ED Triage Notes (Signed)
Reports stepping up onto a chair last night when her other leg "didn't make it up", causing her to fall.  Reports hitting her head on rounded corner of table, falling with back to ground, then hitting back of head on chair.  Denies LOC.  Small scabbed laceration noted to posterior scalp.  Denies HA, neck pain, back pain, vision changes or nausea.  Pt alert.

## 2018-03-25 DIAGNOSIS — H30033 Focal chorioretinal inflammation, peripheral, bilateral: Secondary | ICD-10-CM | POA: Diagnosis not present

## 2018-03-26 DIAGNOSIS — H30033 Focal chorioretinal inflammation, peripheral, bilateral: Secondary | ICD-10-CM | POA: Diagnosis not present

## 2018-03-26 DIAGNOSIS — Z79899 Other long term (current) drug therapy: Secondary | ICD-10-CM | POA: Diagnosis not present

## 2018-04-11 NOTE — Progress Notes (Signed)
Office Visit Note  Patient: Tabitha Moore             Date of Birth: [birth year]           MRN: [id number]             PCP: Shon Baton, MD Referring: Shon Baton, MD Visit Date: 04/25/2018 Occupation: '@GUAROCC'$ @  Subjective:  Pain in both feet   History of Present Illness: Tabitha Moore is a 78 y.o. female with history of chorioretinitis, osteoarthritis, and DDD.  She is currently taking methotrexate 10 tablets by mouth once weekly.  She states that her dose of methotrexate was increased about 2 weeks ago.  She reports he continues to see Dr. Manuella Ghazi on a regular basis.  She denies any eye pain, dryness, or photophobia at this time.  She denies any drainage or redness.  She states that occasionally she will have eye dryness and uses Systane eyedrops. Patient reports that she continues to have pain in bilateral feet.  She states that she is stiffness in bilateral great toes.  She states that she does have a hammertoe of the right second toe.  She states that she has noticed increased overcrowding which causes some discomfort.  She is worn orthotics in the past which helped with plantar fasciitis.  She states that she experiences a burning and tingling sensation in her feet.  She reports that she does have diabetic foot exams on a regular basis.  Patient reports that she continues to have chronic lower back pain especially walking for long distances.  She denies any other joint pain or joint swelling at this time.   Activities of Daily Living:  Patient reports morning stiffness for 1 hour.   Patient Reports nocturnal pain.  Difficulty dressing/grooming: Denies Difficulty climbing stairs: Reports Difficulty getting out of chair: Reports Difficulty using hands for taps, buttons, cutlery, and/or writing: Denies  Review of Systems  Constitutional: Positive for fatigue.  HENT: Negative for mouth sores, mouth dryness and nose dryness.   Eyes: Positive for dryness. Negative for pain and visual  disturbance.  Respiratory: Negative for cough, hemoptysis, shortness of breath and difficulty breathing.   Cardiovascular: Negative for chest pain, palpitations, hypertension and swelling in legs/feet.  Gastrointestinal: Negative for blood in stool, constipation and diarrhea.  Endocrine: Negative for increased urination.  Genitourinary: Negative for painful urination.  Musculoskeletal: Positive for arthralgias, joint pain, joint swelling and morning stiffness. Negative for myalgias, muscle weakness, muscle tenderness and myalgias.  Skin: Negative for color change, pallor, rash, hair loss, nodules/bumps, skin tightness, ulcers and sensitivity to sunlight.  Allergic/Immunologic: Negative for susceptible to infections.  Neurological: Negative for dizziness, numbness, headaches and weakness.  Hematological: Negative for swollen glands.  Psychiatric/Behavioral: Negative for depressed mood and sleep disturbance. The patient is not nervous/anxious.     PMFS History:  Patient Active Problem List   Diagnosis Date Noted  . High risk medication use 11/17/2016  . DDD (degenerative disc disease), lumbar 11/17/2016  . Primary osteoarthritis of both knees 11/13/2016  . Pain in left ankle and joints of left foot 11/13/2016  . Essential hypertension 10/18/2016  . Left ventricular hypertrophy 10/18/2016  . PVC's (premature ventricular contractions) 10/18/2016  . History of diabetes mellitus 10/18/2016  . History of hypothyroidism 10/18/2016  . Dyslipidemia 10/18/2016  . Obesity (BMI 35.0-39.9 without comorbidity) 10/18/2016  . History of GI diverticular bleed 10/18/2016  . Stage 4 chronic kidney disease ([place]) 10/18/2016  . 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  Internal and external prolapsed hemorrhoids, bleeding. 10/05/2011    Past Medical History:  Diagnosis Date  . Arthritis   . Complication of anesthesia    pt states "hard to wake up"  . Diabetes mellitus   . Hemorrhoids   .  Hyperlipidemia   . Hypertension   . Hypothyroidism   . Retinal cyst     Family History  Problem Relation Age of Onset  . Cancer Father        lung  . Cancer Sister        breast   Past Surgical History:  Procedure Laterality Date  . ABDOMINAL HYSTERECTOMY    . BREAST LUMPECTOMY WITH RADIOACTIVE SEED LOCALIZATION Left 08/27/2014   Procedure: BREAST LUMPECTOMY WITH RADIOACTIVE SEED LOCALIZATION;  Surgeon: Autumn Messing III, MD;  Location: [facility];  Service: General;  Laterality: Left;  . CATARACT EXTRACTION    . CHOLECYSTECTOMY     Social History   Social History Narrative  . Not on file    Objective: Vital Signs: BP (!) 160/86 (BP Location: Left Arm, Patient Position: Sitting, Cuff Size: Normal)   Pulse 93   Resp 18   Ht '5\' 3"'$  (1.6 m)   Wt 196 lb 6.4 oz (89.1 kg)   BMI 34.79 kg/m    Physical Exam Vitals signs and nursing note reviewed.  Constitutional:      Appearance: She is well-developed.  HENT:     Head: Normocephalic and atraumatic.  Eyes:     Conjunctiva/sclera: Conjunctivae normal.  Neck:     Musculoskeletal: Normal range of motion.  Cardiovascular:     Rate and Rhythm: Normal rate and regular rhythm.     Heart sounds: Normal heart sounds.  Pulmonary:     Effort: Pulmonary effort is normal.     Breath sounds: Normal breath sounds.  Abdominal:     General: Bowel sounds are normal.     Palpations: Abdomen is soft.  Lymphadenopathy:     Cervical: No cervical adenopathy.  Skin:    General: Skin is warm and dry.     Capillary Refill: Capillary refill takes less than 2 seconds.  Neurological:     Mental Status: She is alert and oriented to person, place, and time.  Psychiatric:        Behavior: Behavior normal.      Musculoskeletal Exam: C-spine, thoracic spine, lumbar spine good range of motion.  No midline spinal tenderness.  No SI joint tenderness.  Shoulder joints, with joints, wrist joints, MCPs, PIPs, DIPs good range of motion with  no synovitis.  She has complete fist formation bilaterally.  Hip joints good range of motion with no discomfort.  No tenderness over trochanteric bursa bilaterally.  Knee joints good range of motion with no warmth or effusion.  She has some diffuse swelling of the right ankle joint with mild warmth.  She has first MTP synovial thickening in bilateral feet.  She has a hammertoe of the right second digit.  She has PIP and DIP synovial thickening consistent with osteoarthritis of bilateral feet.  CDAI Exam: CDAI Score: Not documented Patient Global Assessment: Not documented; Provider Global Assessment: Not documented Swollen: Not documented; Tender: Not documented Joint Exam   Not documented   There is currently no information documented on the homunculus. Go to the Rheumatology activity and complete the homunculus joint exam.  Investigation: No additional findings.  Imaging: No results found.  Recent Labs: Lab Results  Component Value Date   WBC  10.0 12/17/2016   HGB 11.0 (L) 12/17/2016   PLT 308 12/17/2016   NA 135 12/17/2016   K 3.8 12/17/2016   CL 96 (L) 12/17/2016   CO2 29 12/17/2016   GLUCOSE 220 (H) 12/17/2016   BUN 56 (H) 12/17/2016   CREATININE 1.14 (H) 12/17/2016   CALCIUM 8.8 (L) 12/17/2016   GFRAA 52 (L) 12/17/2016    Speciality Comments: No specialty comments available.  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 Brigitte Pulse.  She is currently on methotrexate 10 tablets by mouth once weekly and folic acid 2 mg by mouth daily.  She denies any eye dryness, eye redness, photophobia, eye drainage, or eye pain at this time.  She will be following up with Dr. Manuella Ghazi on a regular basis.  According to the patient Dr. Manuella Ghazi checks her lab work at every visit.  She was advised to notify him if she develops  signs or symptoms of a flare.  High risk medication use - MTX 10 tablets po once weekly and folic acid 2 mg po daily-prescribed by Dr. Manuella Ghazi.  She recently increased her dose of methotrexate to 10 tablets by mouth once weekly according to the patient Dr. Manuella Ghazi checks her lab work at every visit.  Most recent CBC and CMP 03/26/18.    Pain in both feet: She has PIP and DIP synovial thickening consistent with osteoarthritis of bilateral feet.  She has overcrowding of toes.  She has first MTP joint synovial thickening.  She continues to have diffuse swelling and warmth of the right ankle joint.  She has been experiencing a burning/tingling sensation in both feet.  She has diabetic foot exams on a regular basis.  She denies a history of neuropathy.  We discussed using spacers as well as metatarsal pads.  She has used orthotics in the past which have helped.  We discussed the importance of wearing proper fitting shoes.  She was given a handout of ankle exercises that she can perform at home.  We will refer her to Dr. Sharol Given for further evaluation.   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  Good range of motion with no discomfort.  DDD (degenerative disc disease), lumbar: She has intermittent lower back discomfort.  She reports that the pain is exacerbated by walking for long distances.  Other medical conditions are listed as follows:  Stage 4 chronic kidney disease ([place])  Dyslipidemia  History of obesity  History of diabetes mellitus  Left ventricular hypertrophy  History of GI diverticular bleed  History of hypertension  History of hypothyroidism   Orders: No orders of the defined types were placed in this encounter.  No orders of the defined types were placed in this encounter.   Face-to-face time spent with patient was 30 minutes. Greater than 50% of time was spent in counseling and coordination of care.  Follow-Up Instructions: Return  in about 5 months (around 09/24/2018) for Osteoarthritis, DDD.   Ofilia Neas, PA-C   I examined and evaluated the patient with Hazel Sams PA.  Patient had no synovitis on examination today.  She continues to have some synovial thickening and osteoarthritis which causes discomfort.  She has been followed by Dr. Manuella Ghazi for chorioretinitis.  He has been prescribing methotrexate and monitoring the medication.  The plan of care was discussed as noted above.  Bo Merino, MD  Note - This record has been created using Editor, commissioning.  Chart creation errors have been sought, but may not always  have been located. Such creation errors do not reflect on  the standard of medical care.

## 2018-04-25 ENCOUNTER — Encounter: Payer: Self-pay | Admitting: Rheumatology

## 2018-04-25 ENCOUNTER — Ambulatory Visit (INDEPENDENT_AMBULATORY_CARE_PROVIDER_SITE_OTHER): Payer: Medicare Other | Admitting: Rheumatology

## 2018-04-25 VITALS — BP 160/86 | HR 93 | Resp 18 | Ht 63.0 in | Wt 196.4 lb

## 2018-04-25 DIAGNOSIS — Z8719 Personal history of other diseases of the digestive system: Secondary | ICD-10-CM | POA: Diagnosis not present

## 2018-04-25 DIAGNOSIS — H3093 Unspecified chorioretinal inflammation, bilateral: Secondary | ICD-10-CM | POA: Diagnosis not present

## 2018-04-25 DIAGNOSIS — I517 Cardiomegaly: Secondary | ICD-10-CM | POA: Diagnosis not present

## 2018-04-25 DIAGNOSIS — Z8679 Personal history of other diseases of the circulatory system: Secondary | ICD-10-CM

## 2018-04-25 DIAGNOSIS — E785 Hyperlipidemia, unspecified: Secondary | ICD-10-CM | POA: Diagnosis not present

## 2018-04-25 DIAGNOSIS — M5136 Other intervertebral disc degeneration, lumbar region: Secondary | ICD-10-CM | POA: Diagnosis not present

## 2018-04-25 DIAGNOSIS — Z8639 Personal history of other endocrine, nutritional and metabolic disease: Secondary | ICD-10-CM

## 2018-04-25 DIAGNOSIS — M79671 Pain in right foot: Secondary | ICD-10-CM

## 2018-04-25 DIAGNOSIS — M170 Bilateral primary osteoarthritis of knee: Secondary | ICD-10-CM

## 2018-04-25 DIAGNOSIS — M79672 Pain in left foot: Secondary | ICD-10-CM | POA: Diagnosis not present

## 2018-04-25 DIAGNOSIS — Z79899 Other long term (current) drug therapy: Secondary | ICD-10-CM | POA: Diagnosis not present

## 2018-04-25 NOTE — Patient Instructions (Signed)
Ankle Exercises  Ask your health care provider which exercises are safe for you. Do exercises exactly as told by your health care provider and adjust them as directed. It is normal to feel mild stretching, pulling, tightness, or discomfort as you do these exercises, but you should stop right away if you feel sudden pain or your pain gets worse. Do not begin these exercises until told by your health care provider.  Stretching and range of motion exercises  These exercises warm up your muscles and joints and improve the movement and flexibility of your ankle. These exercises also help to relieve pain, numbness, and tingling.  Exercise A: Dorsiflexion/plantar flexion    1. Sit with your __________ knee straight or bent. Do not rest your foot on anything.  2. Flex your __________ ankle to tilt the top of your foot toward your shin.  3. Hold this position for __________ seconds.  4. Point your toes downward to tilt the top of your foot away from your shin.  5. Hold this position for __________ seconds.  Repeat __________ times. Complete this exercise __________ times a day.  Exercise B: Ankle alphabet    1. Sit with your __________ foot supported at your lower leg.  ? Do not rest your foot on anything.  ? Make sure your foot has room to move freely.  2. Think of your __________ foot as a paintbrush, and move your foot to trace each letter of the alphabet in the air. Keep your hip and knee still while you trace. Make the letters as large as you can without increasing any discomfort.  3. Trace every letter from A to Z.  Repeat __________ times. Complete this exercise __________ times a day.  Exercise C: Ankle dorsiflexion, passive  1. Sit on a chair that is placed on a non-carpeted surface.  2. Place your __________ foot on the floor, directly under your __________ knee. Extend your __________ leg for support.  3. Keeping your heel down, slide your __________ foot back toward the chair until you feel a stretch at your  ankle or calf. If you do not feel a stretch, slide your buttocks forward to the edge of the chair.  4. Hold this stretch for __________ seconds.  Repeat __________ times. Complete this stretch __________ times a day.  Strengthening exercises  These exercises build strength and endurance in your ankle. Endurance is the ability to use your muscles for a long time, even after they get tired.  Exercise D: Dorsiflexors    1. Secure a rubber exercise band or tube to an object, such as a table leg, that will stay still when the band is pulled. Secure the other end around your __________ foot.  2. Sit on the floor, facing the object with your __________ leg extended. The band or tube should be slightly tense when your foot is relaxed.  3. Slowly flex your __________ ankle and toes to bring your foot toward you.  4. Hold this position for __________ seconds.  5. Slowly return your foot to the starting position, controlling the band as you do that.  Repeat __________ times. Complete this exercise __________ times a day.  Exercise E: Plantar flexors    1. Sit on the floor with your __________ leg extended.  2. Loop a rubber exercise band or tube around the ball of your __________ foot. The ball of your foot is on the walking surface, right under your toes. The band or tube should be slightly tense   when your foot is relaxed.  3. Slowly point your toes downward, pushing them away from you.  4. Hold this position for __________ seconds.  5. Slowly release the tension in the band or tube, controlling smoothly until your foot is back in the starting position.  Repeat __________ times. Complete this exercise __________ times a day.  Exercise F: Towel curls    1. Sit in a chair on a non-carpeted surface, and put your feet on the floor.  2. Place a towel in front of your feet. If told by your health care provider, add __________ to the end of the towel.  3. Keeping your heel on the floor, put your __________ foot on the  towel.  4. Pull the towel toward you by grabbing the towel with your toes and curling them under. Keep your heel on the floor.  5. Let your toes relax.  6. Grab the towel again. Keep going until the towel is completely underneath your foot.  Repeat __________ times. Complete this exercise __________ times a day.  Exercise G: Heel raise (plantar flexors, standing)    1. Stand with your feet shoulder-width apart.  2. Keep your weight spread evenly over the width of your feet while you rise up on your toes. Use a wall or table to steady yourself, but try not to use it for support.  3. If this exercise is too easy, try these options:  ? Shift your weight toward your __________ leg until you feel challenged.  ? If told by your health care provider, lift your uninjured leg off the floor.  4. Hold this position for __________ seconds.  Repeat __________ times. Complete this exercise __________ times a day.  Exercise H: Tandem walking  1. Stand with one foot directly in front of the other.  2. Slowly raise your back foot up, lifting your heel before your toes, and place it directly in front of your other foot.  3. Continue to walk in this heel-to-toe way for __________ or for as long as told by your health care provider. Have a countertop or wall nearby to use if needed to keep your balance, but try not to hold onto anything for support.  Repeat __________ times. Complete this exercises __________ times a day.  This information is not intended to replace advice given to you by your health care provider. Make sure you discuss any questions you have with your health care provider.  Document Released: 02/07/2005 Document Revised: 11/08/2016 Document Reviewed: 12/12/2014  Elsevier Interactive Patient Education © 2019 [employer]

## 2018-05-05 ENCOUNTER — Encounter (INDEPENDENT_AMBULATORY_CARE_PROVIDER_SITE_OTHER): Payer: Self-pay | Admitting: Orthopedic Surgery

## 2018-05-05 ENCOUNTER — Ambulatory Visit (INDEPENDENT_AMBULATORY_CARE_PROVIDER_SITE_OTHER): Payer: Medicare Other | Admitting: Orthopedic Surgery

## 2018-05-05 ENCOUNTER — Ambulatory Visit (INDEPENDENT_AMBULATORY_CARE_PROVIDER_SITE_OTHER): Payer: Self-pay

## 2018-05-05 VITALS — Ht 63.0 in | Wt 196.0 lb

## 2018-05-05 DIAGNOSIS — M79672 Pain in left foot: Secondary | ICD-10-CM

## 2018-05-05 DIAGNOSIS — M2022 Hallux rigidus, left foot: Secondary | ICD-10-CM | POA: Diagnosis not present

## 2018-05-05 DIAGNOSIS — M2021 Hallux rigidus, right foot: Secondary | ICD-10-CM

## 2018-05-05 DIAGNOSIS — M79671 Pain in right foot: Secondary | ICD-10-CM

## 2018-05-05 NOTE — Progress Notes (Signed)
Office Visit Note   Patient: Tabitha Moore           Date of Birth: 07-26-1940           MRN: [id number] Visit Date: 05/05/2018              Requested by: Ofilia Neas, PA-C [address] [place], [place] [address] PCP: Shon Baton, MD  Chief Complaint  Patient presents with  . Left Foot - Pain  . Right Foot - Pain      HPI: Patient is an 78 year old woman who complains of pain at the MTP joint of the great toe bilaterally with pain in the first webspace.  Pain is worse with weightbearing.  Past medical history patient does have rheumatoid arthritis and is on methotrexate.  Patient also has history of diabetes.  Assessment & Plan: Visit Diagnoses:  1. Pain in left foot   2. Pain in right foot   3. Hallux rigidus, bilateral     Plan: Patient's symptoms seem to be primarily coming from her hallux rigidus.  Recommended a stiff soled sneaker over-the-counter orthotics and she was given a spacer to place in the first webspace bilaterally.  Follow-Up Instructions: Return if symptoms worsen or fail to improve.   Ortho Exam  Patient is alert, oriented, no adenopathy, well-dressed, normal affect, normal respiratory effort. Examination patient has a good dorsalis pedis and posterior tibial pulse bilaterally.  She has dorsiflexion about 10 degrees past neutral with her knee extended.  Patient has significant hallux rigidus with dorsiflexion only about 20 degrees.  She also has mild valgus deformity with the great toe putting pressure on the second toe but there were no open ulcers.  Patient was given a spacer to place in the webspace to unload pressure from the great toe and second toe.  Imaging: Xr Foot Complete Left  Result Date: 05/05/2018 3 view radiographs of the left foot shows a mild bunion deformity with arthritic changes across the MTP joint of the left great toe  Xr Foot Complete Right  Result Date: 05/05/2018 2 view radiographs of the right foot shows a  mild bunion deformity with arthritic changes across the MTP joint of the right great toe  No images are attached to the encounter.  Labs: Lab Results  Component Value Date   ESRSEDRATE 22 10/23/2017   ESRSEDRATE 48 (H) 10/19/2016   LABURIC 5.3 10/23/2017     Lab Results  Component Value Date   LABURIC 5.3 10/23/2017    Body mass index is 34.72 kg/m.  Orders:  Orders Placed This Encounter  Procedures  . XR Foot Complete Left  . XR Foot Complete Right   No orders of the defined types were placed in this encounter.    Procedures: No procedures performed  Clinical Data: No additional findings.  ROS:  All other systems negative, except as noted in the HPI. Review of Systems  Objective: Vital Signs: Ht 5\' 3"  (1.6 m)   Wt 196 lb (88.9 kg)   BMI 34.72 kg/m   Specialty Comments:  No specialty comments available.  PMFS History: Patient Active Problem List   Diagnosis Date Noted  . Hallux rigidus, bilateral 05/05/2018  . High risk medication use 11/17/2016  . DDD (degenerative disc disease), lumbar 11/17/2016  . Primary osteoarthritis of both knees 11/13/2016  . Pain in left ankle and joints of left foot 11/13/2016  . Essential hypertension 10/18/2016  . Left ventricular hypertrophy 10/18/2016  . PVC's (  premature ventricular contractions) 10/18/2016  . History of diabetes mellitus 10/18/2016  . History of hypothyroidism 10/18/2016  . Dyslipidemia 10/18/2016  . Obesity (BMI 35.0-39.9 without comorbidity) 10/18/2016  . History of GI diverticular bleed 10/18/2016  . Stage 4 chronic kidney disease ([place]) 10/18/2016  . 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 Internal and external prolapsed hemorrhoids, bleeding. 10/05/2011   Past Medical History:  Diagnosis Date  . Arthritis   . Complication of anesthesia    pt states "hard to wake up"  . Diabetes mellitus   . Hemorrhoids   . Hyperlipidemia   . Hypertension   . Hypothyroidism   . Retinal cyst       Family History  Problem Relation Age of Onset  . Cancer Father        lung  . Cancer Sister        breast    Past Surgical History:  Procedure Laterality Date  . ABDOMINAL HYSTERECTOMY    . BREAST LUMPECTOMY WITH RADIOACTIVE SEED LOCALIZATION Left 08/27/2014   Procedure: BREAST LUMPECTOMY WITH RADIOACTIVE SEED LOCALIZATION;  Surgeon: Autumn Messing III, MD;  Location: [facility];  Service: General;  Laterality: Left;  . CATARACT EXTRACTION    . CHOLECYSTECTOMY     Social History   Occupational History  . Not on file  Tobacco Use  . Smoking status: Never Smoker  . Smokeless tobacco: Never Used  Substance and Sexual Activity  . Alcohol use: No  . Drug use: Never  . Sexual activity: Not on file

## 2018-05-12 DIAGNOSIS — N183 Chronic kidney disease, stage 3 (moderate): Secondary | ICD-10-CM | POA: Diagnosis not present

## 2018-05-12 DIAGNOSIS — M8589 Other specified disorders of bone density and structure, multiple sites: Secondary | ICD-10-CM | POA: Diagnosis not present

## 2018-05-12 DIAGNOSIS — M859 Disorder of bone density and structure, unspecified: Secondary | ICD-10-CM | POA: Diagnosis not present

## 2018-06-03 DIAGNOSIS — Z79899 Other long term (current) drug therapy: Secondary | ICD-10-CM | POA: Diagnosis not present

## 2018-06-03 DIAGNOSIS — Z794 Long term (current) use of insulin: Secondary | ICD-10-CM | POA: Diagnosis not present

## 2018-06-03 DIAGNOSIS — H26492 Other secondary cataract, left eye: Secondary | ICD-10-CM | POA: Diagnosis not present

## 2018-06-03 DIAGNOSIS — Z961 Presence of intraocular lens: Secondary | ICD-10-CM | POA: Diagnosis not present

## 2018-06-03 DIAGNOSIS — E113313 Type 2 diabetes mellitus with moderate nonproliferative diabetic retinopathy with macular edema, bilateral: Secondary | ICD-10-CM | POA: Diagnosis not present

## 2018-06-03 DIAGNOSIS — H3581 Retinal edema: Secondary | ICD-10-CM | POA: Diagnosis not present

## 2018-06-03 DIAGNOSIS — H30033 Focal chorioretinal inflammation, peripheral, bilateral: Secondary | ICD-10-CM | POA: Diagnosis not present

## 2018-06-05 DIAGNOSIS — K625 Hemorrhage of anus and rectum: Secondary | ICD-10-CM | POA: Diagnosis not present

## 2018-06-05 DIAGNOSIS — K601 Chronic anal fissure: Secondary | ICD-10-CM | POA: Diagnosis not present

## 2018-06-05 DIAGNOSIS — K5900 Constipation, unspecified: Secondary | ICD-10-CM | POA: Diagnosis not present

## 2018-06-05 DIAGNOSIS — K219 Gastro-esophageal reflux disease without esophagitis: Secondary | ICD-10-CM | POA: Diagnosis not present

## 2018-06-13 DIAGNOSIS — E038 Other specified hypothyroidism: Secondary | ICD-10-CM | POA: Diagnosis not present

## 2018-06-13 DIAGNOSIS — E7849 Other hyperlipidemia: Secondary | ICD-10-CM | POA: Diagnosis not present

## 2018-06-13 DIAGNOSIS — R82998 Other abnormal findings in urine: Secondary | ICD-10-CM | POA: Diagnosis not present

## 2018-06-13 DIAGNOSIS — I10 Essential (primary) hypertension: Secondary | ICD-10-CM | POA: Diagnosis not present

## 2018-06-13 DIAGNOSIS — M859 Disorder of bone density and structure, unspecified: Secondary | ICD-10-CM | POA: Diagnosis not present

## 2018-06-13 DIAGNOSIS — E11319 Type 2 diabetes mellitus with unspecified diabetic retinopathy without macular edema: Secondary | ICD-10-CM | POA: Diagnosis not present

## 2018-06-20 DIAGNOSIS — Z1331 Encounter for screening for depression: Secondary | ICD-10-CM | POA: Diagnosis not present

## 2018-06-20 DIAGNOSIS — E1140 Type 2 diabetes mellitus with diabetic neuropathy, unspecified: Secondary | ICD-10-CM | POA: Diagnosis not present

## 2018-06-20 DIAGNOSIS — E7849 Other hyperlipidemia: Secondary | ICD-10-CM | POA: Diagnosis not present

## 2018-06-20 DIAGNOSIS — N183 Chronic kidney disease, stage 3 (moderate): Secondary | ICD-10-CM | POA: Diagnosis not present

## 2018-06-20 DIAGNOSIS — E1122 Type 2 diabetes mellitus with diabetic chronic kidney disease: Secondary | ICD-10-CM | POA: Diagnosis not present

## 2018-06-20 DIAGNOSIS — Z79899 Other long term (current) drug therapy: Secondary | ICD-10-CM | POA: Diagnosis not present

## 2018-06-20 DIAGNOSIS — D899 Disorder involving the immune mechanism, unspecified: Secondary | ICD-10-CM | POA: Diagnosis not present

## 2018-06-20 DIAGNOSIS — E11319 Type 2 diabetes mellitus with unspecified diabetic retinopathy without macular edema: Secondary | ICD-10-CM | POA: Diagnosis not present

## 2018-06-20 DIAGNOSIS — I129 Hypertensive chronic kidney disease with stage 1 through stage 4 chronic kidney disease, or unspecified chronic kidney disease: Secondary | ICD-10-CM | POA: Diagnosis not present

## 2018-06-20 DIAGNOSIS — H209 Unspecified iridocyclitis: Secondary | ICD-10-CM | POA: Diagnosis not present

## 2018-06-20 DIAGNOSIS — Z1339 Encounter for screening examination for other mental health and behavioral disorders: Secondary | ICD-10-CM | POA: Diagnosis not present

## 2018-06-20 DIAGNOSIS — Z0000 Encounter for general adult medical examination without abnormal findings: Secondary | ICD-10-CM | POA: Diagnosis not present

## 2018-07-17 ENCOUNTER — Ambulatory Visit: Payer: BLUE CROSS/BLUE SHIELD | Admitting: Rheumatology

## 2018-09-12 NOTE — Progress Notes (Deleted)
Office Visit Note  Patient: Tabitha Moore             Date of Birth: 1940/04/21           MRN: [id number]             PCP: Shon Baton, MD Referring: Shon Baton, MD Visit Date: 09/26/2018 Occupation: @GUAROCC @  Subjective:  No chief complaint on file.   History of Present Illness: Tabitha Moore is a 78 y.o. female ***   Activities of Daily Living:  Patient reports morning stiffness for *** {minute/hour:19697}.   Patient {ACTIONS;DENIES/REPORTS:21021675::"Denies"} nocturnal pain.  Difficulty dressing/grooming: {ACTIONS;DENIES/REPORTS:21021675::"Denies"} Difficulty climbing stairs: {ACTIONS;DENIES/REPORTS:21021675::"Denies"} Difficulty getting out of chair: {ACTIONS;DENIES/REPORTS:21021675::"Denies"} Difficulty using hands for taps, buttons, cutlery, and/or writing: {ACTIONS;DENIES/REPORTS:21021675::"Denies"}  No Rheumatology ROS completed.   PMFS History:  Patient Active Problem List   Diagnosis Date Noted  . Hallux rigidus, bilateral 05/05/2018  . High risk medication use 11/17/2016  . DDD (degenerative disc disease), lumbar 11/17/2016  . Primary osteoarthritis of both knees 11/13/2016  . Pain in left ankle and joints of left foot 11/13/2016  . Essential hypertension 10/18/2016  . Left ventricular hypertrophy 10/18/2016  . PVC's (premature ventricular contractions) 10/18/2016  . History of diabetes mellitus 10/18/2016  . History of hypothyroidism 10/18/2016  . Dyslipidemia 10/18/2016  . Obesity (BMI 35.0-39.9 without comorbidity) 10/18/2016  . History of GI diverticular bleed 10/18/2016  . Stage 4 chronic kidney disease ([place]) 10/18/2016  . 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 Internal and external prolapsed hemorrhoids, bleeding. 10/05/2011    Past Medical History:  Diagnosis Date  . Arthritis   . Complication of anesthesia    pt states "hard to wake up"  . Diabetes mellitus   . Hemorrhoids   . Hyperlipidemia   . Hypertension   . Hypothyroidism   .  Retinal cyst     Family History  Problem Relation Age of Onset  . Cancer Father        lung  . Cancer Sister        breast   Past Surgical History:  Procedure Laterality Date  . ABDOMINAL HYSTERECTOMY    . BREAST LUMPECTOMY WITH RADIOACTIVE SEED LOCALIZATION Left 08/27/2014   Procedure: BREAST LUMPECTOMY WITH RADIOACTIVE SEED LOCALIZATION;  Surgeon: Autumn Messing III, MD;  Location: [facility];  Service: General;  Laterality: Left;  . CATARACT EXTRACTION    . CHOLECYSTECTOMY     Social History   Social History Narrative  . Not on file   Immunization History  Administered Date(s) Administered  . Tdap 03/09/2018     Objective: Vital Signs: There were no vitals taken for this visit.   Physical Exam   Musculoskeletal Exam: ***  CDAI Exam: CDAI Score: Not documented Patient Global Assessment: Not documented; Provider Global Assessment: Not documented Swollen: Not documented; Tender: Not documented Joint Exam   Not documented   There is currently no information documented on the homunculus. Go to the Rheumatology activity and complete the homunculus joint exam.  Investigation: No additional findings.  Imaging: No results found.  Recent Labs: Lab Results  Component Value Date   WBC 10.0 12/17/2016   HGB 11.0 (L) 12/17/2016   PLT 308 12/17/2016   NA 135 12/17/2016   K 3.8 12/17/2016   CL 96 (L) 12/17/2016   CO2 29 12/17/2016   GLUCOSE 220 (H) 12/17/2016   BUN 56 (H) 12/17/2016   CREATININE 1.14 (H) 12/17/2016   CALCIUM 8.8 (L) 12/17/2016  GFRAA 52 (L) 12/17/2016    Speciality Comments: No specialty comments available.  Procedures:  No procedures performed Allergies: Shellfish allergy   Assessment / Plan:     Visit Diagnoses: No diagnosis found.   Orders: No orders of the defined types were placed in this encounter.  No orders of the defined types were placed in this encounter.   Face-to-face time spent with patient was *** minutes.  Greater than 50% of time was spent in counseling and coordination of care.  Follow-Up Instructions: No follow-ups on file.   Ofilia Neas, PA-C  Note - This record has been created using Dragon software.  Chart creation errors have been sought, but may not always  have been located. Such creation errors do not reflect on  the standard of medical care.

## 2018-09-26 ENCOUNTER — Ambulatory Visit: Payer: Self-pay | Admitting: Rheumatology

## 2018-10-06 DIAGNOSIS — N183 Chronic kidney disease, stage 3 (moderate): Secondary | ICD-10-CM | POA: Diagnosis not present

## 2018-10-06 DIAGNOSIS — J329 Chronic sinusitis, unspecified: Secondary | ICD-10-CM | POA: Diagnosis not present

## 2018-10-06 DIAGNOSIS — J029 Acute pharyngitis, unspecified: Secondary | ICD-10-CM | POA: Diagnosis not present

## 2018-10-06 DIAGNOSIS — I129 Hypertensive chronic kidney disease with stage 1 through stage 4 chronic kidney disease, or unspecified chronic kidney disease: Secondary | ICD-10-CM | POA: Diagnosis not present

## 2018-10-06 DIAGNOSIS — R42 Dizziness and giddiness: Secondary | ICD-10-CM | POA: Diagnosis not present

## 2018-10-27 ENCOUNTER — Other Ambulatory Visit: Payer: Self-pay | Admitting: Internal Medicine

## 2018-10-27 ENCOUNTER — Ambulatory Visit
Admission: RE | Admit: 2018-10-27 | Discharge: 2018-10-27 | Disposition: A | Discharge status: Home / Self Care | Payer: Medicare Other | Source: Ambulatory Visit | Attending: Adult Health | Admitting: Adult Health

## 2018-10-27 ENCOUNTER — Other Ambulatory Visit: Payer: Self-pay | Admitting: Adult Health

## 2018-10-27 ENCOUNTER — Other Ambulatory Visit: Payer: Self-pay

## 2018-10-27 DIAGNOSIS — R279 Unspecified lack of coordination: Secondary | ICD-10-CM | POA: Diagnosis not present

## 2018-10-27 DIAGNOSIS — M542 Cervicalgia: Secondary | ICD-10-CM

## 2018-10-27 DIAGNOSIS — E1122 Type 2 diabetes mellitus with diabetic chronic kidney disease: Secondary | ICD-10-CM | POA: Diagnosis not present

## 2018-10-27 DIAGNOSIS — R5383 Other fatigue: Secondary | ICD-10-CM | POA: Diagnosis not present

## 2018-10-27 DIAGNOSIS — I129 Hypertensive chronic kidney disease with stage 1 through stage 4 chronic kidney disease, or unspecified chronic kidney disease: Secondary | ICD-10-CM | POA: Diagnosis not present

## 2018-10-27 DIAGNOSIS — R42 Dizziness and giddiness: Secondary | ICD-10-CM | POA: Diagnosis not present

## 2018-10-27 DIAGNOSIS — R2689 Other abnormalities of gait and mobility: Secondary | ICD-10-CM

## 2018-10-27 DIAGNOSIS — E039 Hypothyroidism, unspecified: Secondary | ICD-10-CM | POA: Diagnosis not present

## 2018-10-27 DIAGNOSIS — N183 Chronic kidney disease, stage 3 (moderate): Secondary | ICD-10-CM | POA: Diagnosis not present

## 2018-10-27 DIAGNOSIS — R000 Tachycardia, unspecified: Secondary | ICD-10-CM | POA: Diagnosis not present

## 2018-10-27 DIAGNOSIS — I517 Cardiomegaly: Secondary | ICD-10-CM | POA: Diagnosis not present

## 2018-10-28 ENCOUNTER — Other Ambulatory Visit: Payer: Medicare Other

## 2018-10-31 ENCOUNTER — Ambulatory Visit
Admission: RE | Admit: 2018-10-31 | Discharge: 2018-10-31 | Disposition: A | Discharge status: Home / Self Care | Payer: Medicare Other | Source: Ambulatory Visit | Attending: Internal Medicine | Admitting: Internal Medicine

## 2018-10-31 ENCOUNTER — Other Ambulatory Visit: Payer: Self-pay

## 2018-10-31 DIAGNOSIS — R42 Dizziness and giddiness: Secondary | ICD-10-CM | POA: Diagnosis not present

## 2018-10-31 DIAGNOSIS — R2689 Other abnormalities of gait and mobility: Secondary | ICD-10-CM

## 2018-11-04 ENCOUNTER — Other Ambulatory Visit: Payer: Self-pay

## 2018-11-04 ENCOUNTER — Encounter: Payer: Self-pay | Admitting: Cardiology

## 2018-11-04 ENCOUNTER — Ambulatory Visit (INDEPENDENT_AMBULATORY_CARE_PROVIDER_SITE_OTHER): Payer: Medicare Other | Admitting: Cardiology

## 2018-11-04 VITALS — Ht 62.0 in | Wt 195.7 lb

## 2018-11-04 DIAGNOSIS — R0609 Other forms of dyspnea: Secondary | ICD-10-CM

## 2018-11-04 DIAGNOSIS — R42 Dizziness and giddiness: Secondary | ICD-10-CM | POA: Diagnosis not present

## 2018-11-04 DIAGNOSIS — I10 Essential (primary) hypertension: Secondary | ICD-10-CM | POA: Diagnosis not present

## 2018-11-04 DIAGNOSIS — R0600 Dyspnea, unspecified: Secondary | ICD-10-CM

## 2018-11-04 NOTE — Progress Notes (Signed)
Patient referred by Shon Baton, MD for shortness of breath  Subjective:   Tabitha Moore, female    DOB: Nov 18, 1940, 78 y.o.   MRN: [id number]   Chief Complaint  Patient presents with  . Shortness of Breath  . New Patient (Initial Visit)    HPI  78 y.o. African American female with hypertension, type2 DM, hypothyroidism, now with shortness of breath and dizziness.   Patient lives alone. She has noticed shortness of breath with minimal exertion. She denies any chest pain. She has noticed constant dizziness. On a separate note, she has also noticed pain in her throat while swallowing.    Past Medical History:  Diagnosis Date  . Arthritis   . Complication of anesthesia    pt states "hard to wake up"  . Diabetes mellitus   . Hemorrhoids   . Hyperlipidemia   . Hypertension   . Hypothyroidism   . Retinal cyst      Past Surgical History:  Procedure Laterality Date  . ABDOMINAL HYSTERECTOMY    . BREAST LUMPECTOMY WITH RADIOACTIVE SEED LOCALIZATION Left 08/27/2014   Procedure: BREAST LUMPECTOMY WITH RADIOACTIVE SEED LOCALIZATION;  Surgeon: Autumn Messing III, MD;  Location: [facility];  Service: General;  Laterality: Left;  . CATARACT EXTRACTION    . CHOLECYSTECTOMY       Social History   Socioeconomic History  . Marital status: Widowed    Spouse name: Not on file  . Number of children: Not on file  . Years of education: Not on file  . Highest education level: Not on file  Occupational History  . Not on file  Social Needs  . Financial resource strain: Not on file  . Food insecurity    Worry: Not on file    Inability: Not on file  . Transportation needs    Medical: Not on file    Non-medical: Not on file  Tobacco Use  . Smoking status: Never Smoker  . Smokeless tobacco: Never Used  Substance and Sexual Activity  . Alcohol use: No  . Drug use: Never  . Sexual activity: Not on file  Lifestyle  . Physical activity    Days per week: Not on file     Minutes per session: Not on file  . Stress: Not on file  Relationships  . Social Herbalist on phone: Not on file    Gets together: Not on file    Attends religious service: Not on file    Active member of club or organization: Not on file    Attends meetings of clubs or organizations: Not on file    Relationship status: Not on file  . Intimate partner violence    Fear of current or ex partner: Not on file    Emotionally abused: Not on file    Physically abused: Not on file    Forced sexual activity: Not on file  Other Topics Concern  . Not on file  Social History Narrative  . Not on file     Family History  Problem Relation Age of Onset  . Cancer Father        lung  . Cancer Sister        breast     Current Outpatient Medications on File Prior to Visit  Medication Sig Dispense Refill  . BENICAR 40 MG tablet daily.    . Cholecalciferol (VITAMIN D PO) Take 1,000 mg by mouth daily.     Marland Kitchen  CRESTOR 5 MG tablet 5 mg at bedtime.     Marland Kitchen FOLIC ACID PO Take by mouth.    . Ginger, Zingiber officinalis, (GINGER EXTRACT PO) Take by mouth.    Marland Kitchen glimepiride (AMARYL) 4 MG tablet BID times 48H.    Marland Kitchen levothyroxine (SYNTHROID, LEVOTHROID) 88 MCG tablet daily.    . metFORMIN (GLUCOPHAGE) 500 MG tablet     . metFORMIN (GLUCOPHAGE-XR) 500 MG 24 hr tablet Take 500 mg by mouth daily with breakfast.    . methotrexate (RHEUMATREX) 2.5 MG tablet Take by mouth.    . Misc Natural Products (TART CHERRY ADVANCED PO) Take by mouth.    . Omega 3 1000 MG CAPS Take by mouth.    Marland Kitchen omeprazole (PRILOSEC) 40 MG capsule     . ONE TOUCH ULTRA TEST test strip CHECK BLOOD SUGAR DAILY. DX CODE-E11.9  3  . Turmeric 400 MG CAPS Take by mouth.    . valACYclovir (VALTREX) 1000 MG tablet Take 1,000 mg by mouth 3 (three) times daily.  5   No current facility-administered medications on file prior to visit.     Cardiovascular studies:  EKG 11/04/2018: Sinus rhythm 72 bpm. Normal EKG.  Recent  labs: 10/27/2018: Glucose 157. BUN/Cr 10/1.1. eGFR 57. Na/K 138/5.3.  H/H 12/40. MCV 93. Platelets 318.    Review of Systems  Constitution: Negative for decreased appetite, malaise/fatigue, weight gain and weight loss.  HENT: Positive for congestion and odynophagia.   Eyes: Negative for visual disturbance.  Cardiovascular: Positive for dyspnea on exertion. Negative for chest pain, leg swelling, palpitations and syncope.  Respiratory: Positive for shortness of breath. Negative for cough.   Endocrine: Negative for cold intolerance.  Hematologic/Lymphatic: Does not bruise/bleed easily.  Skin: Negative for itching and rash.  Musculoskeletal: Negative for myalgias.  Gastrointestinal: Negative for abdominal pain, nausea and vomiting.  Genitourinary: Negative for dysuria.  Neurological: Negative for dizziness and weakness.  Psychiatric/Behavioral: The patient is not nervous/anxious.   All other systems reviewed and are negative.        Vitals:   11/04/18 1528 11/04/18 1529  SpO2: 96% 96%    11/04/18 3:28 PM      BP 167/88  163/85   BP Location RightArm  RightArm   Patient Position Sitting  Standing   Cuff Size Normal  Normal   Pulse 71  73   SpO2 96%  96%     Body mass index is 35.79 kg/m. Filed Weights   11/04/18 1452  Weight: 195 lb 11.2 oz (88.8 kg)    Objective:   Physical Exam  Constitutional: She is oriented to person, place, and time. She appears well-developed and well-nourished. No distress.  HENT:  Head: Normocephalic and atraumatic.  Eyes: Pupils are equal, round, and reactive to light. Conjunctivae are normal.  Neck: No JVD present.  Cardiovascular: Normal rate, regular rhythm and intact distal pulses.  Pulmonary/Chest: Effort normal and breath sounds normal. She has no wheezes. She has no rales.  Abdominal: Soft. Bowel sounds are normal. There is no rebound.  Musculoskeletal:        General: No edema.  Lymphadenopathy:    She has no  cervical adenopathy.  Neurological: She is alert and oriented to person, place, and time. No cranial nerve deficit.  Skin: Skin is warm and dry.  Psychiatric: She has a normal mood and affect.  Nursing note and vitals reviewed.      Assessment & Recommendations:   78 y.o. African American female with hypertension,  type2 DM, hypothyroidism, now with shortness of breath and dizziness.   1. Exertional dyspnea Risk factors for CAD. No heart failure on exam. Cannot exclude angina equivalent. Normal resting EKG but unable to exercise due to dyspnea. Will obtain echocardiogram, lexiscan nuclear stress test.   2. Dizziness I suspect ENT etiology for her symptoms. Will obtain carotid US.  3. Hypertension Uncontrolled. She is yet to start amlodipine 2.5 mg daily. Increase to 5 mg daily as tolerated.   Thank you for referring the patient to [place]. Please feel free to contact with any questions.  Nigel Mormon, MD [facility] Pager: [phone] Office: [phone] If no answer Cell [phone]

## 2018-11-07 ENCOUNTER — Telehealth: Payer: Self-pay

## 2018-11-07 ENCOUNTER — Other Ambulatory Visit: Payer: Self-pay

## 2018-11-07 ENCOUNTER — Ambulatory Visit (INDEPENDENT_AMBULATORY_CARE_PROVIDER_SITE_OTHER): Payer: Medicare Other

## 2018-11-07 ENCOUNTER — Ambulatory Visit: Payer: Medicare Other

## 2018-11-07 DIAGNOSIS — R42 Dizziness and giddiness: Secondary | ICD-10-CM | POA: Diagnosis not present

## 2018-11-07 DIAGNOSIS — H3581 Retinal edema: Secondary | ICD-10-CM | POA: Diagnosis not present

## 2018-11-07 DIAGNOSIS — H30033 Focal chorioretinal inflammation, peripheral, bilateral: Secondary | ICD-10-CM | POA: Diagnosis not present

## 2018-11-07 DIAGNOSIS — Z961 Presence of intraocular lens: Secondary | ICD-10-CM | POA: Diagnosis not present

## 2018-11-07 DIAGNOSIS — Z79899 Other long term (current) drug therapy: Secondary | ICD-10-CM | POA: Diagnosis not present

## 2018-11-07 DIAGNOSIS — E113313 Type 2 diabetes mellitus with moderate nonproliferative diabetic retinopathy with macular edema, bilateral: Secondary | ICD-10-CM | POA: Diagnosis not present

## 2018-11-07 DIAGNOSIS — R0609 Other forms of dyspnea: Secondary | ICD-10-CM

## 2018-11-07 DIAGNOSIS — R0600 Dyspnea, unspecified: Secondary | ICD-10-CM

## 2018-11-07 DIAGNOSIS — H26492 Other secondary cataract, left eye: Secondary | ICD-10-CM | POA: Diagnosis not present

## 2018-11-07 DIAGNOSIS — Z794 Long term (current) use of insulin: Secondary | ICD-10-CM | POA: Diagnosis not present

## 2018-11-07 NOTE — Telephone Encounter (Signed)
Yes

## 2018-11-07 NOTE — Telephone Encounter (Signed)
Telephone encounter:  Reason for call: pt was in office today for testing and asked if ok to take 1/2 tab of metoprolol due to whole tab causing fatigue.  Usual provider: MP  Last office visit: 7/28  Next office visit: 8/26   Last hospitalization:    Current Outpatient Medications on File Prior to Visit  Medication Sig Dispense Refill  . amLODipine (NORVASC) 2.5 MG tablet Take 2.5 mg by mouth daily.    . Bacillus Coagulans-Inulin (PROBIOTIC FORMULA PO) Take by mouth daily.    Marland Kitchen BENICAR 40 MG tablet daily.    . Cholecalciferol (VITAMIN D PO) Take 1,000 mg by mouth daily.     . CRESTOR 5 MG tablet 5 mg at bedtime.     Marland Kitchen FOLIC ACID PO Take by mouth.    . Ginger, Zingiber officinalis, (GINGER EXTRACT PO) Take by mouth.    Marland Kitchen glimepiride (AMARYL) 4 MG tablet BID times 48H.    Marland Kitchen levothyroxine (SYNTHROID, LEVOTHROID) 88 MCG tablet daily.    . metFORMIN (GLUCOPHAGE) 500 MG tablet     . methotrexate (RHEUMATREX) 2.5 MG tablet Take by mouth.    . metoprolol succinate (TOPROL-XL) 25 MG 24 hr tablet Take 1 tablet by mouth daily.    . Misc Natural Products (TART CHERRY ADVANCED PO) Take by mouth.    . Omega 3 1000 MG CAPS Take by mouth.    Marland Kitchen omeprazole (PRILOSEC) 40 MG capsule     . ONE TOUCH ULTRA TEST test strip CHECK BLOOD SUGAR DAILY. DX CODE-E11.9  3  . Turmeric 400 MG CAPS Take by mouth.    . valACYclovir (VALTREX) 1000 MG tablet Take 1,000 mg by mouth 3 (three) times daily.  5   No current facility-administered medications on file prior to visit.

## 2018-11-07 NOTE — Telephone Encounter (Signed)
Pt aware.//ah

## 2018-11-09 ENCOUNTER — Emergency Department (HOSPITAL_COMMUNITY)
Admission: EM | Admit: 2018-11-09 | Discharge: 2018-11-09 | Disposition: A | Discharge status: Home / Self Care | Payer: Medicare Other | Attending: Emergency Medicine | Admitting: Emergency Medicine

## 2018-11-09 ENCOUNTER — Other Ambulatory Visit: Payer: Self-pay

## 2018-11-09 ENCOUNTER — Encounter (HOSPITAL_COMMUNITY): Payer: Self-pay | Admitting: Emergency Medicine

## 2018-11-09 ENCOUNTER — Emergency Department (HOSPITAL_COMMUNITY): Payer: Medicare Other

## 2018-11-09 DIAGNOSIS — G44219 Episodic tension-type headache, not intractable: Secondary | ICD-10-CM | POA: Insufficient documentation

## 2018-11-09 DIAGNOSIS — E039 Hypothyroidism, unspecified: Secondary | ICD-10-CM | POA: Insufficient documentation

## 2018-11-09 DIAGNOSIS — R609 Edema, unspecified: Secondary | ICD-10-CM | POA: Diagnosis not present

## 2018-11-09 DIAGNOSIS — I10 Essential (primary) hypertension: Secondary | ICD-10-CM | POA: Diagnosis not present

## 2018-11-09 DIAGNOSIS — Z79899 Other long term (current) drug therapy: Secondary | ICD-10-CM | POA: Insufficient documentation

## 2018-11-09 DIAGNOSIS — R002 Palpitations: Secondary | ICD-10-CM | POA: Diagnosis not present

## 2018-11-09 DIAGNOSIS — R0602 Shortness of breath: Secondary | ICD-10-CM | POA: Diagnosis not present

## 2018-11-09 DIAGNOSIS — R0789 Other chest pain: Secondary | ICD-10-CM | POA: Diagnosis present

## 2018-11-09 DIAGNOSIS — Z7984 Long term (current) use of oral hypoglycemic drugs: Secondary | ICD-10-CM | POA: Insufficient documentation

## 2018-11-09 DIAGNOSIS — E119 Type 2 diabetes mellitus without complications: Secondary | ICD-10-CM | POA: Insufficient documentation

## 2018-11-09 DIAGNOSIS — R000 Tachycardia, unspecified: Secondary | ICD-10-CM | POA: Diagnosis not present

## 2018-11-09 LAB — BASIC METABOLIC PANEL
Anion gap: 15 (ref 5–15)
BUN: 14 mg/dL (ref 8–23)
CO2: 20 mmol/L — ABNORMAL LOW (ref 22–32)
Calcium: 9.1 mg/dL (ref 8.9–10.3)
Chloride: 94 mmol/L — ABNORMAL LOW (ref 98–111)
Creatinine, Ser: 1.07 mg/dL — ABNORMAL HIGH (ref 0.44–1.00)
GFR calc Af Amer: 57 mL/min — ABNORMAL LOW (ref >60)
GFR calc non Af Amer: 49 mL/min — ABNORMAL LOW (ref >60)
Glucose, Bld: 96 mg/dL (ref 70–99)
Potassium: 3.7 mmol/L (ref 3.5–5.1)
Sodium: 129 mmol/L — ABNORMAL LOW (ref 135–145)

## 2018-11-09 LAB — URINALYSIS, ROUTINE W REFLEX MICROSCOPIC
Bacteria, UA: NONE SEEN
Bilirubin Urine: NEGATIVE
Glucose, UA: NEGATIVE mg/dL
Ketones, ur: NEGATIVE mg/dL
Leukocytes,Ua: NEGATIVE
Nitrite: NEGATIVE
Protein, ur: NEGATIVE mg/dL
Specific Gravity, Urine: 1.002 — ABNORMAL LOW (ref 1.005–1.030)
pH: 6 (ref 5.0–8.0)

## 2018-11-09 LAB — CBC
HCT: 36.9 % (ref 36.0–46.0)
Hemoglobin: 12.1 g/dL (ref 12.0–15.0)
MCH: 29.2 pg (ref 26.0–34.0)
MCHC: 32.8 g/dL (ref 30.0–36.0)
MCV: 88.9 fL (ref 80.0–100.0)
Platelets: 334 10*3/uL (ref 150–400)
RBC: 4.15 MIL/uL (ref 3.87–5.11)
RDW: 15.1 % (ref 11.5–15.5)
WBC: 7.3 10*3/uL (ref 4.0–10.5)
nRBC: 0 % (ref 0.0–0.2)

## 2018-11-09 LAB — TROPONIN I (HIGH SENSITIVITY)
Troponin I (High Sensitivity): 5 ng/L
Troponin I (High Sensitivity): 5 ng/L

## 2018-11-09 MED ORDER — SODIUM CHLORIDE 0.9% FLUSH: 3.0000 mL | Freq: Once | INTRAVENOUS | Status: DC

## 2018-11-09 MED ORDER — ACETAMINOPHEN 325 MG PO TABS
650.0000 mg | ORAL_TABLET | Freq: Once | ORAL | Status: AC
  Administered 2018-11-09: 650 mg via ORAL
  Filled 2018-11-09: qty 2

## 2018-11-09 NOTE — ED Triage Notes (Signed)
Arrived via EMS from home. Patient states feeling heart racing, heart racing, and has high blood pressure. Took a half a tab of Metoprolol however states it didn't help. Currently being treated for a sinus infection and states feels like she is going to pass out. Alert answering and following commands appropriate.

## 2018-11-09 NOTE — ED Provider Notes (Signed)
[facility] EMERGENCY DEPARTMENT Provider Note   CSN: [id number] Arrival date & time: 11/09/18  1839    History   Chief Complaint Chief Complaint  Patient presents with   Hypertension   Headache   Nasal Congestion   Near Syncope    HPI Tabitha Moore is a 78 y.o. female.  HPI: A 78 year old patient with a history of treated diabetes, hypertension, hypercholesterolemia and obesity presents for evaluation of chest pain. Initial onset of pain was more than 6 hours ago. The patient's chest pain is described as heaviness/pressure/tightness and is not worse with exertion. The patient's chest pain is not middle- or left-sided, is not well-localized, is not sharp and does not radiate to the arms/jaw/neck. The patient does not complain of nausea and denies diaphoresis. The patient has no history of stroke, has no history of peripheral artery disease, has not smoked in the past 90 days and has no relevant family history of coronary artery disease (first degree relative at less than age 21).   HPI  Patient presents via EMS from home for evaluation of several hours of palpitations, feeling general malaise, pressure in her chest.  She reports that she has felt generally unwell over the last month to month and a half.  Started with sensation of fullness in her sinuses.  She was evaluated by PCP and given antibiotics and nasal spray for presumed sinus infection.  Also evaluated for hypertension and prescribed metoprolol and amlodipine.  She felt that the metoprolol was making her feel unwell so she has been taking it in half doses.  Reports today that she was having normal day but then was watching television and got up to stand while watching and developed palpitations.  No presyncope.  Mild shortness of breath.  Symptoms have been fairly constant since but have resolved somewhat.  Also endorses frontal headache with pressure feeling.  Reports that this is the headache that she has  had intermittently over last month and a half.  Past Medical History:  Diagnosis Date   Arthritis    Complication of anesthesia    pt states "hard to wake up"   Diabetes mellitus    Hemorrhoids    Hyperlipidemia    Hypertension    Hypothyroidism    Retinal cyst     Patient Active Problem List   Diagnosis Date Noted   Exertional dyspnea 11/04/2018   Dizziness 11/04/2018   Hallux rigidus, bilateral 05/05/2018   High risk medication use 11/17/2016   DDD (degenerative disc disease), lumbar 11/17/2016   Primary osteoarthritis of both knees 11/13/2016   Pain in left ankle and joints of left foot 11/13/2016   Essential hypertension 10/18/2016   Left ventricular hypertrophy 10/18/2016   PVC's (premature ventricular contractions) 10/18/2016   History of diabetes mellitus 10/18/2016   History of hypothyroidism 10/18/2016   Dyslipidemia 10/18/2016   Obesity (BMI 35.0-39.9 without comorbidity) 10/18/2016   History of GI diverticular bleed 10/18/2016   Stage 4 chronic kidney disease ([place]) 10/18/2016   Chorioretinitis of both eyes 10/18/2016   Internal and external prolapsed hemorrhoids, bleeding. 10/05/2011    Past Surgical History:  Procedure Laterality Date   ABDOMINAL HYSTERECTOMY     BREAST LUMPECTOMY WITH RADIOACTIVE SEED LOCALIZATION Left 08/27/2014   Procedure: BREAST LUMPECTOMY WITH RADIOACTIVE SEED LOCALIZATION;  Surgeon: Autumn Messing III, MD;  Location: [facility];  Service: General;  Laterality: Left;   CATARACT EXTRACTION     CHOLECYSTECTOMY  OB History   No obstetric history on file.      Home Medications    Prior to Admission medications   Medication Sig Start Date End Date Taking? Authorizing Provider  acetaminophen (TYLENOL) 325 MG tablet Take 650 mg by mouth every 6 (six) hours as needed for mild pain.   Yes [provider]  acetaminophen (TYLENOL) 500 MG tablet Take 1,000 mg by mouth every 6 (six)  hours as needed for mild pain.   Yes [provider]  amLODipine (NORVASC) 2.5 MG tablet Take 2.5 mg by mouth daily.   Yes [provider]  Bacillus Coagulans-Inulin (PROBIOTIC FORMULA PO) Take 1 tablet by mouth daily.    Yes [provider]  BENICAR 40 MG tablet Take 40 mg by mouth daily.  09/12/11  Yes [provider]  Cholecalciferol (VITAMIN D PO) Take 1,000 mg by mouth daily.    Yes [provider]  CRESTOR 5 MG tablet Take 5 mg by mouth every morning.  09/21/11  Yes [provider]  fluticasone (FLONASE) 50 MCG/ACT nasal spray Place 1 spray into both nostrils every other day.   Yes [provider]  Ginger, Zingiber officinalis, (GINGER EXTRACT PO) Take 1 tablet by mouth daily.    Yes [provider]  glimepiride (AMARYL) 4 MG tablet Take 4 mg by mouth 2 (two) times a day.  07/06/11  Yes [provider]  levothyroxine (SYNTHROID, LEVOTHROID) 88 MCG tablet Take 88 mcg by mouth daily.  10/01/11  Yes [provider]  metFORMIN (GLUCOPHAGE) 500 MG tablet Take 500 mg by mouth every morning.  03/03/18  Yes [provider]  metoprolol succinate (TOPROL-XL) 25 MG 24 hr tablet Take 12.5 mg by mouth daily.  10/27/18  Yes [provider]  Misc Natural Products (TART CHERRY ADVANCED PO) Take 2 tablets by mouth daily.    Yes [provider]  omega-3 acid ethyl esters (LOVAZA) 1 g capsule Take 1 g by mouth daily.   Yes [provider]  omeprazole (PRILOSEC) 40 MG capsule Take 40 mg by mouth daily.  02/03/18  Yes [provider]  psyllium (HYDROCIL/METAMUCIL) 95 % PACK Take 1 packet by mouth daily as needed for mild constipation.   Yes [provider]  Turmeric 400 MG CAPS Take 1 capsule by mouth daily.    Yes [provider]    Family History Family History  Problem Relation Age of Onset   Cancer Father        lung   Cancer Sister        breast     Social History Social History   Tobacco Use   Smoking status: Never Smoker   Smokeless tobacco: Never Used  Substance Use Topics   Alcohol use: No   Drug use: Never     Allergies   Shellfish allergy   Review of Systems Review of Systems  HENT: Positive for sinus pressure.   Respiratory: Positive for chest tightness and shortness of breath.   Cardiovascular: Positive for palpitations.  All other systems reviewed and are negative.    Physical Exam Updated Vital Signs BP (!) 157/69    Pulse 60    Temp 98.5 F (36.9 C) (Oral)    Resp 14    Ht 5\' 3"  (1.6 m)    Wt 85 kg    SpO2 96%    BMI 33.19 kg/m   Physical Exam Vitals signs and nursing note reviewed.  Constitutional:  General: She is not in acute distress.    Appearance: She is well-developed.  HENT:     Head: Normocephalic and atraumatic.     Mouth/Throat:     Mouth: Mucous membranes are moist.  Eyes:     Conjunctiva/sclera: Conjunctivae normal.     Pupils: Pupils are equal, round, and reactive to light.  Neck:     Musculoskeletal: Neck supple.  Cardiovascular:     Rate and Rhythm: Normal rate and regular rhythm.     Heart sounds: No murmur.  Pulmonary:     Effort: Pulmonary effort is normal. No respiratory distress.     Breath sounds: Normal breath sounds.  Abdominal:     Palpations: Abdomen is soft.     Tenderness: There is no abdominal tenderness.  Skin:    General: Skin is warm and dry.  Neurological:     Mental Status: She is alert.  Psychiatric:        Mood and Affect: Mood normal.      ED Treatments / Results  Labs (all labs ordered are listed, but only abnormal results are displayed) Labs Reviewed  BASIC METABOLIC PANEL - Abnormal; Notable for the following components:      Result Value   Sodium 129 (*)    Chloride 94 (*)    CO2 20 (*)    Creatinine, Ser 1.07 (*)    GFR calc non Af Amer 49 (*)    GFR calc Af Amer 57 (*)    All other components within normal limits   URINALYSIS, ROUTINE W REFLEX MICROSCOPIC - Abnormal; Notable for the following components:   Color, Urine COLORLESS (*)    Specific Gravity, Urine 1.002 (*)    Hgb urine dipstick SMALL (*)    All other components within normal limits  CBC  TROPONIN I (HIGH SENSITIVITY)  TROPONIN I (HIGH SENSITIVITY)  TROPONIN I (HIGH SENSITIVITY)  TROPONIN I (HIGH SENSITIVITY)    EKG EKG Interpretation  Date/Time:  Sunday November 09 2018 18:50:56 EDT Ventricular Rate:  81 PR Interval:  152 QRS Duration: 82 QT Interval:  368 QTC Calculation: 427 R Axis:   35 Text Interpretation:  Normal sinus rhythm Right atrial enlargement Borderline ECG no significant change since May 2016 Confirmed by Sherwood Gambler [phone]) on 11/09/2018 7:50:22 PM   Radiology Dg Chest Portable 1 View  Result Date: 11/09/2018 CLINICAL DATA:  Shortness of breath with chest pressure and cardiac palpitations EXAM: PORTABLE CHEST 1 VIEW COMPARISON:  None. FINDINGS: There is slight atelectasis in the left base. Lungs elsewhere are clear. Heart size and pulmonary vascularity are normal. No adenopathy. No pneumothorax. No bone lesions. IMPRESSION: Mild left base atelectasis. Lungs elsewhere clear. Cardiac silhouette within normal limits. Electronically Signed   By: Lowella Grip III M.D.   On: 11/09/2018 21:03    Procedures Procedures (including critical care time)  Medications Ordered in ED Medications  sodium chloride flush (NS) 0.9 % injection 3 mL (has no administration in time range)  acetaminophen (TYLENOL) tablet 650 mg (650 mg Oral Given 11/09/18 2050)     Initial Impression / Assessment and Plan / ED Course  I have reviewed the triage vital signs and the nursing notes.  Pertinent labs & imaging results that were available during my care of the patient were reviewed by me and considered in my medical decision making (see chart for details).     HEAR Score: 4  Tabitha Moore is an 78 year old female with a history of  essential  hypertension, diabetes mellitus, non-insulin-dependent, history of GI diverticular bleed, hypothyroidism, obesity, stage IV CKD.  She presents today for episode of palpitations as above.  He is on metoprolol and amlodipine.  Well-appearing on exam with normal vitals.  She has had normal rhythm with ECG as above.  Hear score of 4.  Troponins at 0 and 2 hours are both within normal limits.  Doubt ACS.  Chest x-ray is unremarkable with the exception of mild left base atelectasis.  I do not think that overall this picture is consistent with pneumonia.  Other labs are also unremarkable/within norm limits except for mild hyponatremia but does not require emergent treatment at this point.  Given that she has been so well-appearing and stable through several hours of observation with negative work-up as above, think she is okay for discharge.  Regarding her complaint of headache, she recently had MRI for dizziness and similar symptoms which was negative.  Instructed her to continue taking her metoprolol as she is not bradycardic from it and I doubt that this is the cause of her symptoms earlier.  Overall I am unclear what the cause is, but is not appear to be a proggressive or life-threatening process.  Okay for discharge.   Final Clinical Impressions(s) / ED Diagnoses   Final diagnoses:  Essential hypertension  Episodic tension-type headache, not intractable  Palpitations    ED Discharge Orders    None       Tillie Fantasia, MD 11/09/18 2256    Sherwood Gambler, MD 11/09/18 2340

## 2018-11-12 NOTE — Progress Notes (Signed)
Pt aware of results.//ah

## 2018-11-13 NOTE — Progress Notes (Deleted)
Office Visit Note  Patient: Tabitha Moore             Date of Birth: [birth year]           MRN: [id number]             PCP: Shon Baton, MD Referring: Shon Baton, MD Visit Date: 11/27/2018 Occupation: @GUAROCC @  Subjective:  No chief complaint on file.   History of Present Illness: Tabitha Moore is a 78 y.o. female ***   Activities of Daily Living:  Patient reports morning stiffness for *** {minute/hour:19697}.   Patient {ACTIONS;DENIES/REPORTS:21021675::"Denies"} nocturnal pain.  Difficulty dressing/grooming: {ACTIONS;DENIES/REPORTS:21021675::"Denies"} Difficulty climbing stairs: {ACTIONS;DENIES/REPORTS:21021675::"Denies"} Difficulty getting out of chair: {ACTIONS;DENIES/REPORTS:21021675::"Denies"} Difficulty using hands for taps, buttons, cutlery, and/or writing: {ACTIONS;DENIES/REPORTS:21021675::"Denies"}  No Rheumatology ROS completed.   PMFS History:  Patient Active Problem List   Diagnosis Date Noted  . Exertional dyspnea 11/04/2018  . Dizziness 11/04/2018  . Hallux rigidus, bilateral 05/05/2018  . High risk medication use 11/17/2016  . DDD (degenerative disc disease), lumbar 11/17/2016  . Primary osteoarthritis of both knees 11/13/2016  . Pain in left ankle and joints of left foot 11/13/2016  . Essential hypertension 10/18/2016  . Left ventricular hypertrophy 10/18/2016  . PVC's (premature ventricular contractions) 10/18/2016  . History of diabetes mellitus 10/18/2016  . History of hypothyroidism 10/18/2016  . Dyslipidemia 10/18/2016  . Obesity (BMI 35.0-39.9 without comorbidity) 10/18/2016  . History of GI diverticular bleed 10/18/2016  . Stage 4 chronic kidney disease ([place]) 10/18/2016  . 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 Internal and external prolapsed hemorrhoids, bleeding. 10/05/2011    Past Medical History:  Diagnosis Date  . Arthritis   . Complication of anesthesia    pt states "hard to wake up"  . Diabetes mellitus   . Hemorrhoids    . Hyperlipidemia   . Hypertension   . Hypothyroidism   . Retinal cyst     Family History  Problem Relation Age of Onset  . Cancer Father        lung  . Cancer Sister        breast   Past Surgical History:  Procedure Laterality Date  . ABDOMINAL HYSTERECTOMY    . BREAST LUMPECTOMY WITH RADIOACTIVE SEED LOCALIZATION Left 08/27/2014   Procedure: BREAST LUMPECTOMY WITH RADIOACTIVE SEED LOCALIZATION;  Surgeon: Autumn Messing III, MD;  Location: [facility];  Service: General;  Laterality: Left;  . CATARACT EXTRACTION    . CHOLECYSTECTOMY     Social History   Social History Narrative  . Not on file   Immunization History  Administered Date(s) Administered  . Tdap 03/09/2018     Objective: Vital Signs: There were no vitals taken for this visit.   Physical Exam   Musculoskeletal Exam: ***  CDAI Exam: CDAI Score: - Patient Global: -; Provider Global: - Swollen: -; Tender: - Joint Exam   No joint exam has been documented for this visit   There is currently no information documented on the homunculus. Go to the Rheumatology activity and complete the homunculus joint exam.  Investigation: No additional findings.  Imaging: Dg Cervical Spine 2 Or 3 Views  Result Date: 10/27/2018 CLINICAL DATA:  Neck pain and bilateral mastoid pain with dizziness 2 months. EXAM: CERVICAL SPINE - 2-3 VIEW COMPARISON:  None. FINDINGS: Vertebral body alignment and heights are normal. There is moderate spondylosis of the mid to lower cervical spine. There is minimal disc space narrowing at the C5-6 and C6-7 levels. Prevertebral soft  tissues are normal. There is mild uncovertebral joint spurring present. Atlantoaxial articulation is normal. IMPRESSION: No acute findings. Mild-to-moderate spondylosis of the mid to lower cervical spine with disc disease at the C5-6 and C6-7 levels. Electronically Signed   By: Marin Olp M.D.   On: 10/27/2018 14:53   Mr Brain Wo Contrast  Result Date:  10/31/2018 CLINICAL DATA:  Dizziness, balance disorder. Additional history: Dizziness, stuffy feeling in head, recent sinus infection EXAM: MRI HEAD WITHOUT CONTRAST TECHNIQUE: Multiplanar, multiecho pulse sequences of the brain and surrounding structures were obtained without intravenous contrast. COMPARISON:  No prior imaging of the brain available for comparison. FINDINGS: Brain: Mild-to-moderate motion degradation of multiple sequences. No restricted diffusion is demonstrated to suggest acute or recent subacute infarction. No intracranial hemorrhage, midline shift or extra-axial collection. No evidence of intracranial mass. Mild generalized parenchymal atrophy. Mild scattered and ill-defined T2/FLAIR hyperintensity within the cerebral white matter is nonspecific, but consistent with chronic small vessel ischemic disease. SWI signal loss within the bilateral basal ganglia and dentate nuclei consistent with mineralization. Partially empty sella turcica. Vascular: Flow voids within the proximal large vessels. Skull and upper cervical spine: Normal marrow signal. Incompletely assessed upper cervical spondylosis. Sinuses/Orbits: The globes and orbits demonstrate no acute abnormality. Mild bilateral frontal and maxillary sinus mucosal thickening. No significant mastoid effusion. IMPRESSION: Motion degraded exam. No evidence of intracranial mass or acute intracranial abnormality on this non-contrast brain MRI. Mild generalized parenchymal atrophy and chronic small vessel ischemic disease. Electronically Signed   By: Kellie Simmering   On: 10/31/2018 10:37   Dg Chest Portable 1 View  Result Date: 11/09/2018 CLINICAL DATA:  Shortness of breath with chest pressure and cardiac palpitations EXAM: PORTABLE CHEST 1 VIEW COMPARISON:  None. FINDINGS: There is slight atelectasis in the left base. Lungs elsewhere are clear. Heart size and pulmonary vascularity are normal. No adenopathy. No pneumothorax. No bone lesions.  IMPRESSION: Mild left base atelectasis. Lungs elsewhere clear. Cardiac silhouette within normal limits. Electronically Signed   By: Lowella Grip III M.D.   On: 11/09/2018 21:03   Pcv Carotid Duplex (bilateral)  Result Date: 11/11/2018 Carotid artery duplex  11/07/2018: Stenosis in the bilateral  internal carotid artery (1-15%). Mild heterogeneous plaque noted. Antegrade right vertebral artery flow. Antegrade left vertebral artery flow.   Recent Labs: Lab Results  Component Value Date   WBC 7.3 11/09/2018   HGB 12.1 11/09/2018   PLT 334 11/09/2018   NA 129 (L) 11/09/2018   K 3.7 11/09/2018   CL 94 (L) 11/09/2018   CO2 20 (L) 11/09/2018   GLUCOSE 96 11/09/2018   BUN 14 11/09/2018   CREATININE 1.07 (H) 11/09/2018   CALCIUM 9.1 11/09/2018   GFRAA 57 (L) 11/09/2018    Speciality Comments: No specialty comments available.  Procedures:  No procedures performed Allergies: Shellfish allergy   Assessment / Plan:     Visit Diagnoses: No diagnosis found.  Orders: No orders of the defined types were placed in this encounter.  No orders of the defined types were placed in this encounter.   Face-to-face time spent with patient was *** minutes. Greater than 50% of time was spent in counseling and coordination of care.  Follow-Up Instructions: No follow-ups on file.   Ofilia Neas, PA-C  Note - This record has been created using Dragon software.  Chart creation errors have been sought, but may not always  have been located. Such creation errors do not reflect on  the standard of medical care.

## 2018-11-14 ENCOUNTER — Other Ambulatory Visit: Payer: Medicare Other

## 2018-11-17 DIAGNOSIS — N183 Chronic kidney disease, stage 3 (moderate): Secondary | ICD-10-CM | POA: Diagnosis not present

## 2018-11-17 DIAGNOSIS — R42 Dizziness and giddiness: Secondary | ICD-10-CM | POA: Diagnosis not present

## 2018-11-17 DIAGNOSIS — I129 Hypertensive chronic kidney disease with stage 1 through stage 4 chronic kidney disease, or unspecified chronic kidney disease: Secondary | ICD-10-CM | POA: Diagnosis not present

## 2018-11-20 ENCOUNTER — Other Ambulatory Visit: Payer: Medicare Other

## 2018-11-20 DIAGNOSIS — R42 Dizziness and giddiness: Secondary | ICD-10-CM | POA: Diagnosis not present

## 2018-11-20 DIAGNOSIS — H903 Sensorineural hearing loss, bilateral: Secondary | ICD-10-CM | POA: Diagnosis not present

## 2018-11-25 DIAGNOSIS — E7849 Other hyperlipidemia: Secondary | ICD-10-CM | POA: Diagnosis not present

## 2018-11-25 DIAGNOSIS — Z23 Encounter for immunization: Secondary | ICD-10-CM | POA: Diagnosis not present

## 2018-11-26 DIAGNOSIS — E871 Hypo-osmolality and hyponatremia: Secondary | ICD-10-CM | POA: Diagnosis not present

## 2018-11-26 DIAGNOSIS — R000 Tachycardia, unspecified: Secondary | ICD-10-CM | POA: Diagnosis not present

## 2018-11-26 NOTE — Progress Notes (Signed)
Office Visit Note  Patient: Tabitha Moore             Date of Birth: 1941-02-28           MRN: [id number]             PCP: Shon Baton, MD Referring: Shon Baton, MD Visit Date: 12/10/2018 Occupation: _0 @  Subjective:  Pain in lower back    History of Present Illness: Tabitha Moore is a 78 y.o. female with history of chorioretinitis and osteoarthritis.  She was taking Methotrexate 10 tablets by mouth once weekly and folic acid 2 mg po daily but discontinued in July 2020 due to recurrent mouth sores.  She denies any recent flares.  She was evaluated by Dr. Manuella Ghazi 1 month ago.  She reports she has not been started on any new medications.   She reports lower back pain if she walks for prolonged distances.  She experiences pain in both great toes.  She denies any joint swelling.  She takes tylenol for pain relief.   Activities of Daily Living:  Patient reports morning stiffness for 45 minutes.   Patient Denies nocturnal pain.  Difficulty dressing/grooming: Denies Difficulty climbing stairs: Reports Difficulty getting out of chair: Reports Difficulty using hands for taps, buttons, cutlery, and/or writing: Denies  Review of Systems  Constitutional: Positive for fatigue.  HENT: Negative for mouth sores, mouth dryness and nose dryness.   Eyes: Positive for dryness ([place]). Negative for pain and visual disturbance.  Respiratory: Negative for cough, hemoptysis, shortness of breath and difficulty breathing.   Cardiovascular: Negative for chest pain, palpitations, hypertension and swelling in legs/feet.  Gastrointestinal: Positive for constipation. Negative for blood in stool and diarrhea.  Endocrine: Negative for increased urination.  Genitourinary: Negative for painful urination.  Musculoskeletal: Positive for arthralgias, joint pain and morning stiffness. Negative for joint swelling, myalgias, muscle weakness, muscle tenderness and myalgias.  Skin: Negative for color change,  pallor, rash, hair loss, nodules/bumps, skin tightness, ulcers and sensitivity to sunlight.  Allergic/Immunologic: Negative for susceptible to infections.  Neurological: Negative for dizziness, numbness, headaches and weakness.  Hematological: Negative for swollen glands.  Psychiatric/Behavioral: Negative for depressed mood and sleep disturbance. The patient is not nervous/anxious.     PMFS History:  Patient Active Problem List   Diagnosis Date Noted  . Exertional dyspnea 11/04/2018  . Dizziness 11/04/2018  . Hallux rigidus, bilateral 05/05/2018  . High risk medication use 11/17/2016  . DDD (degenerative disc disease), lumbar 11/17/2016  . Primary osteoarthritis of both knees 11/13/2016  . Pain in left ankle and joints of left foot 11/13/2016  . Essential hypertension 10/18/2016  . Left ventricular hypertrophy 10/18/2016  . PVC's (premature ventricular contractions) 10/18/2016  . History of diabetes mellitus 10/18/2016  . History of hypothyroidism 10/18/2016  . Dyslipidemia 10/18/2016  . Obesity (BMI 35.0-39.9 without comorbidity) 10/18/2016  . History of GI diverticular bleed 10/18/2016  . Stage 4 chronic kidney disease ([place]) 10/18/2016  . 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 Internal and external prolapsed hemorrhoids, bleeding. 10/05/2011    Past Medical History:  Diagnosis Date  . Arthritis   . Complication of anesthesia    pt states "hard to wake up"  . Diabetes mellitus   . Hemorrhoids   . Hyperlipidemia   . Hypertension   . Hypothyroidism   . Retinal cyst     Family History  Problem Relation Age of Onset  . Cancer Father        lung  .  Cancer Sister        breast   Past Surgical History:  Procedure Laterality Date  . ABDOMINAL HYSTERECTOMY    . BREAST LUMPECTOMY WITH RADIOACTIVE SEED LOCALIZATION Left 08/27/2014   Procedure: BREAST LUMPECTOMY WITH RADIOACTIVE SEED LOCALIZATION;  Surgeon: Autumn Messing III, MD;  Location: [facility];  Service:  General;  Laterality: Left;  . CATARACT EXTRACTION    . CHOLECYSTECTOMY     Social History   Social History Narrative  . Not on file   Immunization History  Administered Date(s) Administered  . Tdap 03/09/2018     Objective: Vital Signs: BP (!) 152/86 (BP Location: Left Arm, Patient Position: Sitting, Cuff Size: Normal)   Pulse 95   Resp 14   Ht _0  (1.6 m)   Wt 191 lb 12.8 oz (87 kg)   BMI 33.98 kg/m    Physical Exam Vitals signs and nursing note reviewed.  Constitutional:      Appearance: She is well-developed.  HENT:     Head: Normocephalic and atraumatic.  Eyes:     Conjunctiva/sclera: Conjunctivae normal.  Neck:     Musculoskeletal: Normal range of motion.  Cardiovascular:     Rate and Rhythm: Normal rate and regular rhythm.     Heart sounds: Normal heart sounds.  Pulmonary:     Effort: Pulmonary effort is normal.     Breath sounds: Normal breath sounds.  Abdominal:     General: Bowel sounds are normal.     Palpations: Abdomen is soft.  Lymphadenopathy:     Cervical: No cervical adenopathy.  Skin:    General: Skin is warm and dry.     Capillary Refill: Capillary refill takes less than 2 seconds.  Neurological:     Mental Status: She is alert and oriented to person, place, and time.  Psychiatric:        Behavior: Behavior normal.      Musculoskeletal Exam: C-spine, thoracic spine, and lumbar spine good ROM.  No midline spinal tenderness.  Shoulder joints, elbow joints, wrist joints, MCPs, PIPs, and DIPs good ROM with no synovitis. Hip joints have good ROM with no discomfort.  Knee joints good ROM with no warmth or effusion. No tenderness or inflammation of ankle joints. Hallux rigidus bilaterally.    CDAI Exam: CDAI Score: - Patient Global: -; Provider Global: - Swollen: -; Tender: - Joint Exam   No joint exam has been documented for this visit   There is currently no information documented on the homunculus. Go to the Rheumatology activity and  complete the homunculus joint exam.  Investigation: No additional findings.  Imaging: No results found.  Recent Labs: Lab Results  Component Value Date   WBC 7.3 11/09/2018   HGB 12.1 11/09/2018   PLT 334 11/09/2018   NA 129 (L) 11/09/2018   K 3.7 11/09/2018   CL 94 (L) 11/09/2018   CO2 20 (L) 11/09/2018   GLUCOSE 96 11/09/2018   BUN 14 11/09/2018   CREATININE 1.07 (H) 11/09/2018   CALCIUM 9.1 11/09/2018   GFRAA 57 (L) 11/09/2018    Speciality Comments: No specialty comments available.  Procedures:  No procedures performed Allergies: Shellfish allergy   Assessment / Plan:     Visit Diagnoses: Chorioretinitis of both eyes -  ESR 48, P-ANCA1:20, all other autoimmune work up is negative: She is followed by Dr. Manuella Ghazi.  Her most recent appointment was on 11/07/18.  She was previously taking MTX 4 tablets by mouth once weekly,  but she discontinued on July 2020 due to recurrent oral ulcerations.  She has not had any recent flares.  She has not noticed any changes in vision recently. She will continue following up with Dr. Manuella Ghazi.  She will follow up in our office in 6 months.  High risk medication use - She was previously taking MTX 10 tablets po once weekly but discontinued in July 2020 to developing recurrent oral ulcerations.    Primary osteoarthritis of both knees: She has good ROM with no discomfort.  No warmth or effusion. She has occasional discomfort in the right knee joint if she walks prolonged distances.  She was advised to take tylenol as needed for pain relief.   DDD (degenerative disc disease), lumbar: She has been experiencing increased lower back pain.  She states she experiences radiating pain in the right buttock region if she walks for prolonged distances.  She declined physical therapy or a MRI at this time.  She was given a handout of back exercises to perform.    Other medical conditions are listed as follows:   Dyslipidemia  History of diabetes mellitus  Left  ventricular hypertrophy  History of GI diverticular bleed  History of hypertension  History of hypothyroidism  Orders: No orders of the defined types were placed in this encounter.  No orders of the defined types were placed in this encounter.     Follow-Up Instructions: Return in about 6 months (around 06/09/2019) for Osteoarthritis.   Ofilia Neas, PA-C   I examined and evaluated the patient with Hazel Sams PA.  Patient states that she discontinued methotrexate due to oral ulcers.  She is on no immunosuppressive agents currently.  She states she will discuss this further with Dr. Manuella Ghazi at the follow-up visit.  She continues to have some discomfort due to underlying osteoarthritis.  Use of Tylenol and topical agents was discussed.  She has been also experiencing some lower back pain especially with prolonged walking.  She has underlying disc disease of lumbar spine.  I offered physical therapy which she declined.  Have given her a handout on back exercises.  The plan of care was discussed as noted above.  Bo Merino, MD  Note - This record has been created using Editor, commissioning.  Chart creation errors have been sought, but may not always  have been located. Such creation errors do not reflect on  the standard of medical care.

## 2018-11-27 ENCOUNTER — Ambulatory Visit: Payer: Medicare Other | Admitting: Rheumatology

## 2018-11-28 DIAGNOSIS — E871 Hypo-osmolality and hyponatremia: Secondary | ICD-10-CM | POA: Diagnosis not present

## 2018-12-03 ENCOUNTER — Telehealth: Payer: BLUE CROSS/BLUE SHIELD | Admitting: Cardiology

## 2018-12-03 NOTE — Progress Notes (Deleted)
Patient referred by Shon Baton, MD for shortness of breath  Subjective:   Tabitha Moore, female    DOB: December 31, 1940, 78 y.o.   MRN: [id number]   No chief complaint on file.   HPI  78 y.o. African American female with hypertension, type2 DM, hypothyroidism, now with shortness of breath and dizziness.   Patient lives alone. She has noticed shortness of breath with minimal exertion. She denies any chest pain. She has noticed constant dizziness. On a separate note, she has also noticed pain in her throat while swallowing.    Past Medical History:  Diagnosis Date  . Arthritis   . Complication of anesthesia    pt states "hard to wake up"  . Diabetes mellitus   . Hemorrhoids   . Hyperlipidemia   . Hypertension   . Hypothyroidism   . Retinal cyst      Past Surgical History:  Procedure Laterality Date  . ABDOMINAL HYSTERECTOMY    . BREAST LUMPECTOMY WITH RADIOACTIVE SEED LOCALIZATION Left 08/27/2014   Procedure: BREAST LUMPECTOMY WITH RADIOACTIVE SEED LOCALIZATION;  Surgeon: Autumn Messing III, MD;  Location: [facility];  Service: General;  Laterality: Left;  . CATARACT EXTRACTION    . CHOLECYSTECTOMY       Social History   Socioeconomic History  . Marital status: Widowed    Spouse name: Not on file  . Number of children: 1  . Years of education: Not on file  . Highest education level: Not on file  Occupational History  . Not on file  Social Needs  . Financial resource strain: Not on file  . Food insecurity    Worry: Not on file    Inability: Not on file  . Transportation needs    Medical: Not on file    Non-medical: Not on file  Tobacco Use  . Smoking status: Never Smoker  . Smokeless tobacco: Never Used  Substance and Sexual Activity  . Alcohol use: No  . Drug use: Never  . Sexual activity: Not on file  Lifestyle  . Physical activity    Days per week: Not on file    Minutes per session: Not on file  . Stress: Not on file  Relationships  .  Social Herbalist on phone: Not on file    Gets together: Not on file    Attends religious service: Not on file    Active member of club or organization: Not on file    Attends meetings of clubs or organizations: Not on file    Relationship status: Not on file  . Intimate partner violence    Fear of current or ex partner: Not on file    Emotionally abused: Not on file    Physically abused: Not on file    Forced sexual activity: Not on file  Other Topics Concern  . Not on file  Social History Narrative  . Not on file     Family History  Problem Relation Age of Onset  . Cancer Father        lung  . Cancer Sister        breast     Current Outpatient Medications on File Prior to Visit  Medication Sig Dispense Refill  . acetaminophen (TYLENOL) 325 MG tablet Take 650 mg by mouth every 6 (six) hours as needed for mild pain.    Marland Kitchen acetaminophen (TYLENOL) 500 MG tablet Take 1,000 mg by mouth every 6 (six) hours as  needed for mild pain.    Marland Kitchen amLODipine (NORVASC) 2.5 MG tablet Take 2.5 mg by mouth daily.    . Bacillus Coagulans-Inulin (PROBIOTIC FORMULA PO) Take 1 tablet by mouth daily.     Marland Kitchen BENICAR 40 MG tablet Take 40 mg by mouth daily.     . Cholecalciferol (VITAMIN D PO) Take 1,000 mg by mouth daily.     . CRESTOR 5 MG tablet Take 5 mg by mouth every morning.     . fluticasone (FLONASE) 50 MCG/ACT nasal spray Place 1 spray into both nostrils every other day.    . Ginger, Zingiber officinalis, (GINGER EXTRACT PO) Take 1 tablet by mouth daily.     Marland Kitchen glimepiride (AMARYL) 4 MG tablet Take 4 mg by mouth 2 (two) times a day.     . levothyroxine (SYNTHROID, LEVOTHROID) 88 MCG tablet Take 88 mcg by mouth daily.     . metFORMIN (GLUCOPHAGE) 500 MG tablet Take 500 mg by mouth every morning.     . metoprolol succinate (TOPROL-XL) 25 MG 24 hr tablet Take 12.5 mg by mouth daily.     . Misc Natural Products (TART CHERRY ADVANCED PO) Take 2 tablets by mouth daily.     Marland Kitchen omega-3 acid  ethyl esters (LOVAZA) 1 g capsule Take 1 g by mouth daily.    Marland Kitchen omeprazole (PRILOSEC) 40 MG capsule Take 40 mg by mouth daily.     . psyllium (HYDROCIL/METAMUCIL) 95 % PACK Take 1 packet by mouth daily as needed for mild constipation.    . Turmeric 400 MG CAPS Take 1 capsule by mouth daily.      No current facility-administered medications on file prior to visit.     Cardiovascular studies:  Carotid artery duplex  17-Nov-2018: Stenosis in the bilateral  internal carotid artery (1-15%). Mild heterogeneous plaque noted.  Antegrade right vertebral artery flow. Antegrade left vertebral artery flow.  Echocardiogram 11/17/2018 :  Normal LV systolic function with EF 55%. Left ventricle cavity is normal in size. Normal left ventricular wall thickness. Normal global wall motion. Doppler evidence of grade I (impaired) diastolic dysfunction, normal LAP. Calculated EF 55%. Left atrial cavity is normal in size. 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 Trileaflet aortic valve. Mild to moderate aortic regurgitation. Structurally normal tricuspid valve with trace regurgitation. Inadequate TR jet to estimate pulmonary artery systolic pressure.   EKG 11/04/2018: Sinus rhythm 72 bpm. Normal EKG.  Recent labs: 10/27/2018: Glucose 157. BUN/Cr 10/1.1. eGFR 57. Na/K 138/5.3.  H/H 12/40. MCV 93. Platelets 318.    Review of Systems  Constitution: Negative for decreased appetite, malaise/fatigue, weight gain and weight loss.  HENT: Positive for congestion and odynophagia.   Eyes: Negative for visual disturbance.  Cardiovascular: Positive for dyspnea on exertion. Negative for chest pain, leg swelling, palpitations and syncope.  Respiratory: Positive for shortness of breath. Negative for cough.   Endocrine: Negative for cold intolerance.  Hematologic/Lymphatic: Does not bruise/bleed easily.  Skin: Negative for itching and rash.  Musculoskeletal: Negative for myalgias.   Gastrointestinal: Negative for abdominal pain, nausea and vomiting.  Genitourinary: Negative for dysuria.  Neurological: Negative for dizziness and weakness.  Psychiatric/Behavioral: The patient is not nervous/anxious.   All other systems reviewed and are negative.         *** There is no height or weight on file to calculate BMI. There were no vitals filed for this visit.  Objective:   Physical Exam  Constitutional: She is oriented to person,  place, and time. She appears well-developed and well-nourished. No distress.  HENT:  Head: Normocephalic and atraumatic.  Eyes: Pupils are equal, round, and reactive to light. Conjunctivae are normal.  Neck: No JVD present.  Cardiovascular: Normal rate, regular rhythm and intact distal pulses.  Pulmonary/Chest: Effort normal and breath sounds normal. She has no wheezes. She has no rales.  Abdominal: Soft. Bowel sounds are normal. There is no rebound.  Musculoskeletal:        General: No edema.  Lymphadenopathy:    She has no cervical adenopathy.  Neurological: She is alert and oriented to person, place, and time. No cranial nerve deficit.  Skin: Skin is warm and dry.  Psychiatric: She has a normal mood and affect.  Nursing note and vitals reviewed.      Assessment & Recommendations:   78 y.o. African American female with hypertension, type2 DM, hypothyroidism, now with shortness of breath and dizziness.   1. Exertional dyspnea Risk factors for CAD. No heart failure on exam. Cannot exclude angina equivalent. Normal resting EKG but unable to exercise due to dyspnea. Will obtain echocardiogram, lexiscan nuclear stress test.   2. Dizziness I suspect ENT etiology for her symptoms. Will obtain carotid US.  3. Hypertension Uncontrolled. She is yet to start amlodipine 2.5 mg daily. Increase to 5 mg daily as tolerated.   Thank you for referring the patient to [place]. Please feel free to contact with any questions.  Nigel Mormon, MD  [facility] Pager: [phone] Office: [phone] If no answer Cell [phone]

## 2018-12-08 ENCOUNTER — Telehealth (INDEPENDENT_AMBULATORY_CARE_PROVIDER_SITE_OTHER): Payer: Medicare Other | Admitting: Cardiology

## 2018-12-08 DIAGNOSIS — I10 Essential (primary) hypertension: Secondary | ICD-10-CM | POA: Diagnosis not present

## 2018-12-08 DIAGNOSIS — R42 Dizziness and giddiness: Secondary | ICD-10-CM | POA: Diagnosis not present

## 2018-12-08 NOTE — Telephone Encounter (Signed)
Patient currently about having episode of dizziness, states that she has been taking Benicar 40 mg, amlodipine 10 mg daily, last couple days she cannot take amlodipine as she was feeling mildly dizzy.  Today she took both amlodipine and Benicar, since then has been feeling slightly dizzy and weak.  Advised her to monitor her blood pressure, to take Benicar in the morning and one half of 10 mg amlodipine in the evening and to hold amlodipine if systolic blood pressures less than 140 mmHg and to contact us back during regular business hours if symptoms persist.  Advised her to keep other appointments.  No history to suggest syncope, symptoms are mild, advised her to take it easy and to lay down and keep herself well-hydrated for tonight.  This was a 6-minute telephone visit encounter.

## 2018-12-10 ENCOUNTER — Other Ambulatory Visit: Payer: Self-pay

## 2018-12-10 ENCOUNTER — Encounter: Payer: Self-pay | Admitting: Rheumatology

## 2018-12-10 ENCOUNTER — Ambulatory Visit (INDEPENDENT_AMBULATORY_CARE_PROVIDER_SITE_OTHER): Payer: Medicare Other | Admitting: Rheumatology

## 2018-12-10 VITALS — BP 152/86 | HR 95 | Resp 14 | Ht 63.0 in | Wt 191.8 lb

## 2018-12-10 DIAGNOSIS — Z8639 Personal history of other endocrine, nutritional and metabolic disease: Secondary | ICD-10-CM

## 2018-12-10 DIAGNOSIS — M170 Bilateral primary osteoarthritis of knee: Secondary | ICD-10-CM | POA: Diagnosis not present

## 2018-12-10 DIAGNOSIS — H3093 Unspecified chorioretinal inflammation, bilateral: Secondary | ICD-10-CM | POA: Diagnosis not present

## 2018-12-10 DIAGNOSIS — Z8719 Personal history of other diseases of the digestive system: Secondary | ICD-10-CM | POA: Diagnosis not present

## 2018-12-10 DIAGNOSIS — M5136 Other intervertebral disc degeneration, lumbar region: Secondary | ICD-10-CM | POA: Diagnosis not present

## 2018-12-10 DIAGNOSIS — I517 Cardiomegaly: Secondary | ICD-10-CM | POA: Diagnosis not present

## 2018-12-10 DIAGNOSIS — E785 Hyperlipidemia, unspecified: Secondary | ICD-10-CM

## 2018-12-10 DIAGNOSIS — Z79899 Other long term (current) drug therapy: Secondary | ICD-10-CM | POA: Diagnosis not present

## 2018-12-10 DIAGNOSIS — Z8679 Personal history of other diseases of the circulatory system: Secondary | ICD-10-CM

## 2018-12-10 NOTE — Patient Instructions (Signed)

## 2018-12-17 ENCOUNTER — Other Ambulatory Visit: Payer: Self-pay

## 2018-12-17 ENCOUNTER — Ambulatory Visit (INDEPENDENT_AMBULATORY_CARE_PROVIDER_SITE_OTHER): Payer: Medicare Other

## 2018-12-17 DIAGNOSIS — R0609 Other forms of dyspnea: Secondary | ICD-10-CM

## 2018-12-17 DIAGNOSIS — R0600 Dyspnea, unspecified: Secondary | ICD-10-CM

## 2019-01-07 ENCOUNTER — Other Ambulatory Visit: Payer: Self-pay

## 2019-01-07 ENCOUNTER — Telehealth (INDEPENDENT_AMBULATORY_CARE_PROVIDER_SITE_OTHER): Payer: Medicare Other | Admitting: Cardiology

## 2019-01-07 ENCOUNTER — Encounter: Payer: Self-pay | Admitting: Cardiology

## 2019-01-07 ENCOUNTER — Telehealth: Payer: BLUE CROSS/BLUE SHIELD | Admitting: Cardiology

## 2019-01-07 VITALS — Ht 63.0 in | Wt 191.0 lb

## 2019-01-07 DIAGNOSIS — R0609 Other forms of dyspnea: Secondary | ICD-10-CM | POA: Diagnosis not present

## 2019-01-07 DIAGNOSIS — I351 Nonrheumatic aortic (valve) insufficiency: Secondary | ICD-10-CM | POA: Insufficient documentation

## 2019-01-07 DIAGNOSIS — R9439 Abnormal result of other cardiovascular function study: Secondary | ICD-10-CM | POA: Insufficient documentation

## 2019-01-07 DIAGNOSIS — R42 Dizziness and giddiness: Secondary | ICD-10-CM | POA: Diagnosis not present

## 2019-01-07 DIAGNOSIS — I10 Essential (primary) hypertension: Secondary | ICD-10-CM

## 2019-01-07 DIAGNOSIS — R0600 Dyspnea, unspecified: Secondary | ICD-10-CM

## 2019-01-07 NOTE — Progress Notes (Signed)
Patient referred by Shon Baton, MD for shortness of breath  Subjective:   Tabitha Moore, female    DOB: 1940/07/03, 78 y.o.   MRN: [id number]  I connected with the patient on 01/07/2019 by a telephone call and verified that I am speaking with the correct person using two identifiers.     I offered the patient a video enabled application for a virtual visit. Unfortunately, this could not be accomplished due to technical difficulties/lack of video enabled phone/computer. I discussed the limitations of evaluation and management by telemedicine and the availability of in person appointments. The patient expressed understanding and agreed to proceed.   This visit type was conducted due to national recommendations for restrictions regarding the COVID-19 Pandemic (e.g. social distancing).  This format is felt to be most appropriate for this patient at this time.  All issues noted in this document were discussed and addressed.  No physical exam was performed (except for noted visual exam findings with Tele health visits).  The patient has consented to conduct a Tele health visit and understands insurance will be billed.   Chief Complaint  Patient presents with  . Shortness of Breath  . Follow-up  . Results    HPI  78 y.o. African American female with hypertension, type2 DM, hypothyroidism, with shortness of breath and dizziness.   Given patient's risk factors for CAD, and no apparent heart failure on physical exam, I recommended Lexiscan nuclear stress test.  Stress test showed mild breast attenuation in anteroseptal region without evidence of ischemia.  However, her stress LVEF was moderately reduced at 31%.  Due to low stress EF, this was an intermediate risk study.  Echocardiogram showed EF 91%, grade 1 diastolic dysfunction, mild to moderate aortic regurgitation.  Her dizziness and shortness of breath symptoms have improved. She feels her breathing is better when she uses Vicks aid. She  denies any chest pain.     Past Medical History:  Diagnosis Date  . Arthritis   . Complication of anesthesia    pt states "hard to wake up"  . Diabetes mellitus   . Hemorrhoids   . Hyperlipidemia   . Hypertension   . Hypothyroidism   . Retinal cyst      Past Surgical History:  Procedure Laterality Date  . ABDOMINAL HYSTERECTOMY    . BREAST LUMPECTOMY WITH RADIOACTIVE SEED LOCALIZATION Left 08/27/2014   Procedure: BREAST LUMPECTOMY WITH RADIOACTIVE SEED LOCALIZATION;  Surgeon: Autumn Messing III, MD;  Location: [facility];  Service: General;  Laterality: Left;  . CATARACT EXTRACTION    . CHOLECYSTECTOMY       Social History   Socioeconomic History  . Marital status: Widowed    Spouse name: Not on file  . Number of children: 1  . Years of education: Not on file  . Highest education level: Not on file  Occupational History  . Not on file  Social Needs  . Financial resource strain: Not on file  . Food insecurity    Worry: Not on file    Inability: Not on file  . Transportation needs    Medical: Not on file    Non-medical: Not on file  Tobacco Use  . Smoking status: Never Smoker  . Smokeless tobacco: Never Used  Substance and Sexual Activity  . Alcohol use: No  . Drug use: Never  . Sexual activity: Not on file  Lifestyle  . Physical activity    Days per week: Not on file  Minutes per session: Not on file  . Stress: Not on file  Relationships  . Social Herbalist on phone: Not on file    Gets together: Not on file    Attends religious service: Not on file    Active member of club or organization: Not on file    Attends meetings of clubs or organizations: Not on file    Relationship status: Not on file  . Intimate partner violence    Fear of current or ex partner: Not on file    Emotionally abused: Not on file    Physically abused: Not on file    Forced sexual activity: Not on file  Other Topics Concern  . Not on file  Social  History Narrative  . Not on file     Family History  Problem Relation Age of Onset  . Cancer Father        lung  . Cancer Sister        breast     Current Outpatient Medications on File Prior to Visit  Medication Sig Dispense Refill  . acetaminophen (TYLENOL) 325 MG tablet Take 650 mg by mouth every 6 (six) hours as needed for mild pain.    Marland Kitchen acetaminophen (TYLENOL) 500 MG tablet Take 1,000 mg by mouth every 6 (six) hours as needed for mild pain.    Marland Kitchen amLODipine (NORVASC) 2.5 MG tablet Take 5 mg by mouth daily.    . Bacillus Coagulans-Inulin (PROBIOTIC FORMULA PO) Take 1 tablet by mouth daily.     Marland Kitchen BENICAR 40 MG tablet Take 40 mg by mouth daily.     . Cholecalciferol (VITAMIN D PO) Take 1,000 mg by mouth daily.     . CRESTOR 5 MG tablet Take 5 mg by mouth every morning.     . Ginger, Zingiber officinalis, (GINGER EXTRACT PO) Take 1 tablet by mouth daily.     Marland Kitchen glimepiride (AMARYL) 4 MG tablet Take 4 mg by mouth daily.    Marland Kitchen levothyroxine (SYNTHROID, LEVOTHROID) 88 MCG tablet Take 88 mcg by mouth daily.     . metFORMIN (GLUCOPHAGE) 500 MG tablet Take 500 mg by mouth every morning.     Marland Kitchen omega-3 acid ethyl esters (LOVAZA) 1 g capsule Take 1 g by mouth daily.    Marland Kitchen omeprazole (PRILOSEC) 40 MG capsule Take 40 mg by mouth daily.     . psyllium (HYDROCIL/METAMUCIL) 95 % PACK Take 1 packet by mouth daily as needed for mild constipation.    . Turmeric 400 MG CAPS Take 1 capsule by mouth daily.      No current facility-administered medications on file prior to visit.     Cardiovascular studies:  Leane Call Stress Test 12/17/18  Stress EKG is non-diagnostic, as this is pharmacological stress test. The left ventricular size is markedly reduced at 31 mL.  There is mild breast attenuation artifact in the anteroseptal region without evidence of ischemia.  Stress LV EF is moderately dysfunctional 31%.   Evaluation of LV systolic function Was difficult due to small size of the left  ventricle.  Intermediate risk study due to abnormal LVEF. No previous exam available for comparison. Clinical correlation recommended.  Echocardiogram 11/07/2018 :  Normal LV systolic function with EF 55%. Left ventricle cavity is normal in size. Normal left ventricular wall thickness. Normal global wall motion. Doppler evidence of grade I (impaired) diastolic dysfunction, normal LAP. Calculated EF 55%. Left atrial cavity is normal in size. 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 Trileaflet aortic valve. Mild to moderate aortic regurgitation. Structurally normal tricuspid valve with trace regurgitation. Inadequate TR jet to estimate pulmonary artery systolic pressure.  EKG 11/04/2018: Sinus rhythm 72 bpm. Normal EKG.  Recent labs: 10/27/2018: Glucose 157. BUN/Cr 10/1.1. eGFR 57. Na/K 138/5.3.  H/H 12/40. MCV 93. Platelets 318.    Review of Systems  Constitution: Negative for decreased appetite, malaise/fatigue, weight gain and weight loss.  HENT: Positive for congestion and odynophagia.   Eyes: Negative for visual disturbance.  Cardiovascular: Positive for dyspnea on exertion. Negative for chest pain, leg swelling, palpitations and syncope.  Respiratory: Positive for shortness of breath. Negative for cough.   Endocrine: Negative for cold intolerance.  Hematologic/Lymphatic: Does not bruise/bleed easily.  Skin: Negative for itching and rash.  Musculoskeletal: Negative for myalgias.  Gastrointestinal: Negative for abdominal pain, nausea and vomiting.  Genitourinary: Negative for dysuria.  Neurological: Negative for dizziness and weakness.  Psychiatric/Behavioral: The patient is not nervous/anxious.   All other systems reviewed and are negative.       No vitals available.  Objective:   Physical Exam  Constitutional: She is oriented to person, place, and time. She appears well-developed and well-nourished. No distress.  HENT:  Head: Normocephalic and  atraumatic.  Eyes: Pupils are equal, round, and reactive to light. Conjunctivae are normal.  Neck: No JVD present.  Cardiovascular: Normal rate, regular rhythm and intact distal pulses.  Pulmonary/Chest: Effort normal and breath sounds normal. She has no wheezes. She has no rales.  Abdominal: Soft. Bowel sounds are normal. There is no rebound.  Musculoskeletal:        General: No edema.  Lymphadenopathy:    She has no cervical adenopathy.  Neurological: She is alert and oriented to person, place, and time. No cranial nerve deficit.  Skin: Skin is warm and dry.  Psychiatric: She has a normal mood and affect.  Nursing note and vitals reviewed.      Assessment & Recommendations:   78 y.o. African American female with hypertension, type2 DM, hypothyroidism, now with shortness of breath and dizziness.   1. Exertional dyspnea Improved. Normal EF on echo. Stress EF low on stress test without any significant regional perfusion abnormalities. MY suspicion for obstructive CAD is low. Continue medical management.   2. Mild to moderate aortic regurgitation: Repeat echocardiogram in 1 year.   3. Dizziness Improved after using Vicks aid. Suspect ENT etiology for her symptoms.   3. Hypertension Does not check her BP regularly. Continue f/u w/PCP.  F/u in 1 year with repeat echocardiogram.   Nigel Mormon, MD [facility] Pager: [phone] Office: [phone] If no answer Cell [phone]

## 2019-02-03 DIAGNOSIS — Z961 Presence of intraocular lens: Secondary | ICD-10-CM | POA: Diagnosis not present

## 2019-02-03 DIAGNOSIS — H30033 Focal chorioretinal inflammation, peripheral, bilateral: Secondary | ICD-10-CM | POA: Diagnosis not present

## 2019-02-03 DIAGNOSIS — H26492 Other secondary cataract, left eye: Secondary | ICD-10-CM | POA: Diagnosis not present

## 2019-02-03 DIAGNOSIS — Z79899 Other long term (current) drug therapy: Secondary | ICD-10-CM | POA: Diagnosis not present

## 2019-02-03 DIAGNOSIS — Z794 Long term (current) use of insulin: Secondary | ICD-10-CM | POA: Diagnosis not present

## 2019-02-03 DIAGNOSIS — H3581 Retinal edema: Secondary | ICD-10-CM | POA: Diagnosis not present

## 2019-02-03 DIAGNOSIS — E113313 Type 2 diabetes mellitus with moderate nonproliferative diabetic retinopathy with macular edema, bilateral: Secondary | ICD-10-CM | POA: Diagnosis not present

## 2019-02-16 DIAGNOSIS — E871 Hypo-osmolality and hyponatremia: Secondary | ICD-10-CM | POA: Diagnosis not present

## 2019-02-16 DIAGNOSIS — H209 Unspecified iridocyclitis: Secondary | ICD-10-CM | POA: Diagnosis not present

## 2019-02-16 DIAGNOSIS — E1122 Type 2 diabetes mellitus with diabetic chronic kidney disease: Secondary | ICD-10-CM | POA: Diagnosis not present

## 2019-02-16 DIAGNOSIS — N1831 Chronic kidney disease, stage 3a: Secondary | ICD-10-CM | POA: Diagnosis not present

## 2019-02-16 DIAGNOSIS — R279 Unspecified lack of coordination: Secondary | ICD-10-CM | POA: Diagnosis not present

## 2019-02-16 DIAGNOSIS — I129 Hypertensive chronic kidney disease with stage 1 through stage 4 chronic kidney disease, or unspecified chronic kidney disease: Secondary | ICD-10-CM | POA: Diagnosis not present

## 2019-02-16 DIAGNOSIS — E039 Hypothyroidism, unspecified: Secondary | ICD-10-CM | POA: Diagnosis not present

## 2019-03-03 ENCOUNTER — Other Ambulatory Visit: Payer: Self-pay

## 2019-03-03 DIAGNOSIS — Z20828 Contact with and (suspected) exposure to other viral communicable diseases: Secondary | ICD-10-CM | POA: Diagnosis not present

## 2019-03-03 DIAGNOSIS — Z20822 Contact with and (suspected) exposure to covid-19: Secondary | ICD-10-CM

## 2019-03-04 LAB — NOVEL CORONAVIRUS, NAA: SARS-CoV-2, NAA: NOT DETECTED

## 2019-04-14 DIAGNOSIS — Z1212 Encounter for screening for malignant neoplasm of rectum: Secondary | ICD-10-CM | POA: Diagnosis not present

## 2019-04-23 DIAGNOSIS — G629 Polyneuropathy, unspecified: Secondary | ICD-10-CM | POA: Diagnosis not present

## 2019-04-23 DIAGNOSIS — M1990 Unspecified osteoarthritis, unspecified site: Secondary | ICD-10-CM | POA: Diagnosis not present

## 2019-04-23 DIAGNOSIS — I129 Hypertensive chronic kidney disease with stage 1 through stage 4 chronic kidney disease, or unspecified chronic kidney disease: Secondary | ICD-10-CM | POA: Diagnosis not present

## 2019-04-23 DIAGNOSIS — R252 Cramp and spasm: Secondary | ICD-10-CM | POA: Diagnosis not present

## 2019-04-23 DIAGNOSIS — I429 Cardiomyopathy, unspecified: Secondary | ICD-10-CM | POA: Diagnosis not present

## 2019-04-23 DIAGNOSIS — N1831 Chronic kidney disease, stage 3a: Secondary | ICD-10-CM | POA: Diagnosis not present

## 2019-04-23 DIAGNOSIS — I351 Nonrheumatic aortic (valve) insufficiency: Secondary | ICD-10-CM | POA: Diagnosis not present

## 2019-04-23 DIAGNOSIS — E871 Hypo-osmolality and hyponatremia: Secondary | ICD-10-CM | POA: Diagnosis not present

## 2019-04-23 DIAGNOSIS — E1122 Type 2 diabetes mellitus with diabetic chronic kidney disease: Secondary | ICD-10-CM | POA: Diagnosis not present

## 2019-05-05 ENCOUNTER — Telehealth (HOSPITAL_COMMUNITY): Payer: Self-pay

## 2019-05-05 NOTE — Telephone Encounter (Signed)

## 2019-05-06 ENCOUNTER — Other Ambulatory Visit (HOSPITAL_COMMUNITY): Payer: Self-pay | Admitting: Internal Medicine

## 2019-05-06 ENCOUNTER — Ambulatory Visit (HOSPITAL_COMMUNITY)
Admission: RE | Admit: 2019-05-06 | Discharge: 2019-05-06 | Disposition: A | Discharge status: Home / Self Care | Payer: Medicare Other | Source: Ambulatory Visit | Attending: Vascular Surgery | Admitting: Vascular Surgery

## 2019-05-06 ENCOUNTER — Other Ambulatory Visit: Payer: Self-pay

## 2019-05-06 DIAGNOSIS — R252 Cramp and spasm: Secondary | ICD-10-CM

## 2019-06-09 DIAGNOSIS — Z794 Long term (current) use of insulin: Secondary | ICD-10-CM | POA: Diagnosis not present

## 2019-06-09 DIAGNOSIS — Z961 Presence of intraocular lens: Secondary | ICD-10-CM | POA: Diagnosis not present

## 2019-06-09 DIAGNOSIS — H26492 Other secondary cataract, left eye: Secondary | ICD-10-CM | POA: Diagnosis not present

## 2019-06-09 DIAGNOSIS — E113313 Type 2 diabetes mellitus with moderate nonproliferative diabetic retinopathy with macular edema, bilateral: Secondary | ICD-10-CM | POA: Diagnosis not present

## 2019-06-09 DIAGNOSIS — H30033 Focal chorioretinal inflammation, peripheral, bilateral: Secondary | ICD-10-CM | POA: Diagnosis not present

## 2019-06-09 DIAGNOSIS — Z79899 Other long term (current) drug therapy: Secondary | ICD-10-CM | POA: Diagnosis not present

## 2019-06-09 DIAGNOSIS — H3581 Retinal edema: Secondary | ICD-10-CM | POA: Diagnosis not present

## 2019-06-17 DIAGNOSIS — E7849 Other hyperlipidemia: Secondary | ICD-10-CM | POA: Diagnosis not present

## 2019-06-17 DIAGNOSIS — E038 Other specified hypothyroidism: Secondary | ICD-10-CM | POA: Diagnosis not present

## 2019-06-17 DIAGNOSIS — E1122 Type 2 diabetes mellitus with diabetic chronic kidney disease: Secondary | ICD-10-CM | POA: Diagnosis not present

## 2019-06-25 DIAGNOSIS — M542 Cervicalgia: Secondary | ICD-10-CM | POA: Diagnosis not present

## 2019-06-25 DIAGNOSIS — H209 Unspecified iridocyclitis: Secondary | ICD-10-CM | POA: Diagnosis not present

## 2019-06-25 DIAGNOSIS — N1831 Chronic kidney disease, stage 3a: Secondary | ICD-10-CM | POA: Diagnosis not present

## 2019-06-25 DIAGNOSIS — R252 Cramp and spasm: Secondary | ICD-10-CM | POA: Diagnosis not present

## 2019-06-25 DIAGNOSIS — I429 Cardiomyopathy, unspecified: Secondary | ICD-10-CM | POA: Diagnosis not present

## 2019-06-25 DIAGNOSIS — R279 Unspecified lack of coordination: Secondary | ICD-10-CM | POA: Diagnosis not present

## 2019-06-25 DIAGNOSIS — I351 Nonrheumatic aortic (valve) insufficiency: Secondary | ICD-10-CM | POA: Diagnosis not present

## 2019-06-25 DIAGNOSIS — I77819 Aortic ectasia, unspecified site: Secondary | ICD-10-CM | POA: Diagnosis not present

## 2019-06-25 DIAGNOSIS — G629 Polyneuropathy, unspecified: Secondary | ICD-10-CM | POA: Diagnosis not present

## 2019-06-25 DIAGNOSIS — E1122 Type 2 diabetes mellitus with diabetic chronic kidney disease: Secondary | ICD-10-CM | POA: Diagnosis not present

## 2019-06-25 DIAGNOSIS — Z0000 Encounter for general adult medical examination without abnormal findings: Secondary | ICD-10-CM | POA: Diagnosis not present

## 2019-06-25 DIAGNOSIS — E871 Hypo-osmolality and hyponatremia: Secondary | ICD-10-CM | POA: Diagnosis not present

## 2019-07-02 DIAGNOSIS — R82998 Other abnormal findings in urine: Secondary | ICD-10-CM | POA: Diagnosis not present

## 2019-07-02 DIAGNOSIS — I10 Essential (primary) hypertension: Secondary | ICD-10-CM | POA: Diagnosis not present

## 2019-07-24 DIAGNOSIS — H3581 Retinal edema: Secondary | ICD-10-CM | POA: Diagnosis not present

## 2019-07-24 DIAGNOSIS — Z961 Presence of intraocular lens: Secondary | ICD-10-CM | POA: Diagnosis not present

## 2019-07-24 DIAGNOSIS — E113313 Type 2 diabetes mellitus with moderate nonproliferative diabetic retinopathy with macular edema, bilateral: Secondary | ICD-10-CM | POA: Diagnosis not present

## 2019-07-24 DIAGNOSIS — Z794 Long term (current) use of insulin: Secondary | ICD-10-CM | POA: Diagnosis not present

## 2019-07-24 DIAGNOSIS — Z79899 Other long term (current) drug therapy: Secondary | ICD-10-CM | POA: Diagnosis not present

## 2019-07-24 DIAGNOSIS — H26492 Other secondary cataract, left eye: Secondary | ICD-10-CM | POA: Diagnosis not present

## 2019-07-24 DIAGNOSIS — H30033 Focal chorioretinal inflammation, peripheral, bilateral: Secondary | ICD-10-CM | POA: Diagnosis not present

## 2019-08-14 DIAGNOSIS — Z1212 Encounter for screening for malignant neoplasm of rectum: Secondary | ICD-10-CM | POA: Diagnosis not present

## 2019-08-14 DIAGNOSIS — Z1211 Encounter for screening for malignant neoplasm of colon: Secondary | ICD-10-CM | POA: Diagnosis not present

## 2019-08-20 NOTE — Progress Notes (Signed)
Office Visit Note  Patient: Tabitha Moore             Date of Birth: 19-Dec-1940           MRN: [id number]             PCP: Shon Baton, MD Referring: Shon Baton, MD Visit Date: 08/25/2019 Occupation: '@GUAROCC' @  Subjective:  Right piriformis syndrome   History of Present Illness: Tabitha Moore is a 79 y.o. female with history of Chorioretinitis of both eyes and osteoarthritis.  She presents today with right piriformis syndrome which has been progressively worsening over the past 2 weeks.  She is having difficulty sitting due to the discomfort.  She is not having any radiating pain.  She denies any groin pain.  She denies any recent injuries or falls.  She has been taking aspirin as needed for pain relief.  She says she is also been experiencing increased right knee joint pain which she attributes to gait change.  She denies any swelling or warmth in her right knee. According to the patient she continues to have recurrent flares in both eyes.  She is followed closely by Dr. Manuella Ghazi.  She was started on Imuran 50 mg half tablet by mouth daily on 08/07/2019.  She discontinued Imuran 2 days ago due to thinking it was amplifying the pain in her right hip.  She is following up with Dr. Manuella Ghazi today.   Activities of Daily Living:  Patient reports joint stiffness all day  Patient Reports nocturnal pain.  Difficulty dressing/grooming: Denies Difficulty climbing stairs: Reports Difficulty getting out of chair: Reports Difficulty using hands for taps, buttons, cutlery, and/or writing: Denies  Review of Systems  Constitutional: Positive for fatigue.  HENT: Negative for mouth sores, mouth dryness and nose dryness.   Eyes: Positive for dryness. Negative for pain and visual disturbance.  Respiratory: Negative for cough, hemoptysis, shortness of breath and difficulty breathing.   Cardiovascular: Negative for chest pain, palpitations, hypertension and swelling in legs/feet.  Gastrointestinal: Positive  for constipation. Negative for blood in stool and diarrhea.  Genitourinary: Negative for difficulty urinating and painful urination.  Musculoskeletal: Positive for arthralgias, joint pain, joint swelling, muscle weakness, morning stiffness and muscle tenderness. Negative for myalgias and myalgias.  Skin: Negative for color change, pallor, rash, hair loss, nodules/bumps, skin tightness, ulcers and sensitivity to sunlight.  Allergic/Immunologic: Negative for susceptible to infections.  Neurological: Negative for dizziness, numbness and headaches.  Hematological: Negative for bruising/bleeding tendency and swollen glands.  Psychiatric/Behavioral: Negative for depressed mood and sleep disturbance. The patient is not nervous/anxious.     PMFS History:  Patient Active Problem List   Diagnosis Date Noted  . Abnormal stress test 01/07/2019  . Nonrheumatic aortic valve insufficiency 01/07/2019  . Exertional dyspnea 11/04/2018  . Dizziness 11/04/2018  . Hallux rigidus, bilateral 05/05/2018  . High risk medication use 11/17/2016  . DDD (degenerative disc disease), lumbar 11/17/2016  . Primary osteoarthritis of both knees 11/13/2016  . Pain in left ankle and joints of left foot 11/13/2016  . Essential hypertension 10/18/2016  . Left ventricular hypertrophy 10/18/2016  . PVC's (premature ventricular contractions) 10/18/2016  . History of diabetes mellitus 10/18/2016  . History of hypothyroidism 10/18/2016  . Dyslipidemia 10/18/2016  . Obesity (BMI 35.0-39.9 without comorbidity) 10/18/2016  . History of GI diverticular bleed 10/18/2016  . Stage 4 chronic kidney disease ([place]) 10/18/2016  . 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 Internal and external prolapsed hemorrhoids, bleeding. 10/05/2011  Past Medical History:  Diagnosis Date  . Arthritis   . Complication of anesthesia    pt states "hard to wake up"  . Diabetes mellitus   . Hemorrhoids   . Hyperlipidemia   . Hypertension   .  Hypothyroidism   . Retinal cyst     Family History  Problem Relation Age of Onset  . Cancer Father        lung  . Cancer Sister        breast   Past Surgical History:  Procedure Laterality Date  . ABDOMINAL HYSTERECTOMY    . BREAST LUMPECTOMY WITH RADIOACTIVE SEED LOCALIZATION Left 08/27/2014   Procedure: BREAST LUMPECTOMY WITH RADIOACTIVE SEED LOCALIZATION;  Surgeon: Autumn Messing III, MD;  Location: [facility];  Service: General;  Laterality: Left;  . CATARACT EXTRACTION    . CHOLECYSTECTOMY     Social History   Social History Narrative  . Not on file   Immunization History  Administered Date(s) Administered  . Tdap 03/09/2018     Objective: Vital Signs: BP 138/80 (BP Location: Right Arm, Patient Position: Sitting, Cuff Size: Normal)   Pulse 90   Resp 18   Ht '5\' 2"'  (1.575 m)   Wt 194 lb 9.6 oz (88.3 kg)   BMI 35.59 kg/m    Physical Exam Vitals and nursing note reviewed.  Constitutional:      Appearance: She is well-developed.  HENT:     Head: Normocephalic and atraumatic.  Eyes:     Conjunctiva/sclera: Conjunctivae normal.  Pulmonary:     Effort: Pulmonary effort is normal.  Abdominal:     General: Bowel sounds are normal.     Palpations: Abdomen is soft.  Musculoskeletal:     Cervical back: Normal range of motion.  Lymphadenopathy:     Cervical: No cervical adenopathy.  Skin:    General: Skin is warm and dry.     Capillary Refill: Capillary refill takes less than 2 seconds.  Neurological:     Mental Status: She is alert and oriented to person, place, and time.  Psychiatric:        Behavior: Behavior normal.      Musculoskeletal Exam: C-spine, thoracic spine, and lumbar spine good ROM.  Shoulder joints, elbow joints, wrist joints, MCPs, PIPs, and DIPs good ROM with no synovitis.  Complete fist formation bilaterally.  Hip joints good ROM with no discomfort.  Painful ROM in right knee joint.  No warmth or effusion of knee joints.  Ankle joints  good ROM with no tenderness or inflammation.  Right piriformis syndrome.   CDAI Exam: CDAI Score: -- Patient Global: --; Provider Global: -- Swollen: --; Tender: -- Joint Exam 08/25/2019   No joint exam has been documented for this visit   There is currently no information documented on the homunculus. Go to the Rheumatology activity and complete the homunculus joint exam.  Investigation: No additional findings.  Imaging: XR Lumbar Spine 2-3 Views  Result Date: 08/25/2019 Dextroscoliosis was noted.  Multilevel . was noted.  T12-L1 severe narrowing and L3-L4 narrowing was noted.  L4-L5 spondylolisthesis was noted.  Facet joint arthropathy was noted.  No SI joint changes were noted. 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  XR Pelvis 1-2 Views  Result Date: 08/25/2019 No significant hip joint narrowing was noted.  Mild SI joint arthritis was noted. Impression: Unremarkable x-ray of the pelvis.   Recent Labs: Lab Results  Component Value Date  WBC 7.3 11/09/2018   HGB 12.1 11/09/2018   PLT 334 11/09/2018   NA 129 (L) 11/09/2018   K 3.7 11/09/2018   CL 94 (L) 11/09/2018   CO2 20 (L) 11/09/2018   GLUCOSE 96 11/09/2018   BUN 14 11/09/2018   CREATININE 1.07 (H) 11/09/2018   CALCIUM 9.1 11/09/2018   GFRAA 57 (L) 11/09/2018    Speciality Comments: No specialty comments available.  Procedures:  Trigger Point Inj  Date/Time: 08/25/2019 10:59 AM Performed by: Ofilia Neas, PA-C Authorized by: Ofilia Neas, PA-C   Consent Given by:  Patient Site marked: the procedure site was marked   Timeout: prior to procedure the correct patient, procedure, and site was verified   Indications:  Muscle spasm and pain Total # of Trigger Points:  1 Location: back   Location comment:  Right piriformis Needle Size:  27 G Approach:  Dorsal Medications #1:  40 mg triamcinolone acetonide 40 MG/ML; 1 mL lidocaine 1  % Patient tolerance:  Patient tolerated the procedure well with no immediate complications   Allergies: Shellfish allergy   Assessment / Plan:     Visit Diagnoses: 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 She was evaluated on 07/24/2019, and she was started on Imuran 50 mg half tablet by mouth daily.  She started on Imuran 50 mg half tablet by mouth daily on 08/07/2019 and has been tolerating it without any side effects.  In the past she discontinued methotrexate due to oral ulcerations and discontinued CellCept due to tongue swelling.  She is sensitive to systemic therapy according to Dr. Trena Platt note.  She will require frequent lab monitoring and close follow-up.  She has an appointment with Dr. Manuella Ghazi today.  She will follow up with [place] in 6 months.   High risk medication use - She was previously taking MTX 10 tablets po once weekly but discontinued in July 2020 to developing recurrent oral ulcerations.  She was started on Imuran 50 mg half tablet by mouth daily by Dr. Manuella Ghazi on 08/07/19.    Primary osteoarthritis of both knees: She experiences intermittent discomfort in both knee joints especially the right knee joint.  She has no warmth or effusion on exam today.  DDD (degenerative disc disease), lumbar: She experiences intermittent lower back pain.  She has no midline spinal tenderness at this time.  X-rays of the lumbar spine were updated today.   Chronic right-sided low back pain without sciatica: She has been experiencing increased right-sided lower back pain for the past 2 weeks.  She has tenderness to palpation over the piriformis muscle of the the right hip.  She has no midline spinal tenderness on exam.  No SI joint tenderness or tenderness over the trochanteric bursa.  X-rays of the lumbar spine and pelvis were obtained today.  She has multilevel severe  spondylosis and facet joint arthropathy.  She was  advised to notify us if her discomfort persists or worsens and we will refer her to a spine specialist.   Piriformis syndrome of right side: She has tenderness palpation over the right piriformis muscle.  She has been experiencing progressively worsening discomfort over the past 2 weeks.  She has had difficulty sitting due to the discomfort.  X-rays of the lumbar spine and pelvis were obtained today.  She requested a cortisone injection today.  She tolerated the procedure well.  The procedure note was completed above.  Aftercare was discussed.  She was given a handout of exercises to perform.  She was advised to notify us if her discomfort persists or worsens.  Other medical conditions are listed as follows:   Dyslipidemia  History of GI diverticular bleed  Left ventricular hypertrophy  History of diabetes mellitus  History of hypothyroidism  History of hypertension  Orders: Orders Placed This Encounter  Procedures  . Trigger Point Inj  . XR Pelvis 1-2 Views  . XR Lumbar Spine 2-3 Views   No orders of the defined types were placed in this encounter.   Face-to-face time spent with patient was 30 minutes. Greater than 50% of time was spent in counseling and coordination of care.  Follow-Up Instructions: Return in about 6 months (around 02/25/2020) for Chorioretinitis of both eyes .   Ofilia Neas, PA-C  Note - This record has been created using Dragon software.  Chart creation errors have been sought, but may not always  have been located. Such creation errors do not reflect on  the standard of medical care.

## 2019-08-25 ENCOUNTER — Ambulatory Visit: Payer: Self-pay

## 2019-08-25 ENCOUNTER — Ambulatory Visit (INDEPENDENT_AMBULATORY_CARE_PROVIDER_SITE_OTHER): Payer: Medicare Other | Admitting: Physician Assistant

## 2019-08-25 ENCOUNTER — Other Ambulatory Visit: Payer: Self-pay

## 2019-08-25 ENCOUNTER — Encounter: Payer: Self-pay | Admitting: Physician Assistant

## 2019-08-25 VITALS — BP 138/80 | HR 90 | Resp 18 | Ht 62.0 in | Wt 194.6 lb

## 2019-08-25 DIAGNOSIS — Z794 Long term (current) use of insulin: Secondary | ICD-10-CM | POA: Diagnosis not present

## 2019-08-25 DIAGNOSIS — H30033 Focal chorioretinal inflammation, peripheral, bilateral: Secondary | ICD-10-CM | POA: Diagnosis not present

## 2019-08-25 DIAGNOSIS — H3093 Unspecified chorioretinal inflammation, bilateral: Secondary | ICD-10-CM

## 2019-08-25 DIAGNOSIS — E113313 Type 2 diabetes mellitus with moderate nonproliferative diabetic retinopathy with macular edema, bilateral: Secondary | ICD-10-CM | POA: Diagnosis not present

## 2019-08-25 DIAGNOSIS — M51369 Other intervertebral disc degeneration, lumbar region without mention of lumbar back pain or lower extremity pain: Secondary | ICD-10-CM

## 2019-08-25 DIAGNOSIS — Z8639 Personal history of other endocrine, nutritional and metabolic disease: Secondary | ICD-10-CM | POA: Diagnosis not present

## 2019-08-25 DIAGNOSIS — M170 Bilateral primary osteoarthritis of knee: Secondary | ICD-10-CM

## 2019-08-25 DIAGNOSIS — M545 Low back pain, unspecified: Secondary | ICD-10-CM

## 2019-08-25 DIAGNOSIS — E785 Hyperlipidemia, unspecified: Secondary | ICD-10-CM | POA: Diagnosis not present

## 2019-08-25 DIAGNOSIS — M5136 Other intervertebral disc degeneration, lumbar region: Secondary | ICD-10-CM | POA: Diagnosis not present

## 2019-08-25 DIAGNOSIS — Z79899 Other long term (current) drug therapy: Secondary | ICD-10-CM | POA: Diagnosis not present

## 2019-08-25 DIAGNOSIS — Z8679 Personal history of other diseases of the circulatory system: Secondary | ICD-10-CM

## 2019-08-25 DIAGNOSIS — G5701 Lesion of sciatic nerve, right lower limb: Secondary | ICD-10-CM

## 2019-08-25 DIAGNOSIS — G8929 Other chronic pain: Secondary | ICD-10-CM | POA: Diagnosis not present

## 2019-08-25 DIAGNOSIS — I517 Cardiomegaly: Secondary | ICD-10-CM

## 2019-08-25 DIAGNOSIS — Z8719 Personal history of other diseases of the digestive system: Secondary | ICD-10-CM

## 2019-08-25 DIAGNOSIS — Z961 Presence of intraocular lens: Secondary | ICD-10-CM | POA: Diagnosis not present

## 2019-08-25 DIAGNOSIS — H26492 Other secondary cataract, left eye: Secondary | ICD-10-CM | POA: Diagnosis not present

## 2019-08-25 DIAGNOSIS — H3581 Retinal edema: Secondary | ICD-10-CM | POA: Diagnosis not present

## 2019-08-25 MED ORDER — LIDOCAINE HCL 1 % IJ SOLN
1.0000 mL | INTRAMUSCULAR | Status: AC | PRN
  Administered 2019-08-25: 1 mL

## 2019-08-25 MED ORDER — TRIAMCINOLONE ACETONIDE 40 MG/ML IJ SUSP
40.0000 mg | INTRAMUSCULAR | Status: AC | PRN
  Administered 2019-08-25: 40 mg via INTRAMUSCULAR

## 2019-08-25 NOTE — Patient Instructions (Signed)
Piriformis Syndrome Rehab Ask your health care provider which exercises are safe for you. Do exercises exactly as told by your health care provider and adjust them as directed. It is normal to feel mild stretching, pulling, tightness, or discomfort as you do these exercises. Stop right away if you feel sudden pain or your pain gets worse. Do not begin these exercises until told by your health care provider. Stretching and range-of-motion exercises These exercises warm up your muscles and joints and improve the movement and flexibility of your hip and pelvis. The exercises also help to relieve pain, numbness, and tingling. Hip rotation This is an exercise in which you lie on your back and stretch the muscles that rotate your hip (hip rotators) to stretch your buttocks. 1. Lie on your back on a firm surface. 2. Pull your left / right knee toward your same shoulder with your left / right hand until your knee is pointing toward the ceiling. Hold your left / right ankle with your other hand. 3. Keeping your knee steady, gently pull your left / right ankle toward your other shoulder until you feel a stretch in your buttocks. 4. Hold this position for __________ seconds. Repeat __________ times. Complete this exercise __________ times a day. Hip extensor This is an exercise in which you lie on your back and pull your knee to your chest. 1. Lie on your back on a firm surface. Both of your legs should be straight. 2. Pull your left / right knee to your chest. Hold your leg in this position by holding onto the back of your thigh or the front of your knee. 3. Hold this position for __________ seconds. 4. Slowly return to the starting position. Repeat __________ times. Complete this exercise __________ times a day. Strengthening exercises These exercises build strength and endurance in your hip and thigh muscles. Endurance is the ability to use your muscles for a long time, even after they get  tired. Straight leg raises, side-lying This exercise strengthens the muscles that rotate the leg at the hip and move it away from your body (hip abductors). 1. Lie on your side with your left / right leg in the top position. Lie so your head, shoulder, knee, and hip line up. Bend your bottom knee to help you balance. 2. Lift your top leg 4-6 inches (10-15 cm) while keeping your toes pointed straight ahead. 3. Hold this position for __________ seconds. 4. Slowly lower your leg to the starting position. 5. Let your muscles relax completely after each repetition. Repeat __________ times. Complete this exercise __________ times a day. Hip abduction and rotation This is sometimes called quadruped (on hands and knees) exercises. 1. Get on your hands and knees on a firm, lightly padded surface. Your hands should be directly below your shoulders, and your knees should be directly below your hips. 2. Lift your left / right knee out to the side. Keep your knee bent. Do not twist your body. 3. Hold this position for __________ seconds. 4. Slowly lower your leg. Repeat __________ times. Complete this exercise __________ times a day. Straight leg raises, face-down This exercise stretches the muscles that move your hips away from the front of the pelvis (hip extensors). 1. Lie on your abdomen on a bed or a firm surface with a pillow under your hips. 2. Squeeze your buttocks muscles and lift your left / right leg about 4-6 inches (10-15 cm) off the bed. Do not let your back arch. 3. Hold  this position for __________ seconds. 4. Slowly lower your leg to the starting position. 5. Let your muscles relax completely after each repetition. Repeat __________ times. Complete this exercise __________ times a day. This information is not intended to replace advice given to you by your health care provider. Make sure you discuss any questions you have with your health care provider. Document Revised: 07/17/2018  Document Reviewed: 01/16/2018 Elsevier Patient Education  2020 [employer]  

## 2019-08-31 ENCOUNTER — Telehealth: Payer: Self-pay | Admitting: Rheumatology

## 2019-08-31 DIAGNOSIS — G8929 Other chronic pain: Secondary | ICD-10-CM

## 2019-08-31 DIAGNOSIS — M545 Low back pain, unspecified: Secondary | ICD-10-CM

## 2019-08-31 NOTE — Telephone Encounter (Signed)
Patient states hip injection she received last Tuesday has not helped at all with the pain, and stiffness. Please call to advise.

## 2019-08-31 NOTE — Telephone Encounter (Signed)
Please refer the patient to Dr. Louanne Skye.  The discomfort is likely radiating from her lower back, so she will require further evaluation.

## 2019-08-31 NOTE — Telephone Encounter (Signed)
Patient received a hip injection on 08/25/2019 and has not gotten any relief. Please advise of next step.

## 2019-08-31 NOTE — Telephone Encounter (Signed)
Advised patient we will refer to Dr. Louanne Skye. Patient verbalized understanding. Referral placed.

## 2019-09-08 ENCOUNTER — Other Ambulatory Visit: Payer: Self-pay

## 2019-09-08 ENCOUNTER — Ambulatory Visit (INDEPENDENT_AMBULATORY_CARE_PROVIDER_SITE_OTHER): Payer: Medicare Other | Admitting: Orthopaedic Surgery

## 2019-09-08 ENCOUNTER — Encounter: Payer: Self-pay | Admitting: Orthopaedic Surgery

## 2019-09-08 ENCOUNTER — Telehealth: Payer: Self-pay | Admitting: Orthopaedic Surgery

## 2019-09-08 VITALS — Ht 62.0 in | Wt 194.0 lb

## 2019-09-08 DIAGNOSIS — M545 Low back pain, unspecified: Secondary | ICD-10-CM

## 2019-09-08 DIAGNOSIS — G8929 Other chronic pain: Secondary | ICD-10-CM

## 2019-09-08 NOTE — Telephone Encounter (Signed)
Ok thanks 

## 2019-09-08 NOTE — Progress Notes (Signed)
Office Visit Note   Patient: Tabitha Moore           Date of Birth: 1940/11/28           MRN: [id number] Visit Date: 09/08/2019              Requested by: Ofilia Neas, PA-C [address] [place],  [place] [address] PCP: Shon Baton, MD   Assessment & Plan: Visit Diagnoses:  1. Chronic right-sided low back pain without sciatica     Plan: Impression is lumbar spondylosis and degenerative disc disease.  She has a fair amount of degenerative changes of her lumbar spine.  I do not get the sense that she has radiculopathy.  I have recommended prednisone Dosepak for now.  She will check with her ophthalmologist to make sure that this is okay to take.  We will await her response on this.  Follow-up as needed.  Follow-Up Instructions: Return if symptoms worsen or fail to improve.   Orders:  No orders of the defined types were placed in this encounter.  No orders of the defined types were placed in this encounter.     Procedures: No procedures performed   Clinical Data: No additional findings.   Subjective: Chief Complaint  Patient presents with  . Lower Back - Pain    Tabitha Moore is a 79 year old female who comes in for evaluation of chronic low back pain and right hip pain in the buttock area.  Denies any groin pain.  Standing and activity make the pain worse.  Numbness and tingling.  Denies any bowel or bladder dysfunction.   Review of Systems  Constitutional: Negative.   HENT: Negative.   Eyes: Negative.   Respiratory: Negative.   Cardiovascular: Negative.   Endocrine: Negative.   Musculoskeletal: Negative.   Neurological: Negative.   Hematological: Negative.   Psychiatric/Behavioral: Negative.   All other systems reviewed and are negative.    Objective: Vital Signs: Ht 5\' 2"  (1.575 m)   Wt 194 lb (88 kg)   BMI 35.48 kg/m   Physical Exam Vitals and nursing note reviewed.  Constitutional:      Appearance: She is well-developed.  Pulmonary:   Effort: Pulmonary effort is normal.  Skin:    General: Skin is warm.     Capillary Refill: Capillary refill takes less than 2 seconds.  Neurological:     Mental Status: She is alert and oriented to person, place, and time.  Psychiatric:        Behavior: Behavior normal.        Thought Content: Thought content normal.        Judgment: Judgment normal.     Ortho Exam Low back exam shows tenderness along the lumbar spine and the lower right portion of the lumbar musculature.  No focal motor or sensory deficits of the right lower extremity.  No reproducible groin pain. Specialty Comments:  No specialty comments available.  Imaging: No results found.   PMFS History: Patient Active Problem List   Diagnosis Date Noted  . Abnormal stress test 01/07/2019  . Nonrheumatic aortic valve insufficiency 01/07/2019  . Exertional dyspnea 11/04/2018  . Dizziness 11/04/2018  . Hallux rigidus, bilateral 05/05/2018  . High risk medication use 11/17/2016  . DDD (degenerative disc disease), lumbar 11/17/2016  . Primary osteoarthritis of both knees 11/13/2016  . Pain in left ankle and joints of left foot 11/13/2016  . Essential hypertension 10/18/2016  . Left ventricular hypertrophy 10/18/2016  . PVC's (  premature ventricular contractions) 10/18/2016  . History of diabetes mellitus 10/18/2016  . History of hypothyroidism 10/18/2016  . Dyslipidemia 10/18/2016  . Obesity (BMI 35.0-39.9 without comorbidity) 10/18/2016  . History of GI diverticular bleed 10/18/2016  . Stage 4 chronic kidney disease ([place]) 10/18/2016  . 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 Internal and external prolapsed hemorrhoids, bleeding. 10/05/2011   Past Medical History:  Diagnosis Date  . Arthritis   . Complication of anesthesia    pt states "hard to wake up"  . Diabetes mellitus   . Hemorrhoids   . Hyperlipidemia   . Hypertension   . Hypothyroidism   . Retinal cyst     Family History  Problem Relation Age of  Onset  . Cancer Father        lung  . Cancer Sister        breast    Past Surgical History:  Procedure Laterality Date  . ABDOMINAL HYSTERECTOMY    . BREAST LUMPECTOMY WITH RADIOACTIVE SEED LOCALIZATION Left 08/27/2014   Procedure: BREAST LUMPECTOMY WITH RADIOACTIVE SEED LOCALIZATION;  Surgeon: Autumn Messing III, MD;  Location: [facility];  Service: General;  Laterality: Left;  . CATARACT EXTRACTION    . CHOLECYSTECTOMY     Social History   Occupational History  . Not on file  Tobacco Use  . Smoking status: Never Smoker  . Smokeless tobacco: Never Used  Substance and Sexual Activity  . Alcohol use: No  . Drug use: Never  . Sexual activity: Not on file

## 2019-09-08 NOTE — Telephone Encounter (Signed)
Patient stopped by   She was seen earlier and told to get back to [place] with the name of her medication. The name is Azathioprine 50 mg.  Prescribed by Dwana Melena MD  Call back: [phone]

## 2019-09-11 ENCOUNTER — Telehealth: Payer: Self-pay | Admitting: Orthopaedic Surgery

## 2019-09-11 NOTE — Telephone Encounter (Signed)
Patient called.   She was told to report back with the name of her medication from her other provider so that Erlinda Hong could prescribe his medication accordingly.  She still hasnt heard back from him   Call back: [phone]

## 2019-09-16 ENCOUNTER — Telehealth: Payer: Self-pay | Admitting: Orthopaedic Surgery

## 2019-09-16 NOTE — Telephone Encounter (Signed)
Patient called.   She is requesting a call back about medication she was told she would receive and raise questions about an injection.   Call back: [phone]

## 2019-09-17 NOTE — Telephone Encounter (Signed)
I guess she's referring to the dose pak?

## 2019-09-17 NOTE — Telephone Encounter (Signed)
See message. Which medication?

## 2019-09-18 ENCOUNTER — Telehealth: Payer: Self-pay | Admitting: Orthopaedic Surgery

## 2019-09-18 NOTE — Telephone Encounter (Signed)
Called patient no answer. Could not leave VM. Will try again later. Not sure what medication she is referring to.

## 2019-09-18 NOTE — Telephone Encounter (Signed)
Please advise 

## 2019-09-18 NOTE — Telephone Encounter (Signed)
Patient called. She was asking if Dr. Erlinda Hong was prescribing prednisone or if she should come back for a Cortizone shot.

## 2019-09-19 NOTE — Telephone Encounter (Signed)
Let's do another dose pak.  Thanks.

## 2019-09-21 ENCOUNTER — Other Ambulatory Visit: Payer: Self-pay

## 2019-09-21 MED ORDER — METHYLPREDNISOLONE 4 MG PO TBPK: ORAL_TABLET | ORAL | 0 refills | Status: DC

## 2019-09-21 NOTE — Telephone Encounter (Signed)
Patient aware this was called into her pharmacy

## 2019-09-21 NOTE — Progress Notes (Signed)
Tabitha Moore

## 2019-09-24 ENCOUNTER — Telehealth: Payer: Self-pay | Admitting: Orthopaedic Surgery

## 2019-09-24 DIAGNOSIS — M545 Low back pain, unspecified: Secondary | ICD-10-CM

## 2019-09-24 NOTE — Telephone Encounter (Signed)
Yes please set up with [facility].  Thanks.

## 2019-09-24 NOTE — Telephone Encounter (Signed)
Ok for this? 

## 2019-09-24 NOTE — Telephone Encounter (Signed)
Patient called. She would like an injection in her back. Her call back number is [phone]

## 2019-09-25 NOTE — Telephone Encounter (Signed)
Order put in for [facility] Tried calling patient to advise. No answer. Unable to LM VM is full.

## 2019-09-29 ENCOUNTER — Telehealth: Payer: Self-pay | Admitting: Physical Medicine and Rehabilitation

## 2019-09-29 NOTE — Telephone Encounter (Signed)
Patient called requesting an appointment with Dr. Ernestina Patches as soon as possible.  She stated that her knee is hurting really bad.  CB#[phone] or [phone].  Thank you.

## 2019-09-29 NOTE — Telephone Encounter (Signed)
Documented in referral  

## 2019-09-30 ENCOUNTER — Encounter: Payer: Self-pay | Admitting: Family Medicine

## 2019-09-30 ENCOUNTER — Other Ambulatory Visit: Payer: Self-pay

## 2019-09-30 ENCOUNTER — Ambulatory Visit (INDEPENDENT_AMBULATORY_CARE_PROVIDER_SITE_OTHER): Payer: Medicare Other | Admitting: Family Medicine

## 2019-09-30 DIAGNOSIS — M25561 Pain in right knee: Secondary | ICD-10-CM | POA: Diagnosis not present

## 2019-09-30 NOTE — Progress Notes (Signed)
Office Visit Note   Patient: Tabitha Moore           Date of Birth: 1940/10/02           MRN: [id number] Visit Date: 09/30/2019 Requested by: Shon Baton, [place],  [place] [address] PCP: Shon Baton, MD  Subjective: Chief Complaint  Patient presents with  . Right Knee - Pain    Knee is sore "like a toothache" since last week. Could not flex the knee when she woke up 2 mornings ago. Epsom salt soaks and heat help some. Pain is worse in the mornings. Having ESI with Dr. Ernestina Patches tomorrow.    HPI: She is here with right knee pain. Symptoms started about 2 weeks ago, no injury. Severe pain, constant, making it hard for her to move the knee or to even sleep. In the past few days is gotten a little bit better by using Epsom salt and heat along with Tylenol. She has been dealing with right posterior hip pain and low back pain, she received an injection in her hip per Dr. Kathee Delton which helped a little bit, but is now scheduled for an epidural injection tomorrow with Dr. Ernestina Patches.  She has diabetes.               ROS: No fever or chills. All other systems were reviewed and are negative.  Objective: Vital Signs: There were no vitals taken for this visit.  Physical Exam:  General:  Alert and oriented, in no acute distress. Pulm:  Breathing unlabored. Psy:  Normal mood, congruent affect. Skin: No erythema or rash Right knee: 1+ effusion with no warmth. 1+ patellofemoral crepitus. Tender on the medial joint line, full active extension and flexion of 120 degrees.  Imaging: No results found.  Assessment & Plan: 1. Right knee pain, possibly flareup of DJD but cannot rule out referred pain from lumbar radiculopathy. -Elected to proceed with epidural injection tomorrow. She will wait a week and if her knee pain is not improved, we will inject it as well.     Procedures: No procedures performed  No notes on file     PMFS History: Patient Active Problem List    Diagnosis Date Noted  . Abnormal stress test 01/07/2019  . Nonrheumatic aortic valve insufficiency 01/07/2019  . Exertional dyspnea 11/04/2018  . Dizziness 11/04/2018  . Hallux rigidus, bilateral 05/05/2018  . High risk medication use 11/17/2016  . DDD (degenerative disc disease), lumbar 11/17/2016  . Primary osteoarthritis of both knees 11/13/2016  . Pain in left ankle and joints of left foot 11/13/2016  . Essential hypertension 10/18/2016  . Left ventricular hypertrophy 10/18/2016  . PVC's (premature ventricular contractions) 10/18/2016  . History of diabetes mellitus 10/18/2016  . History of hypothyroidism 10/18/2016  . Dyslipidemia 10/18/2016  . Obesity (BMI 35.0-39.9 without comorbidity) 10/18/2016  . History of GI diverticular bleed 10/18/2016  . Stage 4 chronic kidney disease ([place]) 10/18/2016  . 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 Internal and external prolapsed hemorrhoids, bleeding. 10/05/2011   Past Medical History:  Diagnosis Date  . Arthritis   . Complication of anesthesia    pt states "hard to wake up"  . Diabetes mellitus   . Hemorrhoids   . Hyperlipidemia   . Hypertension   . Hypothyroidism   . Retinal cyst     Family History  Problem Relation Age of Onset  . Cancer Father        lung  . Cancer  Sister        breast    Past Surgical History:  Procedure Laterality Date  . ABDOMINAL HYSTERECTOMY    . BREAST LUMPECTOMY WITH RADIOACTIVE SEED LOCALIZATION Left 08/27/2014   Procedure: BREAST LUMPECTOMY WITH RADIOACTIVE SEED LOCALIZATION;  Surgeon: Autumn Messing III, MD;  Location: [facility];  Service: General;  Laterality: Left;  . CATARACT EXTRACTION    . CHOLECYSTECTOMY     Social History   Occupational History  . Not on file  Tobacco Use  . Smoking status: Never Smoker  . Smokeless tobacco: Never Used  Vaping Use  . Vaping Use: Never used  Substance and Sexual Activity  . Alcohol use: No  . Drug use: Never  . Sexual activity:  Not on file

## 2019-10-01 ENCOUNTER — Ambulatory Visit (INDEPENDENT_AMBULATORY_CARE_PROVIDER_SITE_OTHER): Payer: Medicare Other | Admitting: Physical Medicine and Rehabilitation

## 2019-10-01 ENCOUNTER — Ambulatory Visit: Payer: Self-pay

## 2019-10-01 VITALS — BP 148/78 | HR 89

## 2019-10-01 DIAGNOSIS — M5416 Radiculopathy, lumbar region: Secondary | ICD-10-CM

## 2019-10-01 MED ORDER — METHYLPREDNISOLONE ACETATE 80 MG/ML IJ SUSP
40.0000 mg | Freq: Once | INTRAMUSCULAR | Status: AC
  Administered 2019-10-01: 40 mg

## 2019-10-01 NOTE — Progress Notes (Signed)
Pt states pain is in the right side of lower back and down the leg.Complaining of right knee pain. Saw Dr. Junius Roads who wanted to wait on injecting the knee until after injection in her back. Recently had injection in her hip(back of hip) by Dr.Deveshwar.   Pt states standing increases pain. Pt states tylenol helps with pain.  Numeric Pain Rating Scale and Functional Assessment Average Pain 8   In the last MONTH (on 0-10 scale) has pain interfered with the following?  1. General activity like being  able to carry out your everyday physical activities such as walking, climbing stairs, carrying groceries, or moving a chair?  Rating(8)   +Driver, -BT, -Dye Allergies.

## 2019-10-01 NOTE — Progress Notes (Signed)
Tabitha Moore - 79 y.o. female MRN [id number]  Date of birth: Nov 04, 1940  Office Visit Note: Visit Date: 10/01/2019 PCP: Shon Baton, MD Referred by: Shon Baton, MD  Subjective: Chief Complaint  Patient presents with   Lower Back - Pain   HPI:  Tabitha Moore is a 79 y.o. female who comes in today at the request of Dr. Eduard Roux for planned Right L4-L5 Lumbar epidural steroid injection with fluoroscopic guidance.  The patient has failed conservative care including home exercise, medications, time and activity modification.  This injection will be diagnostic and hopefully therapeutic.  Please see requesting physician notes for further details and justification.   Review of Systems  Musculoskeletal: Positive for back pain and joint pain.  All other systems reviewed and are negative.  Otherwise per HPI.  Assessment & Plan: Visit Diagnoses:  1. Lumbar radiculopathy     Plan: No additional findings.   Meds & Orders:  Meds ordered this encounter  Medications   methylPREDNISolone acetate (DEPO-MEDROL) injection 40 mg    Orders Placed This Encounter  Procedures   XR C-ARM NO REPORT   Epidural Steroid injection    Follow-up: Return for visit to requesting physician as needed.   Procedures: No procedures performed  Lumbosacral Transforaminal Epidural Steroid Injection - Sub-Pedicular Approach with Fluoroscopic Guidance  Patient: Tabitha Moore      Date of Birth: [birth year] MRN: [id number] PCP: Shon Baton, MD      Visit Date: 10/01/2019   Universal Protocol:    Date/Time: 10/01/2019  Consent Given By: the patient  Position: PRONE  Additional Comments: Vital signs were monitored before and after the procedure. Patient was prepped and draped in the usual sterile fashion. The correct patient, procedure, and site was verified.   Injection Procedure Details:  Procedure Site One Meds Administered:  Meds ordered this encounter  Medications    methylPREDNISolone acetate (DEPO-MEDROL) injection 40 mg    Laterality: Right  Location/Site:  L4-L5  Needle size: 2 G  Needle type: Spinal  Needle Placement: Transforaminal  Findings:    -Comments: Excellent flow of contrast along the nerve, nerve root and into the epidural space.  Procedure Details: After squaring off the end-plates to get a true AP view, the C-arm was positioned so that an oblique view of the foramen as noted above was visualized. The target area is just inferior to the "nose of the scotty dog" or sub pedicular. The soft tissues overlying this structure were infiltrated with 2-3 ml. of 1% Lidocaine without Epinephrine.  The spinal needle was inserted toward the target using a "trajectory" view along the fluoroscope beam.  Under AP and lateral visualization, the needle was advanced so it did not puncture dura and was located close the 6 O'Clock position of the pedical in AP tracterory. Biplanar projections were used to confirm position. Aspiration was confirmed to be negative for CSF and/or blood. A 1-2 ml. volume of Isovue-250 was injected and flow of contrast was noted at each level. Radiographs were obtained for documentation purposes.   After attaining the desired flow of contrast documented above, a 0.5 to 1.0 ml test dose of 0.25% Marcaine was injected into each respective transforaminal space.  The patient was observed for 90 seconds post injection.  After no sensory deficits were reported, and normal lower extremity motor function was noted,   the above injectate was administered so that equal amounts of the injectate were placed at each foramen (level) into the transforaminal epidural  space.   Additional Comments:  The patient tolerated the procedure well Dressing: 2 x 2 sterile gauze and Band-Aid    Post-procedure details: Patient was observed during the procedure. Post-procedure instructions were reviewed.  Patient left the clinic in stable  condition.      Clinical History: Narrative & Impression CLINICAL DATA:  Low back pain for 4 5 months. Bilateral leg pain, left greater than right.  EXAM: MRI LUMBAR SPINE WITHOUT CONTRAST  TECHNIQUE: Multiplanar, multisequence MR imaging of the lumbar spine was performed. No intravenous contrast was administered.  COMPARISON:  None.  FINDINGS: Segmentation: 5 lumbar type vertebral bodies. The last full intervertebral disc space is labeled L5-S1.  Alignment: Normal overall alignment. There is mild multilevel degenerative spondylolisthesis.  Vertebrae: Normal marrow signal. No bone lesions or fracture. Mild endplate reactive changes at L5-S1.  Conus medullaris: Extends to the bottom of L1 level and appears normal.  Paraspinal and other soft tissues: No significant findings.  Disc levels:  T12-L1: Moderate disc disease and mild facet disease. There is mild mass effect on the left side of the thecal sac with left lateral recess encroachment but no significant spinal stenosis. Mild bilateral foraminal encroachment.  L1-2: Mild diffuse annular bulge and osteophytic ridging with mild bilateral lateral recess encroachment and mild bilateral foraminal encroachment. No significant spinal stenosis.  L2-3: Moderate disc disease and facet disease with a bulging degenerated annulus and osteophytic ridging. There is mild bilateral lateral recess stenosis and mild bilateral foraminal encroachment. Early spinal stenosis.  L3-4: Degenerated bulging uncovered disc, osteophytic ridging, short pedicles and facet disease contributing to mild spinal stenosis and moderate right greater than left lateral recess stenosis. Mild foraminal stenosis bilaterally, left greater than right due to a shallow broad-based disc protrusion.  L4-5 bulging uncovered disc, osteophytic ridging, short pedicles and advanced facet disease contributing to moderate to moderately severe spinal  and bilateral lateral recess stenosis. There is also mild right foraminal stenosis due to a shallow foraminal/extraforaminal disc protrusion.  L5-S1: Severe facet disease and moderate to advanced disc disease. No significant spinal or lateral recess stenosis. Mild bilateral foraminal stenosis, right greater than left.  IMPRESSION: 1. 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 2. Multilevel multifactorial spinal, lateral recess and foraminal stenosis as discussed above at the individual levels. These findings are most significant at L4-5.   Electronically Signed   By: Marijo Sanes M.D.   On: 08/24/2016 14:51     Objective:  VS:  HT:     WT:    BMI:      BP:(!) 148/78   HR:89bpm   TEMP: ( )   RESP:  Physical Exam Constitutional:      General: She is not in acute distress.    Appearance: Normal appearance. She is not ill-appearing.  HENT:     Head: Normocephalic and atraumatic.     Right Ear: External ear normal.     Left Ear: External ear normal.  Eyes:     Extraocular Movements: Extraocular movements intact.  Cardiovascular:     Rate and Rhythm: Normal rate.     Pulses: Normal pulses.  Musculoskeletal:     Right lower leg: No edema.     Left lower leg: No edema.     Comments: Patient has good distal strength with no pain over the greater trochanters.  No clonus or focal weakness.  Skin:    Findings: No erythema, lesion or rash.  Neurological:     General: No  focal deficit present.     Mental Status: She is alert and oriented to person, place, and time.     Sensory: No sensory deficit.     Motor: No weakness or abnormal muscle tone.     Coordination: Coordination normal.  Psychiatric:        Mood and Affect: Mood normal.        Behavior: Behavior normal.      Imaging: No results found.

## 2019-10-01 NOTE — Procedures (Signed)
Lumbosacral Transforaminal Epidural Steroid Injection - Sub-Pedicular Approach with Fluoroscopic Guidance  Patient: Tabitha Moore      Date of Birth: 12/16/1940 MRN: [id number] PCP: Shon Baton, MD      Visit Date: 10/01/2019   Universal Protocol:    Date/Time: 10/01/2019  Consent Given By: the patient  Position: PRONE  Additional Comments: Vital signs were monitored before and after the procedure. Patient was prepped and draped in the usual sterile fashion. The correct patient, procedure, and site was verified.   Injection Procedure Details:  Procedure Site One Meds Administered:  Meds ordered this encounter  Medications  . methylPREDNISolone acetate (DEPO-MEDROL) injection 40 mg    Laterality: Right  Location/Site:  L4-L5  Needle size: 22 G  Needle type: Spinal  Needle Placement: Transforaminal  Findings:    -Comments: Excellent flow of contrast along the nerve, nerve root and into the epidural space.  Procedure Details: After squaring off the end-plates to get a true AP view, the C-arm was positioned so that an oblique view of the foramen as noted above was visualized. The target area is just inferior to the "nose of the scotty dog" or sub pedicular. The soft tissues overlying this structure were infiltrated with 2-3 ml. of 1% Lidocaine without Epinephrine.  The spinal needle was inserted toward the target using a "trajectory" view along the fluoroscope beam.  Under AP and lateral visualization, the needle was advanced so it did not puncture dura and was located close the 6 O'Clock position of the pedical in AP tracterory. Biplanar projections were used to confirm position. Aspiration was confirmed to be negative for CSF and/or blood. A 1-2 ml. volume of Isovue-250 was injected and flow of contrast was noted at each level. Radiographs were obtained for documentation purposes.   After attaining the desired flow of contrast documented above, a 0.5 to 1.0 ml test dose  of 0.25% Marcaine was injected into each respective transforaminal space.  The patient was observed for 90 seconds post injection.  After no sensory deficits were reported, and normal lower extremity motor function was noted,   the above injectate was administered so that equal amounts of the injectate were placed at each foramen (level) into the transforaminal epidural space.   Additional Comments:  The patient tolerated the procedure well Dressing: 2 x 2 sterile gauze and Band-Aid    Post-procedure details: Patient was observed during the procedure. Post-procedure instructions were reviewed.  Patient left the clinic in stable condition.

## 2019-10-14 ENCOUNTER — Ambulatory Visit: Payer: Medicare Other | Admitting: Specialist

## 2019-10-23 DIAGNOSIS — Z961 Presence of intraocular lens: Secondary | ICD-10-CM | POA: Diagnosis not present

## 2019-10-23 DIAGNOSIS — Z79899 Other long term (current) drug therapy: Secondary | ICD-10-CM | POA: Diagnosis not present

## 2019-10-23 DIAGNOSIS — E113313 Type 2 diabetes mellitus with moderate nonproliferative diabetic retinopathy with macular edema, bilateral: Secondary | ICD-10-CM | POA: Diagnosis not present

## 2019-10-23 DIAGNOSIS — Z794 Long term (current) use of insulin: Secondary | ICD-10-CM | POA: Diagnosis not present

## 2019-10-23 DIAGNOSIS — H26492 Other secondary cataract, left eye: Secondary | ICD-10-CM | POA: Diagnosis not present

## 2019-10-23 DIAGNOSIS — H3581 Retinal edema: Secondary | ICD-10-CM | POA: Diagnosis not present

## 2019-10-23 DIAGNOSIS — H30033 Focal chorioretinal inflammation, peripheral, bilateral: Secondary | ICD-10-CM | POA: Diagnosis not present

## 2019-10-30 DIAGNOSIS — Z79899 Other long term (current) drug therapy: Secondary | ICD-10-CM | POA: Diagnosis not present

## 2019-10-30 DIAGNOSIS — H30033 Focal chorioretinal inflammation, peripheral, bilateral: Secondary | ICD-10-CM | POA: Diagnosis not present

## 2019-11-02 ENCOUNTER — Encounter: Payer: Medicare Other | Admitting: Physical Medicine and Rehabilitation

## 2019-11-03 ENCOUNTER — Encounter (HOSPITAL_COMMUNITY): Payer: Self-pay

## 2019-11-03 ENCOUNTER — Other Ambulatory Visit: Payer: Self-pay

## 2019-11-03 ENCOUNTER — Ambulatory Visit (HOSPITAL_COMMUNITY)
Admission: EM | Admit: 2019-11-03 | Discharge: 2019-11-03 | Disposition: A | Discharge status: Home / Self Care | Payer: Medicare Other | Attending: Physician Assistant | Admitting: Physician Assistant

## 2019-11-03 DIAGNOSIS — R600 Localized edema: Secondary | ICD-10-CM | POA: Diagnosis not present

## 2019-11-03 DIAGNOSIS — T24131A Burn of first degree of right lower leg, initial encounter: Secondary | ICD-10-CM | POA: Diagnosis not present

## 2019-11-03 DIAGNOSIS — M25571 Pain in right ankle and joints of right foot: Secondary | ICD-10-CM

## 2019-11-03 MED ORDER — FUROSEMIDE 20 MG PO TABS: 20.0000 mg | ORAL_TABLET | Freq: Every day | ORAL | 0 refills | Status: AC

## 2019-11-03 NOTE — Discharge Instructions (Signed)
Take the fluid pill daily for up to 5 days, this may make you urinate a bit more often  Apply soothing creams like aloe to the burn  Call your primary care for follow up  Follow up with your orthopedist as schedule and discuss your ankle   You may take tylenol as previously prescribed, regular strength tablets  If acutely worsening over next 2-3 days return

## 2019-11-03 NOTE — ED Triage Notes (Signed)
Pt presents to UC for right ankle pain, swelling and redness. Pt concerned for possible bug bite? Pt states ankle has been swollen and tight feeling since Sunday. Pt has been treating with hot water soaks, and possible burnt leg from too hot water. Pt has been treating with tylenol with out relief.  Upon assessment right ankle found to be swollen, and red. Pt noted to have full ROM. Pt ambulated into treatment space with even steady gait.

## 2019-11-03 NOTE — ED Provider Notes (Signed)
[facility]    CSN: [id number] Arrival date & time: 11/03/19  6712      History   Chief Complaint Chief Complaint  Patient presents with  . Ankle Pain    HPI Tabitha Moore is a 79 y.o. female.   Patient reports for right ankle pain and swelling.  Patient states the right ankle has been bothering her for 2 to 3 weeks.  She reports the pain is in the front of the ankle spreading up her shin.  She reports sometimes it hurts with walking.  She reports she has tried different things for this and most recently tried soaking in hot water.  She attempted soaking in hot water over the weekend which is 2 to 3 days ago when she believes she burned the skin in the front of the ankle and lower leg.  She reports this caused little more pain.  She does report that she has had some swelling in both feet and ankles on and off.  She has had swelling in her ankles and legs before, but states this had been an issue for a while.  She believes that ankle became a more painful when the swelling was worse.  She denies a history of injuring the ankle.  She has not had any fevers or chills.  She reports redness only showed up after burning the skin in the hot water.  Denies history of gout.  She reports she has a follow-up with orthopedist 11/06/2019 for her knee issues.  Denies any chest pains or shortness of breath.  Compliant with all her medicines.        Past Medical History:  Diagnosis Date  . Arthritis   . Complication of anesthesia    pt states "hard to wake up"  . Diabetes mellitus   . Hemorrhoids   . Hyperlipidemia   . Hypertension   . Hypothyroidism   . Retinal cyst     Patient Active Problem List   Diagnosis Date Noted  . Abnormal stress test 01/07/2019  . Nonrheumatic aortic valve insufficiency 01/07/2019  . Exertional dyspnea 11/04/2018  . Dizziness 11/04/2018  . Hallux rigidus, bilateral 05/05/2018  . High risk medication use 11/17/2016  . DDD (degenerative disc  disease), lumbar 11/17/2016  . Primary osteoarthritis of both knees 11/13/2016  . Pain in left ankle and joints of left foot 11/13/2016  . Essential hypertension 10/18/2016  . Left ventricular hypertrophy 10/18/2016  . PVC's (premature ventricular contractions) 10/18/2016  . History of diabetes mellitus 10/18/2016  . History of hypothyroidism 10/18/2016  . Dyslipidemia 10/18/2016  . Obesity (BMI 35.0-39.9 without comorbidity) 10/18/2016  . History of GI diverticular bleed 10/18/2016  . Stage 4 chronic kidney disease ([place]) 10/18/2016  . 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 Internal and external prolapsed hemorrhoids, bleeding. 10/05/2011    Past Surgical History:  Procedure Laterality Date  . ABDOMINAL HYSTERECTOMY    . BREAST LUMPECTOMY WITH RADIOACTIVE SEED LOCALIZATION Left 08/27/2014   Procedure: BREAST LUMPECTOMY WITH RADIOACTIVE SEED LOCALIZATION;  Surgeon: Autumn Messing III, MD;  Location: [facility];  Service: General;  Laterality: Left;  . CATARACT EXTRACTION    . CHOLECYSTECTOMY      OB History   No obstetric history on file.      Home Medications    Prior to Admission medications   Medication Sig Start Date End Date Taking? Authorizing Provider  acetaminophen (TYLENOL) 325 MG tablet Take 650 mg by mouth every 6 (six) hours  as needed for mild pain.    [provider]  acetaminophen (TYLENOL) 500 MG tablet Take 1,000 mg by mouth every 6 (six) hours as needed for mild pain.    [provider]  amLODipine (NORVASC) 5 MG tablet Take 5 mg by mouth daily.     [provider]  azaTHIOprine (IMURAN) 50 MG tablet Take by mouth. 07/24/19   [provider]  Bacillus Coagulans-Inulin (PROBIOTIC FORMULA PO) Take 1 tablet by mouth daily.     [provider]  BENICAR 40 MG tablet Take 40 mg by mouth daily.  09/12/11   [provider]  Cholecalciferol (VITAMIN D PO) Take 1,000 mg by mouth daily.     [provider]  CRESTOR 5 MG tablet Take 5 mg by mouth every morning.  09/21/11   [provider]  furosemide (LASIX) 20 MG tablet Take 1 tablet (20 mg total) by mouth daily for 5 days. 11/03/19 11/08/19  Lashunda Greis, Marguerita Beards, PA-C  gabapentin (NEURONTIN) 100 MG capsule Take 100 mg by mouth at bedtime. 09/24/19   [provider]  Ginger, Zingiber officinalis, (GINGER EXTRACT PO) Take 1 tablet by mouth daily.     [provider]  glimepiride (AMARYL) 4 MG tablet Take 4 mg by mouth daily. 07/06/11   [provider]  levothyroxine (SYNTHROID, LEVOTHROID) 88 MCG tablet Take 88 mcg by mouth daily.  10/01/11   [provider]  metFORMIN (GLUCOPHAGE) 500 MG tablet Take 500 mg by mouth every morning.  03/03/18   [provider]  methylPREDNISolone (MEDROL DOSEPAK) 4 MG TBPK tablet Take as directed on packet instructions 09/21/19   Leandrew Koyanagi, MD  omega-3 acid ethyl esters (LOVAZA) 1 g capsule Take 1 g by mouth daily.    [provider]  omeprazole (PRILOSEC) 40 MG capsule Take 40 mg by mouth daily.  02/03/18   [provider]  psyllium (HYDROCIL/METAMUCIL) 95 % PACK Take 1 packet by mouth daily as needed for mild constipation.    [provider]  rosuvastatin (CRESTOR) 10 MG tablet Take 10 mg by mouth at bedtime. 06/26/19   [provider]  Turmeric 400 MG CAPS Take 1 capsule by mouth daily.     [provider]    Family History Family History  Problem Relation Age of Onset  . Cancer Father        lung  . Cancer Sister        breast    Social History Social History   Tobacco Use  . Smoking status: Never Smoker  . Smokeless tobacco: Never Used  Vaping Use  . Vaping Use: Never used  Substance Use Topics  . Alcohol use: No  . Drug use: Never     Allergies   Shellfish allergy   Review of Systems Review of Systems   Physical Exam Triage Vital Signs ED Triage Vitals [11/03/19 0908]  Enc Vitals  Group     BP (!) 154/93     Pulse Rate 104     Resp 16     Temp 98 F (36.7 C)     Temp Source Oral     SpO2 97 %     Weight      Height      Head Circumference      Peak Flow      Pain Score 0     Pain Loc      Pain Edu?  Excl. in [place]?    No data found.  Updated Vital Signs BP (!) 154/93 (BP Location: Right Arm)   Pulse 104   Temp 98 F (36.7 C) (Oral)   Resp 16   SpO2 97%   Visual Acuity Right Eye Distance:   Left Eye Distance:   Bilateral Distance:    Right Eye Near:   Left Eye Near:    Bilateral Near:     Physical Exam Vitals and nursing note reviewed.  Constitutional:      Appearance: Normal appearance.  Cardiovascular:     Rate and Rhythm: Normal rate.  Pulmonary:     Effort: Pulmonary effort is normal. No respiratory distress.  Musculoskeletal:     Comments: Right lower extremity: Trace to 1+ edema to mid shin.  There is some overlying mild erythema with poorly defined borders in the area patient stated that she burned with hot water.  Mild warmth, tender to firm palpation.  Full range of motion of the ankle without issue.  Patient is able to bear weight without issue.  No bony tenderness of the ankle.  Distal pulses intact.  Cap refill less than 2 seconds sensation intact  Left lower extremity: Trace to 1+ edema to mid shin.  Skin:    General: Skin is warm and dry.     Capillary Refill: Capillary refill takes less than 2 seconds.  Neurological:     Mental Status: She is alert.      UC Treatments / Results  Labs (all labs ordered are listed, but only abnormal results are displayed) Labs Reviewed - No data to display  EKG   Radiology No results found.  Procedures Procedures (including critical care time)  Medications Ordered in UC Medications - No data to display  Initial Impression / Assessment and Plan / UC Course  I have reviewed the triage vital signs and the nursing notes.  Pertinent labs & imaging results that were available  during my care of the patient were reviewed by me and considered in my medical decision making (see chart for details).    #Right ankle pain #Superficial burn #Lower leg edema Patient is a 79 year old with numerous chronic pains presenting with right ankle pain, lower leg swelling and a superficial burn of the right lower leg.  Believe her pain is likely secondary to chronic changes in the ankle, however exacerbated by pressure from lower leg edema now also acutely from superficial burn on the right lower leg.  Doubt infection.  Discussed the patient's case with attending physician Dr. Mannie Stabile.  Given likely that edema has contributing, we felt that patient may benefit from slight diuresis.  Short course of 20 mg of Lasix daily for 5 days maximum.  Instructed to elevate her feet.  Instructed to utilize soothing aloe creams and Tylenol for pain.  Instructed to follow-up with orthopedist.  Instructed to follow-up with primary care to discuss lower leg swelling.  Strict return emergency department precautions were discussed.  Patient verbalized understanding plan of care Final Clinical Impressions(s) / UC Diagnoses   Final diagnoses:  Superficial burn of right lower leg, initial encounter  Lower leg edema     Discharge Instructions     Take the fluid pill daily for up to 5 days, this may make you urinate a bit more often  Apply soothing creams like aloe to the burn  Call your primary care for follow up  Follow up with your orthopedist as schedule and discuss your ankle  You may take tylenol as previously prescribed, regular strength tablets  If acutely worsening over next 2-3 days return      ED Prescriptions    Medication Sig Dispense Auth. Provider   furosemide (LASIX) 20 MG tablet Take 1 tablet (20 mg total) by mouth daily for 5 days. 5 tablet Geremiah Fussell, Marguerita Beards, PA-C     PDMP not reviewed this encounter.   Purnell Shoemaker, PA-C 11/03/19 2353

## 2019-11-06 ENCOUNTER — Ambulatory Visit (INDEPENDENT_AMBULATORY_CARE_PROVIDER_SITE_OTHER): Payer: Medicare Other

## 2019-11-06 ENCOUNTER — Ambulatory Visit (INDEPENDENT_AMBULATORY_CARE_PROVIDER_SITE_OTHER): Payer: Medicare Other | Admitting: Family Medicine

## 2019-11-06 ENCOUNTER — Encounter: Payer: Self-pay | Admitting: Family Medicine

## 2019-11-06 ENCOUNTER — Other Ambulatory Visit: Payer: Self-pay

## 2019-11-06 DIAGNOSIS — M25571 Pain in right ankle and joints of right foot: Secondary | ICD-10-CM | POA: Diagnosis not present

## 2019-11-06 MED ORDER — DOXYCYCLINE HYCLATE 100 MG PO CAPS: 100.0000 mg | ORAL_CAPSULE | Freq: Two times a day (BID) | ORAL | 0 refills | Status: AC

## 2019-11-06 NOTE — Progress Notes (Signed)
Office Visit Note   Patient: Tabitha Moore           Date of Birth: 07/09/1940           MRN: [id number] Visit Date: 11/06/2019 Requested by: Shon Baton, [address],  [place] [id number] PCP: Shon Baton, MD  Subjective: Chief Complaint  Patient presents with  . Right Ankle - Pain    Pain and swelling x approximately 2 weeks. NKI. Went to [facility] 11/03/19. Pain is mainly anterior ankle/shin area.    HPI: Here with right ankle swelling and pain.  Symptoms started 2 weeks ago, no injury.  She recalls picking blueberries and noticing a small area of blood on the lateral ankle.  She thinks it might have been a bug bite but it did not hurt.  Her ankle gradually started swelling and has become painful as well.  Pain is in the anterior aspect of the lower leg and ankle.  She went to urgent care a few days ago was given furosemide.  It does not seem to have helped with her pain.  No fevers or chills.              ROS:   All other systems were reviewed and are negative.  Objective: Vital Signs: There were no vitals taken for this visit.  Physical Exam:  General:  Alert and oriented, in no acute distress. Pulm:  Breathing unlabored. Psy:  Normal mood, congruent affect. Skin: There is a small scab on the lateral ankle.  This was carefully removed.  There is a small ulceration underneath but no pus.  I did not see any definite tick. Right ankle has soft tissue swelling anterior laterally with some erythema and slight warmth.  I do not think she has a joint effusion.  Imaging: XR Ankle Complete Right  Result Date: 11/06/2019 X-rays of the right ankle show no significant degenerative change, no acute bony abnormality.   Assessment & Plan: 1.  Right ankle pain and swelling, question insect bite with cellulitis -We will treat with doxycycline.  If not improved by next week, she will contact me and we might consider steroid injection.     Procedures: No procedures  performed  No notes on file     PMFS History: Patient Active Problem List   Diagnosis Date Noted  . Abnormal stress test 01/07/2019  . Nonrheumatic aortic valve insufficiency 01/07/2019  . Exertional dyspnea 11/04/2018  . Dizziness 11/04/2018  . Hallux rigidus, bilateral 05/05/2018  . High risk medication use 11/17/2016  . DDD (degenerative disc disease), lumbar 11/17/2016  . Primary osteoarthritis of both knees 11/13/2016  . Pain in left ankle and joints of left foot 11/13/2016  . Essential hypertension 10/18/2016  . Left ventricular hypertrophy 10/18/2016  . PVC's (premature ventricular contractions) 10/18/2016  . History of diabetes mellitus 10/18/2016  . History of hypothyroidism 10/18/2016  . Dyslipidemia 10/18/2016  . Obesity (BMI 35.0-39.9 without comorbidity) 10/18/2016  . History of GI diverticular bleed 10/18/2016  . Stage 4 chronic kidney disease ([place]) 10/18/2016  . 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 Internal and external prolapsed hemorrhoids, bleeding. 10/05/2011   Past Medical History:  Diagnosis Date  . Arthritis   . Complication of anesthesia    pt states "hard to wake up"  . Diabetes mellitus   . Hemorrhoids   . Hyperlipidemia   . Hypertension   . Hypothyroidism   . Retinal cyst     Family History  Problem Relation Age of Onset  . Cancer Father        lung  . Cancer Sister        breast    Past Surgical History:  Procedure Laterality Date  . ABDOMINAL HYSTERECTOMY    . BREAST LUMPECTOMY WITH RADIOACTIVE SEED LOCALIZATION Left 08/27/2014   Procedure: BREAST LUMPECTOMY WITH RADIOACTIVE SEED LOCALIZATION;  Surgeon: Autumn Messing III, MD;  Location: [facility];  Service: General;  Laterality: Left;  . CATARACT EXTRACTION    . CHOLECYSTECTOMY     Social History   Occupational History  . Not on file  Tobacco Use  . Smoking status: Never Smoker  . Smokeless tobacco: Never Used  Vaping Use  . Vaping Use: Never used  Substance  and Sexual Activity  . Alcohol use: No  . Drug use: Never  . Sexual activity: Not on file

## 2019-11-10 ENCOUNTER — Ambulatory Visit: Payer: Medicare Other | Admitting: Orthopaedic Surgery

## 2019-11-17 DIAGNOSIS — N1831 Chronic kidney disease, stage 3a: Secondary | ICD-10-CM | POA: Diagnosis not present

## 2019-11-17 DIAGNOSIS — I129 Hypertensive chronic kidney disease with stage 1 through stage 4 chronic kidney disease, or unspecified chronic kidney disease: Secondary | ICD-10-CM | POA: Diagnosis not present

## 2019-11-17 DIAGNOSIS — R2241 Localized swelling, mass and lump, right lower limb: Secondary | ICD-10-CM | POA: Diagnosis not present

## 2019-12-02 IMAGING — MR MR ANKLE*R* W/O CM
4 of 5 series · 19 of 40 positions shown · non-contrast
Comparison: None.

CLINICAL DATA: Right ankle pain and swelling for 2 months. No known
injury.

EXAM:
MRI OF THE RIGHT ANKLE WITHOUT CONTRAST
TECHNIQUE: Multiplanar, multisequence MR imaging of the ankle was performed. No
intravenous contrast was administered.

[Series 3: T2 fat-sat · axial · 3.0mm · 0.31mm/px · z∈[-74,+68]mm · 5 of 41 slices shown (1 of 2)]
[im 1/41]
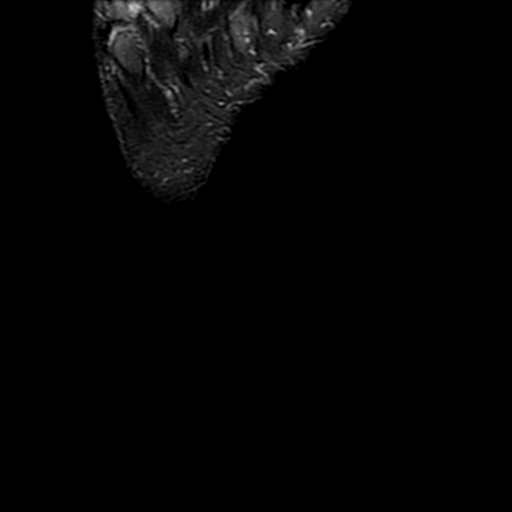
[im 5/41]
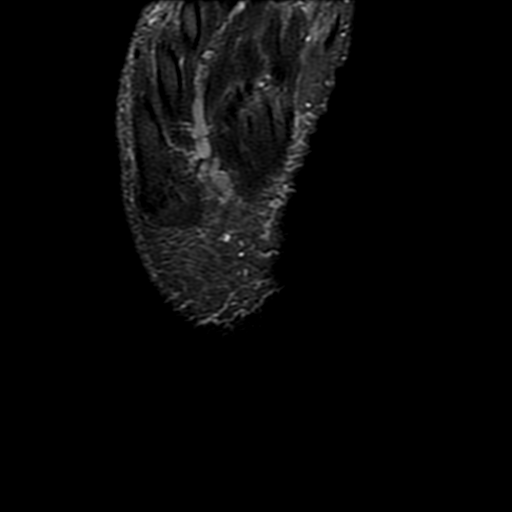
[im 9/41]
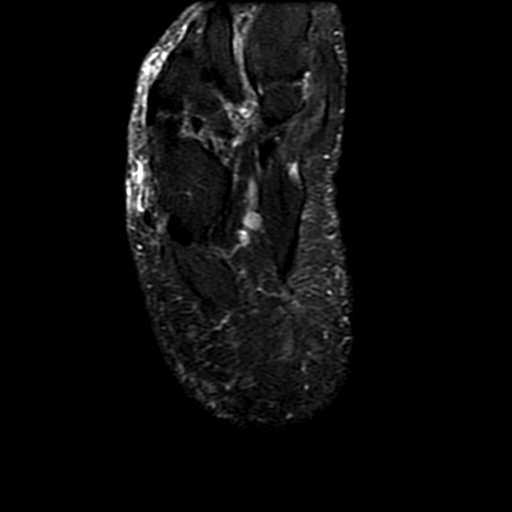
[im 21/41]
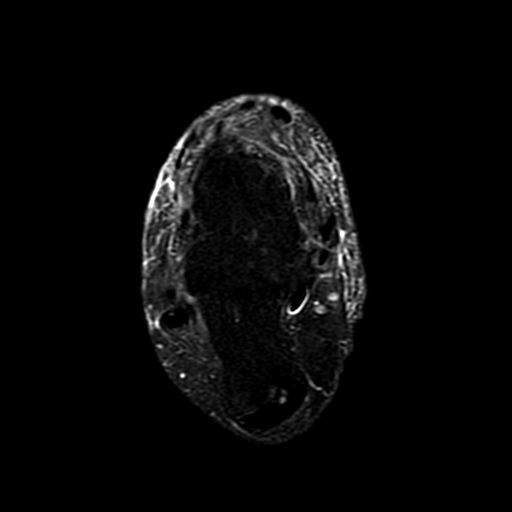
[im 37/41]
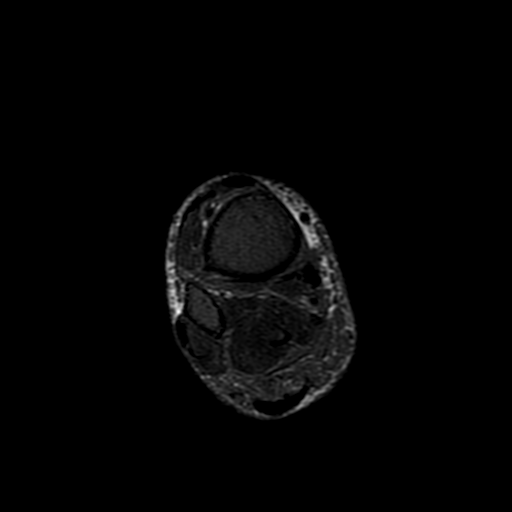

[Series 4: PD fat-sat · axial · 3.0mm · 0.31mm/px · z∈[-74,+83]mm · 8 of 41 slices shown]
[im 1/41]
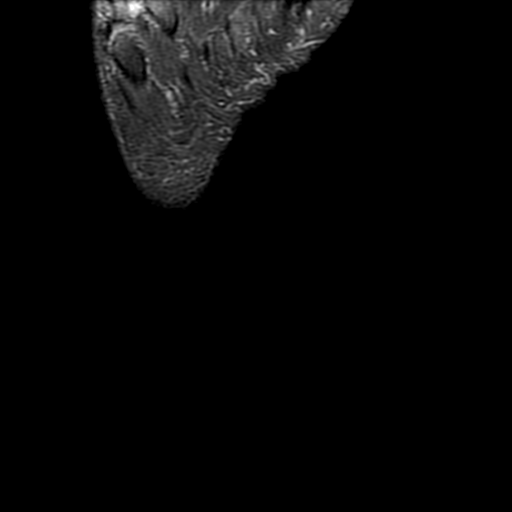
[im 5/41]
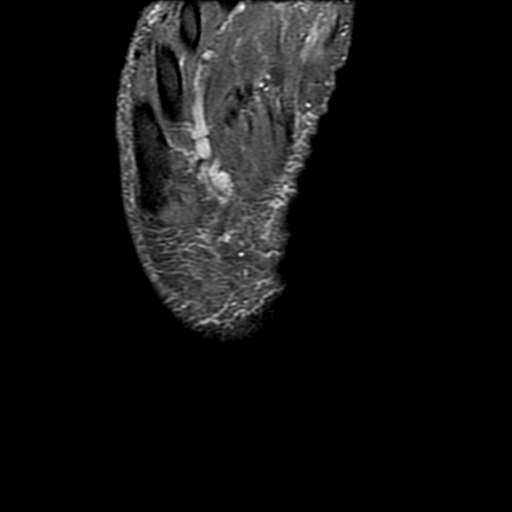
[im 14/41]
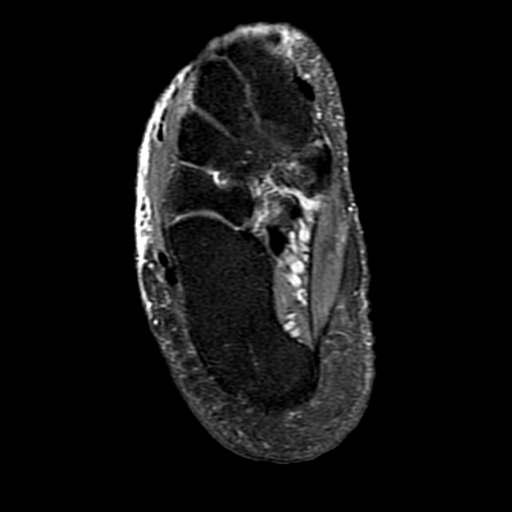
[im 18/41]
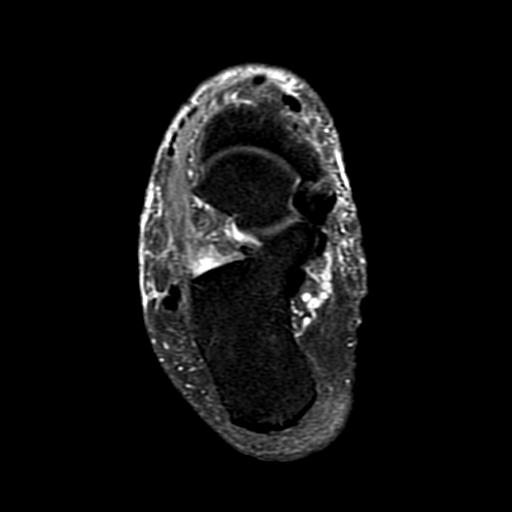
[im 23/41]
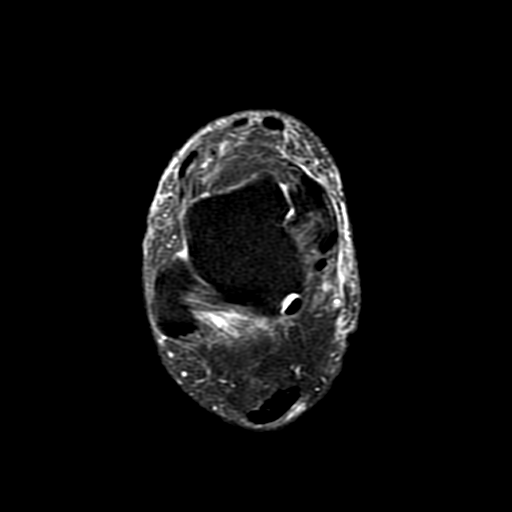
[im 27/41]
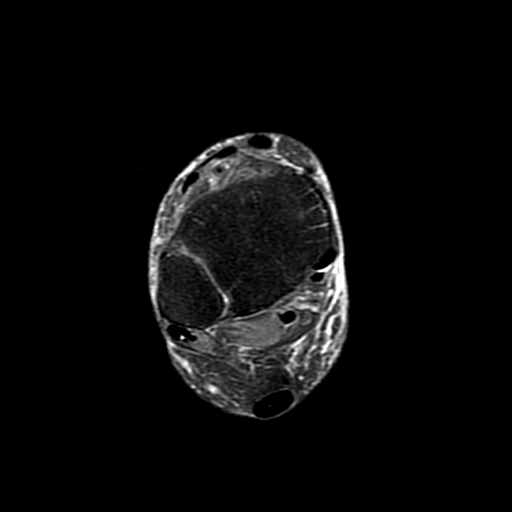
[im 36/41]
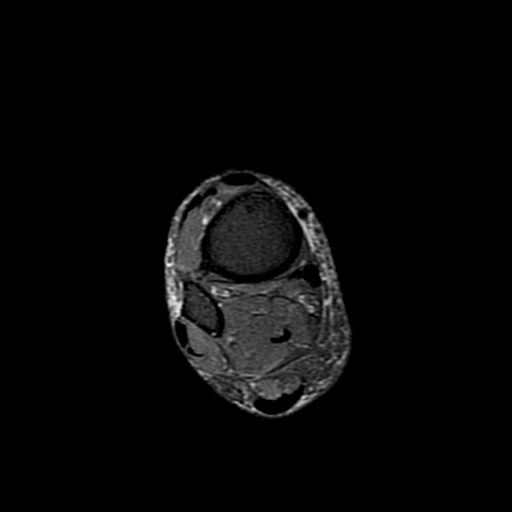
[im 41/41]
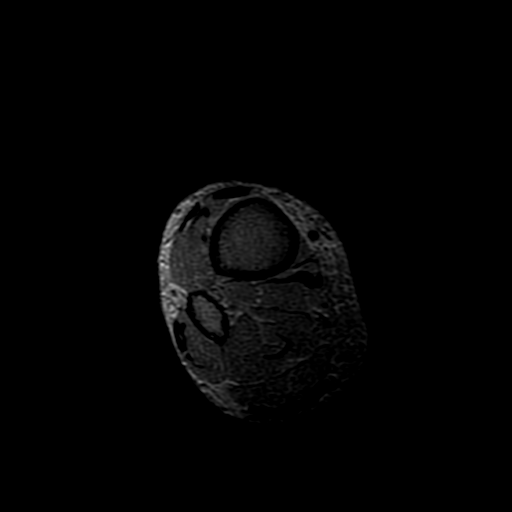

[Series 5: T2 fat-sat · coronal · 3.0mm · 0.35mm/px · 3 of 36 slices shown (2 of 2)]
[im 5/36]
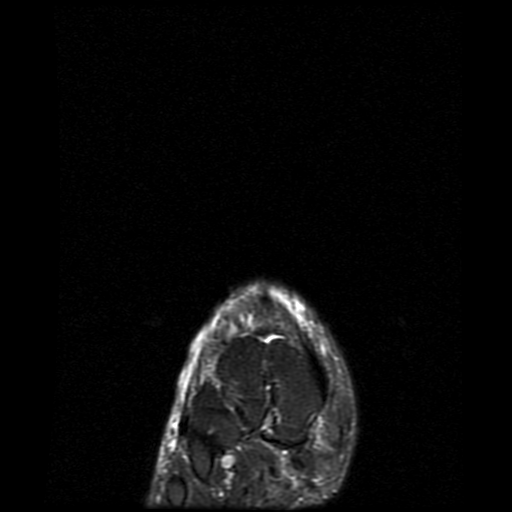
[im 18/36]
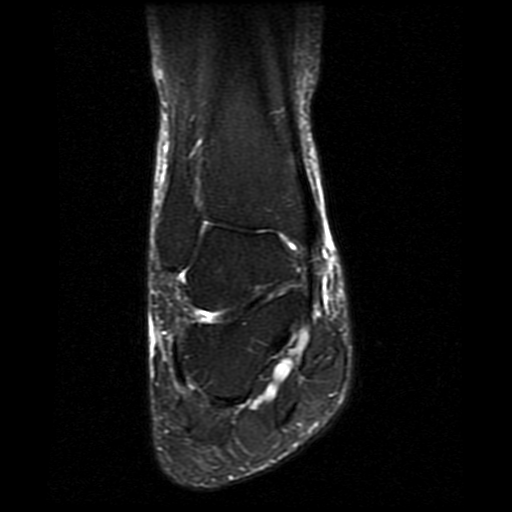
[im 31/36]
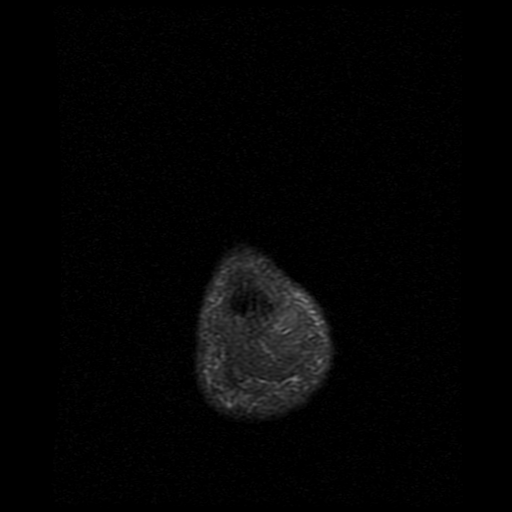

[Series 6: T1 · sagittal · 4.0mm · 0.33mm/px · 3 of 21 slices shown]
[im 1/21]
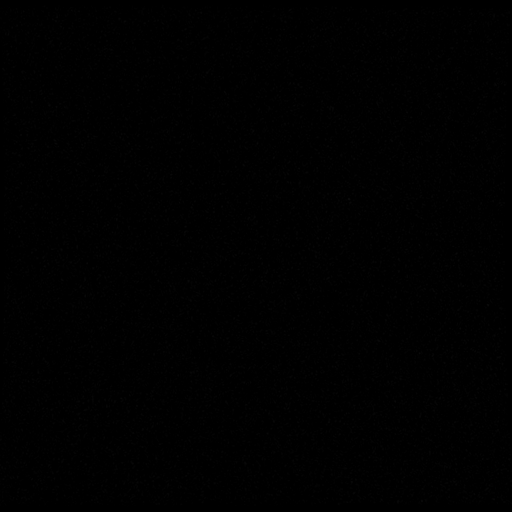
[im 11/21]
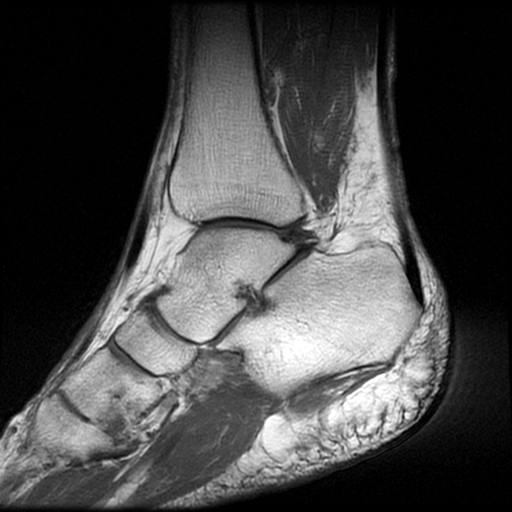
[im 21/21]
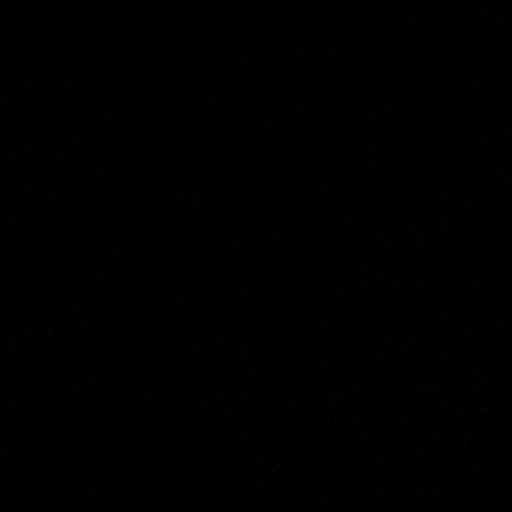

[19 of 40 positions shown; findings below may reference images not displayed]

FINDINGS: TENDONS

Peroneal: Peroneal longus tendon intact. Peroneal brevis intact.

Posteromedial: Posterior tibial tendon intact. Flexor hallucis
longus tendon intact. Flexor digitorum longus tendon intact.

Anterior: Tibialis anterior tendon intact. Extensor hallucis longus
tendon intact Extensor digitorum longus tendon intact.

Achilles: Mild tendinosis of the Achilles tendon just proximal to
the insertion.

Plantar Fascia: Intact.

LIGAMENTS

Lateral: Attenuated anterior talofibular ligament likely reflecting
prior injury. Calcaneofibular ligament intact. Posterior talofibular
ligament intact. Anterior and posterior tibiofibular ligaments
intact.

Medial: Deltoid ligament intact. Spring ligament intact.

CARTILAGE

Ankle Joint: No joint effusion. Normal ankle mortise. No chondral
defect.

Subtalar Joints/Sinus Tarsi: Normal subtalar joints. No subtalar
joint effusion. Normal sinus tarsi.

Bones: No marrow signal abnormality.  No fracture or dislocation.

Soft Tissue: No soft tissue mass or fluid collection. Muscles are
normal. Mild soft tissue edema along the dorsal lateral aspect of
the foot.
IMPRESSION: 1. Mild tendinosis of the Achilles tendon just proximal to the
insertion.
2. Attenuated anterior talofibular ligament likely reflecting prior
injury.

## 2019-12-15 ENCOUNTER — Other Ambulatory Visit: Payer: Medicare Other

## 2019-12-21 ENCOUNTER — Ambulatory Visit: Payer: Medicare Other

## 2019-12-21 ENCOUNTER — Other Ambulatory Visit: Payer: Self-pay

## 2019-12-21 DIAGNOSIS — I351 Nonrheumatic aortic (valve) insufficiency: Secondary | ICD-10-CM | POA: Diagnosis not present

## 2020-01-07 ENCOUNTER — Ambulatory Visit: Payer: Medicare Other | Admitting: Cardiology

## 2020-01-13 DIAGNOSIS — Z23 Encounter for immunization: Secondary | ICD-10-CM | POA: Diagnosis not present

## 2020-01-19 DIAGNOSIS — Z79899 Other long term (current) drug therapy: Secondary | ICD-10-CM | POA: Diagnosis not present

## 2020-01-19 DIAGNOSIS — H30033 Focal chorioretinal inflammation, peripheral, bilateral: Secondary | ICD-10-CM | POA: Diagnosis not present

## 2020-01-19 DIAGNOSIS — Z961 Presence of intraocular lens: Secondary | ICD-10-CM | POA: Diagnosis not present

## 2020-01-19 DIAGNOSIS — H3581 Retinal edema: Secondary | ICD-10-CM | POA: Diagnosis not present

## 2020-01-19 DIAGNOSIS — E113313 Type 2 diabetes mellitus with moderate nonproliferative diabetic retinopathy with macular edema, bilateral: Secondary | ICD-10-CM | POA: Diagnosis not present

## 2020-01-19 DIAGNOSIS — Z794 Long term (current) use of insulin: Secondary | ICD-10-CM | POA: Diagnosis not present

## 2020-01-19 DIAGNOSIS — H26492 Other secondary cataract, left eye: Secondary | ICD-10-CM | POA: Diagnosis not present

## 2020-01-26 ENCOUNTER — Telehealth: Payer: Self-pay | Admitting: Physical Medicine and Rehabilitation

## 2020-01-26 DIAGNOSIS — Z79899 Other long term (current) drug therapy: Secondary | ICD-10-CM | POA: Diagnosis not present

## 2020-01-26 DIAGNOSIS — H30033 Focal chorioretinal inflammation, peripheral, bilateral: Secondary | ICD-10-CM | POA: Diagnosis not present

## 2020-01-26 NOTE — Telephone Encounter (Signed)
Patient had right L4 TF on 10/01/2019. Ok to repeat if helped, same problem/side, and no new injury?

## 2020-01-26 NOTE — Telephone Encounter (Signed)
Patient called and would like to set appt for back injections. Please call patient about this matter at [phone].

## 2020-01-26 NOTE — Telephone Encounter (Signed)
ok 

## 2020-01-27 NOTE — Telephone Encounter (Signed)
Pt not req Auth#. 

## 2020-01-27 NOTE — Telephone Encounter (Signed)
Scheduled for 11/1 at 1415 with driver.

## 2020-02-03 ENCOUNTER — Ambulatory Visit: Payer: Medicare Other | Admitting: Cardiology

## 2020-02-03 ENCOUNTER — Other Ambulatory Visit: Payer: Self-pay

## 2020-02-03 ENCOUNTER — Encounter: Payer: Self-pay | Admitting: Cardiology

## 2020-02-03 VITALS — BP 143/76 | HR 101 | Resp 16 | Ht 62.0 in | Wt 198.0 lb

## 2020-02-03 DIAGNOSIS — R0609 Other forms of dyspnea: Secondary | ICD-10-CM

## 2020-02-03 DIAGNOSIS — I10 Essential (primary) hypertension: Secondary | ICD-10-CM | POA: Diagnosis not present

## 2020-02-03 DIAGNOSIS — R0600 Dyspnea, unspecified: Secondary | ICD-10-CM

## 2020-02-03 NOTE — Progress Notes (Signed)
Patient referred by Shon Baton, MD for shortness of breath  Subjective:   Tabitha Moore, female    DOB: 18-Dec-1940, 79 y.o.   MRN: [id number]   Chief Complaint  Patient presents with  . Shortness of Breath  . Follow-up    1 year  . Hypertension    HPI  79 y.o. African American female with hypertension, type2 DM, hypothyroidism  Patient was originally seen by me for shortness of breath in 2020. Stress test showed mild breast attenuation artifact in the anteroseptal region without evidence of ischemia.   She has not had any recent chest pain, exertional dyspnea symptoms.    Current Outpatient Medications on File Prior to Visit  Medication Sig Dispense Refill  . acetaminophen (TYLENOL) 325 MG tablet Take 650 mg by mouth every 6 (six) hours as needed for mild pain.    Marland Kitchen acetaminophen (TYLENOL) 500 MG tablet Take 1,000 mg by mouth every 6 (six) hours as needed for mild pain.    Marland Kitchen amLODipine (NORVASC) 5 MG tablet Take 5 mg by mouth daily.     Marland Kitchen azaTHIOprine (IMURAN) 50 MG tablet Take by mouth.    . Bacillus Coagulans-Inulin (PROBIOTIC FORMULA PO) Take 1 tablet by mouth daily.     Marland Kitchen BENICAR 40 MG tablet Take 40 mg by mouth daily.     . Cholecalciferol (VITAMIN D PO) Take 1,000 mg by mouth daily.     . CRESTOR 5 MG tablet Take 5 mg by mouth every morning.     Marland Kitchen doxycycline (VIBRAMYCIN) 100 MG capsule Take 1 capsule (100 mg total) by mouth 2 (two) times daily. 20 capsule 0  . furosemide (LASIX) 20 MG tablet Take 1 tablet (20 mg total) by mouth daily for 5 days. 5 tablet 0  . gabapentin (NEURONTIN) 100 MG capsule Take 100 mg by mouth at bedtime.    . Ginger, Zingiber officinalis, (GINGER EXTRACT PO) Take 1 tablet by mouth daily.     Marland Kitchen glimepiride (AMARYL) 4 MG tablet Take 4 mg by mouth daily.    Marland Kitchen levothyroxine (SYNTHROID, LEVOTHROID) 88 MCG tablet Take 88 mcg by mouth daily.     . metFORMIN (GLUCOPHAGE) 500 MG tablet Take 500 mg by mouth every morning.     . methylPREDNISolone  (MEDROL DOSEPAK) 4 MG TBPK tablet Take as directed on packet instructions 21 tablet 0  . omega-3 acid ethyl esters (LOVAZA) 1 g capsule Take 1 g by mouth daily.    Marland Kitchen omeprazole (PRILOSEC) 40 MG capsule Take 40 mg by mouth daily.     Glory Rosebush ULTRA test strip 2 (two) times daily.    . psyllium (HYDROCIL/METAMUCIL) 95 % PACK Take 1 packet by mouth daily as needed for mild constipation.    . rosuvastatin (CRESTOR) 10 MG tablet Take 10 mg by mouth at bedtime.    . Turmeric 400 MG CAPS Take 1 capsule by mouth daily.      No current facility-administered medications on file prior to visit.    Cardiovascular studies:  EKG 02/03/2020: Sinus rhythm 93 bpm  Normal EKG  Echocardiogram 12/21/2019:  Left ventricle cavity is normal in size. Moderate concentric hypertrophy  of the left ventricle. Normal global wall motion. Normal LV systolic  function with EF 62%. Doppler evidence of grade I (impaired) diastolic  dysfunction, normal LAP.  Left atrial cavity is normal in size. Aneurysmal interatrial septum  without 2D or color Doppler evidence of interatrial shunt.  Trileaflet aortic valve. Moderate (Grade  II) aortic regurgitation.  Mild (Grade I) mitral regurgitation.  Inadequate TR jet to estimate pulmonary artery systolic pressure.  Estimated RA pressure 8 mmHg.  No significant change compared to previous study on 11/07/2018.  [place] Stress Test 12/17/2018: Stress EKG is non-diagnostic, as this is pharmacological stress test. The left ventricular size is markedly reduced at 31 mL.  There is mild breast attenuation artifact in the anteroseptal region without evidence of ischemia.  Stress LV EF is moderately dysfunctional 31%.   Evaluation of LV systolic function Was difficult due to small size of the left ventricle.  Intermediate risk study due to abnormal LVEF. No previous exam available for comparison. Clinical correlation recommended.   EKG 11/04/2018: Sinus rhythm 72  bpm. Normal EKG.  Recent labs: 10/27/2018: Glucose 157. BUN/Cr 10/1.1. eGFR 57. Na/K 138/5.3.  H/H 12/40. MCV 93. Platelets 318.    Review of Systems  Cardiovascular: Positive for dyspnea on exertion. Negative for chest pain, leg swelling, palpitations and syncope.         Vitals:   02/03/20 1029  BP: (!) 143/76  Pulse: (!) 101  Resp: 16  SpO2: 95%    Body mass index is 36.21 kg/m. Filed Weights   02/03/20 1029  Weight: 198 lb (89.8 kg)    Objective:   Physical Exam Vitals and nursing note reviewed.  Constitutional:      General: She is not in acute distress. Neck:     Vascular: No JVD.  Cardiovascular:     Rate and Rhythm: Normal rate and regular rhythm.     Heart sounds: Normal heart sounds. No murmur heard.   Pulmonary:     Effort: Pulmonary effort is normal.     Breath sounds: Normal breath sounds. No wheezing or rales.        Assessment & Recommendations:   79 y.o. African American female with hypertension, type2 DM, hypothyroidism  Hypertension: Mildly elevated today. Has regular f/u w/PCP. No change made today.  Exertional dyspnea Felt to be multifactorial in the past. No ischemia on stress testing. Although stress LVEF was low, echocardiogram shows normal EF with no severe  valvular regurgitation.  Continue f/u w/PCP. I will see her on as needed basis.   Nigel Mormon, MD [facility] Pager: [phone] Office: [phone] If no answer Cell [phone]

## 2020-02-08 ENCOUNTER — Ambulatory Visit (INDEPENDENT_AMBULATORY_CARE_PROVIDER_SITE_OTHER): Payer: Medicare Other | Admitting: Physical Medicine and Rehabilitation

## 2020-02-08 ENCOUNTER — Ambulatory Visit: Payer: Self-pay

## 2020-02-08 ENCOUNTER — Encounter: Payer: Self-pay | Admitting: Physical Medicine and Rehabilitation

## 2020-02-08 ENCOUNTER — Other Ambulatory Visit: Payer: Self-pay

## 2020-02-08 VITALS — BP 151/72 | HR 85

## 2020-02-08 DIAGNOSIS — M5416 Radiculopathy, lumbar region: Secondary | ICD-10-CM

## 2020-02-08 MED ORDER — METHYLPREDNISOLONE ACETATE 80 MG/ML IJ SUSP
80.0000 mg | Freq: Once | INTRAMUSCULAR | Status: AC
  Administered 2020-02-08: 80 mg

## 2020-02-08 NOTE — Progress Notes (Signed)
Pt state lower back pain that travels down her right leg. Pt state walking for long period time and sometime getting out of bed. 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 Pt state she takes pain meds to help ease the pain. Pt state she has to sit with pillow support and level her legs.  Pt has hx of inj on 10/01/19 pt state the pain was still there but the pain got really bad in Aug.   Numeric Pain Rating Scale and Functional Assessment Average Pain 10   In the last MONTH (on 0-10 scale) has pain interfered with the following?  1. General activity like being  able to carry out your everyday physical activities such as walking, climbing stairs, carrying groceries, or moving a chair?  Rating(10)   +Driver, -BT, -Dye Allergies.

## 2020-02-09 NOTE — Progress Notes (Deleted)
Office Visit Note  Patient: Tabitha Moore             Date of Birth: February 16, 1941           MRN: [id number]             PCP: Shon Baton, MD Referring: Shon Baton, MD Visit Date: 02/23/2020 Occupation: @GUAROCC @  Subjective:  No chief complaint on file.   History of Present Illness: Tabitha Moore is a 79 y.o. female ***   Activities of Daily Living:  Patient reports morning stiffness for *** {minute/hour:19697}.   Patient {ACTIONS;DENIES/REPORTS:21021675::"Denies"} nocturnal pain.  Difficulty dressing/grooming: {ACTIONS;DENIES/REPORTS:21021675::"Denies"} Difficulty climbing stairs: {ACTIONS;DENIES/REPORTS:21021675::"Denies"} Difficulty getting out of chair: {ACTIONS;DENIES/REPORTS:21021675::"Denies"} Difficulty using hands for taps, buttons, cutlery, and/or writing: {ACTIONS;DENIES/REPORTS:21021675::"Denies"}  No Rheumatology ROS completed.   PMFS History:  Patient Active Problem List   Diagnosis Date Noted  . Abnormal stress test 01/07/2019  . Nonrheumatic aortic valve insufficiency 01/07/2019  . Exertional dyspnea 11/04/2018  . Dizziness 11/04/2018  . Hallux rigidus, bilateral 05/05/2018  . High risk medication use 11/17/2016  . DDD (degenerative disc disease), lumbar 11/17/2016  . Primary osteoarthritis of both knees 11/13/2016  . Pain in left ankle and joints of left foot 11/13/2016  . Essential hypertension 10/18/2016  . Left ventricular hypertrophy 10/18/2016  . PVC's (premature ventricular contractions) 10/18/2016  . History of diabetes mellitus 10/18/2016  . History of hypothyroidism 10/18/2016  . Dyslipidemia 10/18/2016  . Obesity (BMI 35.0-39.9 without comorbidity) 10/18/2016  . History of GI diverticular bleed 10/18/2016  . Stage 4 chronic kidney disease ([place]) 10/18/2016  . 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 Internal and external prolapsed hemorrhoids, bleeding. 10/05/2011    Past Medical History:  Diagnosis Date  . Arthritis   .  Complication of anesthesia    pt states "hard to wake up"  . Diabetes mellitus   . Hemorrhoids   . Hyperlipidemia   . Hypertension   . Hypothyroidism   . Retinal cyst     Family History  Problem Relation Age of Onset  . Cancer Father        lung  . Cancer Sister        breast   Past Surgical History:  Procedure Laterality Date  . ABDOMINAL HYSTERECTOMY    . BREAST LUMPECTOMY WITH RADIOACTIVE SEED LOCALIZATION Left 08/27/2014   Procedure: BREAST LUMPECTOMY WITH RADIOACTIVE SEED LOCALIZATION;  Surgeon: Autumn Messing III, MD;  Location: [place];  Service: General;  Laterality: Left;  . CATARACT EXTRACTION    . CHOLECYSTECTOMY     Social History   Social History Narrative  . Not on file   Immunization History  Administered Date(s) Administered  . Tdap 03/09/2018     Objective: Vital Signs: There were no vitals taken for this visit.   Physical Exam   Musculoskeletal Exam: ***  CDAI Exam: CDAI Score: -- Patient Global: --; Provider Global: -- Swollen: --; Tender: -- Joint Exam 02/23/2020   No joint exam has been documented for this visit   There is currently no information documented on the homunculus. Go to the Rheumatology activity and complete the homunculus joint exam.  Investigation: No additional findings.  Imaging: Epidural Steroid injection  Result Date: 02/08/2020 Magnus Sinning, MD     02/09/2020  5:36 AM Lumbosacral Transforaminal Epidural Steroid Injection - Sub-Pedicular Approach with Fluoroscopic Guidance Patient: Tabitha Moore     Date of Birth: 08-14-1940 MRN: [id number] PCP: Shon Baton, MD     Visit  Date: 02/08/2020  Universal Protocol:   Date/Time: 02/08/2020 Consent Given By: the patient Position: PRONE Additional Comments: Vital signs were monitored before and after the procedure. Patient was prepped and draped in the usual sterile fashion. The correct patient, procedure, and site was verified. Injection Procedure Details:  Procedure diagnoses: 1. Lumbar radiculopathy   Meds Administered: Meds ordered this encounter Medications . methylPREDNISolone acetate (DEPO-MEDROL) injection 80 mg Laterality: Right Location/Site: L4-L5 Needle size: 22 G Needle type: Spinal Needle Placement: Transforaminal Findings:   -Comments: Excellent flow of contrast along the nerve, nerve root and into the epidural space. Procedure Details: After squaring off the end-plates to get a true AP view, the C-arm was positioned so that an oblique view of the foramen as noted above was visualized. The target area is just inferior to the "nose of the scotty dog" or sub pedicular. The soft tissues overlying this structure were infiltrated with 2-3 ml. of 1% Lidocaine without Epinephrine. The spinal needle was inserted toward the target using a "trajectory" view along the fluoroscope beam.  Under AP and lateral visualization, the needle was advanced so it did not puncture dura and was located close the 6 O'Clock position of the pedical in AP tracterory. Biplanar projections were used to confirm position. Aspiration was confirmed to be negative for CSF and/or blood. A 1-2 ml. volume of Isovue-250 was injected and flow of contrast was noted at each level. Radiographs were obtained for documentation purposes. After attaining the desired flow of contrast documented above, a 0.5 to 1.0 ml test dose of 0.25% Marcaine was injected into each respective transforaminal space.  The patient was observed for 90 seconds post injection.  After no sensory deficits were reported, and normal lower extremity motor function was noted,   the above injectate was administered so that equal amounts of the injectate were placed at each foramen (level) into the transforaminal epidural space. Additional Comments: The patient tolerated the procedure well Dressing: 2 x 2 sterile gauze and Band-Aid  Post-procedure details: Patient was observed during the procedure. Post-procedure instructions were  reviewed. Patient left the clinic in stable condition.   XR C-ARM NO REPORT  Result Date: 02/08/2020 Please see Notes tab for imaging impression.   Recent Labs: Lab Results  Component Value Date   WBC 7.3 11/09/2018   HGB 12.1 11/09/2018   PLT 334 11/09/2018   NA 129 (L) 11/09/2018   K 3.7 11/09/2018   CL 94 (L) 11/09/2018   CO2 20 (L) 11/09/2018   GLUCOSE 96 11/09/2018   BUN 14 11/09/2018   CREATININE 1.07 (H) 11/09/2018   CALCIUM 9.1 11/09/2018   GFRAA 57 (L) 11/09/2018    Speciality Comments: No specialty comments available.  Procedures:  No procedures performed Allergies: Shellfish allergy   Assessment / Plan:     Visit Diagnoses: Chorioretinitis of both eyes  High risk medication use  Primary osteoarthritis of both knees  DDD (degenerative disc disease), lumbar  Piriformis syndrome of right side  Dyslipidemia  History of GI diverticular bleed  Left ventricular hypertrophy  History of diabetes mellitus  History of hypothyroidism  History of hypertension  Orders: No orders of the defined types were placed in this encounter.  No orders of the defined types were placed in this encounter.   Face-to-face time spent with patient was *** minutes. Greater than 50% of time was spent in counseling and coordination of care.  Follow-Up Instructions: No follow-ups on file.   Ofilia Neas, PA-C  Note -  This record has been created using [employer].  Chart creation errors have been sought, but may not always  have been located. Such creation errors do not reflect on  the standard of medical care.

## 2020-02-09 NOTE — Procedures (Signed)
Lumbosacral Transforaminal Epidural Steroid Injection - Sub-Pedicular Approach with Fluoroscopic Guidance  Patient: Tabitha Moore      Date of Birth: 10-22-1940 MRN: [id number] PCP: Shon Baton, MD      Visit Date: 02/08/2020   Universal Protocol:    Date/Time: 02/08/2020  Consent Given By: the patient  Position: PRONE  Additional Comments: Vital signs were monitored before and after the procedure. Patient was prepped and draped in the usual sterile fashion. The correct patient, procedure, and site was verified.   Injection Procedure Details:   Procedure diagnoses:  1. Lumbar radiculopathy      Meds Administered:  Meds ordered this encounter  Medications  . methylPREDNISolone acetate (DEPO-MEDROL) injection 80 mg    Laterality: Right  Location/Site:  L4-L5  Needle size: 22 G  Needle type: Spinal  Needle Placement: Transforaminal  Findings:    -Comments: Excellent flow of contrast along the nerve, nerve root and into the epidural space.  Procedure Details: After squaring off the end-plates to get a true AP view, the C-arm was positioned so that an oblique view of the foramen as noted above was visualized. The target area is just inferior to the "nose of the scotty dog" or sub pedicular. The soft tissues overlying this structure were infiltrated with 2-3 ml. of 1% Lidocaine without Epinephrine.  The spinal needle was inserted toward the target using a "trajectory" view along the fluoroscope beam.  Under AP and lateral visualization, the needle was advanced so it did not puncture dura and was located close the 6 O'Clock position of the pedical in AP tracterory. Biplanar projections were used to confirm position. Aspiration was confirmed to be negative for CSF and/or blood. A 1-2 ml. volume of Isovue-250 was injected and flow of contrast was noted at each level. Radiographs were obtained for documentation purposes.   After attaining the desired flow of contrast  documented above, a 0.5 to 1.0 ml test dose of 0.25% Marcaine was injected into each respective transforaminal space.  The patient was observed for 90 seconds post injection.  After no sensory deficits were reported, and normal lower extremity motor function was noted,   the above injectate was administered so that equal amounts of the injectate were placed at each foramen (level) into the transforaminal epidural space.   Additional Comments:  The patient tolerated the procedure well Dressing: 2 x 2 sterile gauze and Band-Aid    Post-procedure details: Patient was observed during the procedure. Post-procedure instructions were reviewed.  Patient left the clinic in stable condition.

## 2020-02-09 NOTE — Progress Notes (Signed)
Tabitha Moore - 79 y.o. female MRN [id number]  Date of birth: December 21, 1940  Office Visit Note: Visit Date: 02/08/2020 PCP: Shon Baton, MD Referred by: Shon Baton, MD  Subjective: Chief Complaint  Patient presents with  . Lower Back - Pain  . Right Leg - Pain   HPI:  Tabitha Moore is a 79 y.o. female who comes in today for planned repeat Right L4-L5 Lumbar epidural steroid injection with fluoroscopic guidance.  The patient has failed conservative care including home exercise, medications, time and activity modification.  This injection will be diagnostic and hopefully therapeutic.  Please see requesting physician notes for further details and justification. Patient received more than 50% pain relief from prior injection.   Referring: Dr. Eunice Blase prior to that was followed by Dr. Eduard Roux  MRI 2018 shows multilevel facet arthropathy with moderate severe stenosis at L4-5.  Interlaminar injection at that time was beneficial.  Last injection was a transforaminal approach that did help but lasted a couple of months.  We can repeat the transforaminal approach today.  Would consider interlaminar approach at L5-S1.  Given the timing however probably would not try interlaminar approach at L4-5 without updated MRI which is an issue.   ROS Otherwise per HPI.  Assessment & Plan: Visit Diagnoses:  1. Lumbar radiculopathy     Plan: No additional findings.   Meds & Orders:  Meds ordered this encounter  Medications  . methylPREDNISolone acetate (DEPO-MEDROL) injection 80 mg    Orders Placed This Encounter  Procedures  . XR C-ARM NO REPORT  . Epidural Steroid injection    Follow-up: Return if symptoms worsen or fail to improve.   Procedures: No procedures performed  Lumbosacral Transforaminal Epidural Steroid Injection - Sub-Pedicular Approach with Fluoroscopic Guidance  Patient: Tabitha Moore      Date of Birth: [birth year] MRN: [id number] PCP: Shon Baton, MD      Visit  Date: 02/08/2020   Universal Protocol:    Date/Time: 02/08/2020  Consent Given By: the patient  Position: PRONE  Additional Comments: Vital signs were monitored before and after the procedure. Patient was prepped and draped in the usual sterile fashion. The correct patient, procedure, and site was verified.   Injection Procedure Details:   Procedure diagnoses:  1. Lumbar radiculopathy      Meds Administered:  Meds ordered this encounter  Medications  . methylPREDNISolone acetate (DEPO-MEDROL) injection 80 mg    Laterality: Right  Location/Site:  L4-L5  Needle size: 22 G  Needle type: Spinal  Needle Placement: Transforaminal  Findings:    -Comments: Excellent flow of contrast along the nerve, nerve root and into the epidural space.  Procedure Details: After squaring off the end-plates to get a true AP view, the C-arm was positioned so that an oblique view of the foramen as noted above was visualized. The target area is just inferior to the "nose of the scotty dog" or sub pedicular. The soft tissues overlying this structure were infiltrated with 2-3 ml. of 1% Lidocaine without Epinephrine.  The spinal needle was inserted toward the target using a "trajectory" view along the fluoroscope beam.  Under AP and lateral visualization, the needle was advanced so it did not puncture dura and was located close the 6 O'Clock position of the pedical in AP tracterory. Biplanar projections were used to confirm position. Aspiration was confirmed to be negative for CSF and/or blood. A 1-2 ml. volume of Isovue-250 was injected and flow of contrast was noted  at each level. Radiographs were obtained for documentation purposes.   After attaining the desired flow of contrast documented above, a 0.5 to 1.0 ml test dose of 0.25% Marcaine was injected into each respective transforaminal space.  The patient was observed for 90 seconds post injection.  After no sensory deficits were reported, and  normal lower extremity motor function was noted,   the above injectate was administered so that equal amounts of the injectate were placed at each foramen (level) into the transforaminal epidural space.   Additional Comments:  The patient tolerated the procedure well Dressing: 2 x 2 sterile gauze and Band-Aid    Post-procedure details: Patient was observed during the procedure. Post-procedure instructions were reviewed.  Patient left the clinic in stable condition.      Clinical History: Narrative & Impression CLINICAL DATA:  Low back pain for 4 5 months. Bilateral leg pain, left greater than right.  EXAM: MRI LUMBAR SPINE WITHOUT CONTRAST  TECHNIQUE: Multiplanar, multisequence MR imaging of the lumbar spine was performed. No intravenous contrast was administered.  COMPARISON:  None.  FINDINGS: Segmentation: 5 lumbar type vertebral bodies. The last full intervertebral disc space is labeled L5-S1.  Alignment: Normal overall alignment. There is mild multilevel degenerative spondylolisthesis.  Vertebrae: Normal marrow signal. No bone lesions or fracture. Mild endplate reactive changes at L5-S1.  Conus medullaris: Extends to the bottom of L1 level and appears normal.  Paraspinal and other soft tissues: No significant findings.  Disc levels:  T12-L1: Moderate disc disease and mild facet disease. There is mild mass effect on the left side of the thecal sac with left lateral recess encroachment but no significant spinal stenosis. Mild bilateral foraminal encroachment.  L1-2: Mild diffuse annular bulge and osteophytic ridging with mild bilateral lateral recess encroachment and mild bilateral foraminal encroachment. No significant spinal stenosis.  L2-3: Moderate disc disease and facet disease with a bulging degenerated annulus and osteophytic ridging. There is mild bilateral lateral recess stenosis and mild bilateral foraminal encroachment. Early spinal  stenosis.  L3-4: Degenerated bulging uncovered disc, osteophytic ridging, short pedicles and facet disease contributing to mild spinal stenosis and moderate right greater than left lateral recess stenosis. Mild foraminal stenosis bilaterally, left greater than right due to a shallow broad-based disc protrusion.  L4-5 bulging uncovered disc, osteophytic ridging, short pedicles and advanced facet disease contributing to moderate to moderately severe spinal and bilateral lateral recess stenosis. There is also mild right foraminal stenosis due to a shallow foraminal/extraforaminal disc protrusion.  L5-S1: Severe facet disease and moderate to advanced disc disease. No significant spinal or lateral recess stenosis. Mild bilateral foraminal stenosis, right greater than left.  IMPRESSION: 1. 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 2. Multilevel multifactorial spinal, lateral recess and foraminal stenosis as discussed above at the individual levels. These findings are most significant at L4-5.   Electronically Signed   By: Marijo Sanes M.D.   On: 08/24/2016 14:51     Objective:  VS:  HT:    WT:   BMI:     BP:(!) 151/72  HR:85bpm  TEMP: ( )  RESP:  Physical Exam Constitutional:      General: She is not in acute distress.    Appearance: Normal appearance. She is obese. She is not ill-appearing.  HENT:     Head: Normocephalic and atraumatic.     Right Ear: External ear normal.     Left Ear: External ear normal.  Eyes:     Extraocular Movements:  Extraocular movements intact.  Cardiovascular:     Rate and Rhythm: Normal rate.     Pulses: Normal pulses.  Musculoskeletal:     Right lower leg: No edema.     Left lower leg: No edema.     Comments: Patient has good distal strength with no pain over the greater trochanters.  No clonus or focal weakness.  Skin:    Findings: No erythema, lesion or rash.  Neurological:     General:  No focal deficit present.     Mental Status: She is alert and oriented to person, place, and time.     Sensory: No sensory deficit.     Motor: No weakness or abnormal muscle tone.     Coordination: Coordination normal.  Psychiatric:        Mood and Affect: Mood normal.        Behavior: Behavior normal.      Imaging: XR C-ARM NO REPORT  Result Date: 02/08/2020 Please see Notes tab for imaging impression.

## 2020-02-23 ENCOUNTER — Ambulatory Visit: Payer: Medicare Other | Admitting: Physician Assistant

## 2020-02-23 DIAGNOSIS — Z8719 Personal history of other diseases of the digestive system: Secondary | ICD-10-CM

## 2020-02-23 DIAGNOSIS — I517 Cardiomegaly: Secondary | ICD-10-CM

## 2020-02-23 DIAGNOSIS — M170 Bilateral primary osteoarthritis of knee: Secondary | ICD-10-CM

## 2020-02-23 DIAGNOSIS — G5701 Lesion of sciatic nerve, right lower limb: Secondary | ICD-10-CM

## 2020-02-23 DIAGNOSIS — M5136 Other intervertebral disc degeneration, lumbar region: Secondary | ICD-10-CM

## 2020-02-23 DIAGNOSIS — H3093 Unspecified chorioretinal inflammation, bilateral: Secondary | ICD-10-CM

## 2020-02-23 DIAGNOSIS — Z8679 Personal history of other diseases of the circulatory system: Secondary | ICD-10-CM

## 2020-02-23 DIAGNOSIS — E785 Hyperlipidemia, unspecified: Secondary | ICD-10-CM

## 2020-02-23 DIAGNOSIS — Z79899 Other long term (current) drug therapy: Secondary | ICD-10-CM

## 2020-02-23 DIAGNOSIS — Z8639 Personal history of other endocrine, nutritional and metabolic disease: Secondary | ICD-10-CM

## 2020-03-15 DIAGNOSIS — Z23 Encounter for immunization: Secondary | ICD-10-CM | POA: Diagnosis not present

## 2020-03-23 ENCOUNTER — Telehealth: Payer: Self-pay | Admitting: Physical Medicine and Rehabilitation

## 2020-03-23 NOTE — Telephone Encounter (Signed)
Pt called stating she received an injection and it minimized the pain for a little while but now  her pain is coming back and she would like a CB to discuss some options she may have.   [phone]

## 2020-03-23 NOTE — Telephone Encounter (Signed)
Pt called to discuss some options, pt was lasted seen on 02/08/20 for right L4-L5 TF.

## 2020-03-23 NOTE — Telephone Encounter (Signed)
Called pt and sch 04/21/20

## 2020-03-23 NOTE — Telephone Encounter (Signed)
If helped short term then would do L5-S1 interlam, if on anticoags then would need to get approval, if no help at all can OV with [place] or [place]

## 2020-04-21 ENCOUNTER — Encounter: Payer: Self-pay | Admitting: Physical Medicine and Rehabilitation

## 2020-04-21 ENCOUNTER — Ambulatory Visit: Payer: Self-pay

## 2020-04-21 ENCOUNTER — Ambulatory Visit (INDEPENDENT_AMBULATORY_CARE_PROVIDER_SITE_OTHER): Payer: Medicare Other | Admitting: Physical Medicine and Rehabilitation

## 2020-04-21 ENCOUNTER — Other Ambulatory Visit: Payer: Self-pay

## 2020-04-21 VITALS — BP 147/84 | HR 87

## 2020-04-21 DIAGNOSIS — M5416 Radiculopathy, lumbar region: Secondary | ICD-10-CM | POA: Diagnosis not present

## 2020-04-21 DIAGNOSIS — M48062 Spinal stenosis, lumbar region with neurogenic claudication: Secondary | ICD-10-CM | POA: Diagnosis not present

## 2020-04-21 MED ORDER — BETAMETHASONE SOD PHOS & ACET 6 (3-3) MG/ML IJ SUSP
12.0000 mg | Freq: Once | INTRAMUSCULAR | Status: AC
  Administered 2020-04-21: 12 mg

## 2020-04-21 MED ORDER — BACLOFEN 10 MG PO TABS: ORAL_TABLET | ORAL | 0 refills | Status: AC

## 2020-04-21 NOTE — Patient Instructions (Signed)

## 2020-04-21 NOTE — Progress Notes (Signed)
Pt state lower back pain that travels to her right hip. Pt state when she bend over the pain hurts. Pt state she takes over the counter meds to help ease the pain. Pt has hx of inj on 02/08/20 pt state it helped a little but it just ease the pain  Numeric Pain Rating Scale and Functional Assessment Average Pain 6   In the last MONTH (on 0-10 scale) has pain interfered with the following?  1. General activity like being  able to carry out your everyday physical activities such as walking, climbing stairs, carrying groceries, or moving a chair?  Rating(10)   +Driver, -BT, -Dye Allergies.

## 2020-05-03 DIAGNOSIS — Z961 Presence of intraocular lens: Secondary | ICD-10-CM | POA: Diagnosis not present

## 2020-05-03 DIAGNOSIS — Z794 Long term (current) use of insulin: Secondary | ICD-10-CM | POA: Diagnosis not present

## 2020-05-03 DIAGNOSIS — H30033 Focal chorioretinal inflammation, peripheral, bilateral: Secondary | ICD-10-CM | POA: Diagnosis not present

## 2020-05-03 DIAGNOSIS — H26492 Other secondary cataract, left eye: Secondary | ICD-10-CM | POA: Diagnosis not present

## 2020-05-03 DIAGNOSIS — E113313 Type 2 diabetes mellitus with moderate nonproliferative diabetic retinopathy with macular edema, bilateral: Secondary | ICD-10-CM | POA: Diagnosis not present

## 2020-05-03 DIAGNOSIS — Z7984 Long term (current) use of oral hypoglycemic drugs: Secondary | ICD-10-CM | POA: Diagnosis not present

## 2020-05-03 DIAGNOSIS — H3581 Retinal edema: Secondary | ICD-10-CM | POA: Diagnosis not present

## 2020-05-03 DIAGNOSIS — Z79899 Other long term (current) drug therapy: Secondary | ICD-10-CM | POA: Diagnosis not present

## 2020-05-31 NOTE — Progress Notes (Signed)
Tabitha Moore - 80 y.o. female MRN [id number]  Date of birth: 10-07-1940  Office Visit Note: Visit Date: 04/21/2020 PCP: Shon Baton, MD Referred by: Shon Baton, MD  Subjective: Chief Complaint  Patient presents with  . Lower Back - Pain  . Right Hip - Pain   HPI:  Tabitha Moore is a 80 y.o. female who comes in today at the request of Dr. Eduard Roux for planned Right L5-S1 Lumbar epidural steroid injection with fluoroscopic guidance.  The patient has failed conservative care including home exercise, medications, time and activity modification.  This injection will be diagnostic and hopefully therapeutic.  Please see requesting physician notes for further details and justification.  Prior right L4 transforaminal injection diagnostically did give her some relief of her hip pain but did not last very long.  We will try an interlaminar approach.  MRI findings significant for somewhat lumbarization of the S1 segment.  Quite a bit of foraminal stenosis at L4-5.   ROS Otherwise per HPI.  Assessment & Plan: Visit Diagnoses:    ICD-10-CM   1. Lumbar radiculopathy  M54.16 XR C-ARM NO REPORT    Epidural Steroid injection    betamethasone acetate-betamethasone sodium phosphate (CELESTONE) injection 12 mg  2. Spinal stenosis of lumbar region with neurogenic claudication  M48.062 XR C-ARM NO REPORT    Epidural Steroid injection    betamethasone acetate-betamethasone sodium phosphate (CELESTONE) injection 12 mg    Plan: No additional findings.   Meds & Orders:  Meds ordered this encounter  Medications  . betamethasone acetate-betamethasone sodium phosphate (CELESTONE) injection 12 mg  . baclofen (LIORESAL) 10 MG tablet    Sig: Take 1/2 to 1 by mouth every 8hrs as needed for spasm    Dispense:  60 tablet    Refill:  0    Orders Placed This Encounter  Procedures  . XR C-ARM NO REPORT  . Epidural Steroid injection    Follow-up: Return if symptoms worsen or fail to improve.    Procedures: No procedures performed  Lumbar Epidural Steroid Injection - Interlaminar Approach with Fluoroscopic Guidance  Patient: Tabitha Moore      Date of Birth: [birth year] MRN: [id number] PCP: Shon Baton, MD      Visit Date: 04/21/2020   Universal Protocol:     Consent Given By: the patient  Position: PRONE  Additional Comments: Vital signs were monitored before and after the procedure. Patient was prepped and draped in the usual sterile fashion. The correct patient, procedure, and site was verified.   Injection Procedure Details:   Procedure diagnoses: Lumbar radiculopathy [M54.16]   Meds Administered:  Meds ordered this encounter  Medications  . betamethasone acetate-betamethasone sodium phosphate (CELESTONE) injection 12 mg  . baclofen (LIORESAL) 10 MG tablet    Sig: Take 1/2 to 1 by mouth every 8hrs as needed for spasm    Dispense:  60 tablet    Refill:  0     Laterality: Right  Location/Site:  L5-S1  Needle: 3.5 in., 20 ga. Tuohy  Needle Placement: Paramedian epidural  Findings:   -Comments: Excellent flow of contrast into the epidural space.  Procedure Details: Using a paramedian approach from the side mentioned above, the region overlying the inferior lamina was localized under fluoroscopic visualization and the soft tissues overlying this structure were infiltrated with 4 ml. of 1% Lidocaine without Epinephrine. The Tuohy needle was inserted into the epidural space using a paramedian approach.   The epidural space was localized using  loss of resistance along with counter oblique bi-planar fluoroscopic views.  After negative aspirate for air, blood, and CSF, a 2 ml. volume of Isovue-250 was injected into the epidural space and the flow of contrast was observed. Radiographs were obtained for documentation purposes.    The injectate was administered into the level noted above.   Additional Comments:  The patient tolerated the procedure  well Dressing: 2 x 2 sterile gauze and Band-Aid    Post-procedure details: Patient was observed during the procedure. Post-procedure instructions were reviewed.  Patient left the clinic in stable condition.     Clinical History: Narrative & Impression CLINICAL DATA:  Low back pain for 4 5 months. Bilateral leg pain, left greater than right.  EXAM: MRI LUMBAR SPINE WITHOUT CONTRAST  TECHNIQUE: Multiplanar, multisequence MR imaging of the lumbar spine was performed. No intravenous contrast was administered.  COMPARISON:  None.  FINDINGS: Segmentation: 5 lumbar type vertebral bodies. The last full intervertebral disc space is labeled L5-S1.  Alignment: Normal overall alignment. There is mild multilevel degenerative spondylolisthesis.  Vertebrae: Normal marrow signal. No bone lesions or fracture. Mild endplate reactive changes at L5-S1.  Conus medullaris: Extends to the bottom of L1 level and appears normal.  Paraspinal and other soft tissues: No significant findings.  Disc levels:  T12-L1: Moderate disc disease and mild facet disease. There is mild mass effect on the left side of the thecal sac with left lateral recess encroachment but no significant spinal stenosis. Mild bilateral foraminal encroachment.  L1-2: Mild diffuse annular bulge and osteophytic ridging with mild bilateral lateral recess encroachment and mild bilateral foraminal encroachment. No significant spinal stenosis.  L2-3: Moderate disc disease and facet disease with a bulging degenerated annulus and osteophytic ridging. There is mild bilateral lateral recess stenosis and mild bilateral foraminal encroachment. Early spinal stenosis.  L3-4: Degenerated bulging uncovered disc, osteophytic ridging, short pedicles and facet disease contributing to mild spinal stenosis and moderate right greater than left lateral recess stenosis. Mild foraminal stenosis bilaterally, left greater than  right due to a shallow broad-based disc protrusion.  L4-5 bulging uncovered disc, osteophytic ridging, short pedicles and advanced facet disease contributing to moderate to moderately severe spinal and bilateral lateral recess stenosis. There is also mild right foraminal stenosis due to a shallow foraminal/extraforaminal disc protrusion.  L5-S1: Severe facet disease and moderate to advanced disc disease. No significant spinal or lateral recess stenosis. Mild bilateral foraminal stenosis, right greater than left.  IMPRESSION: 1. 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 2. Multilevel multifactorial spinal, lateral recess and foraminal stenosis as discussed above at the individual levels. These findings are most significant at L4-5.   Electronically Signed   By: Marijo Sanes M.D.   On: 08/24/2016 14:51     Objective:  VS:  HT:    WT:   BMI:     BP:(!) 147/84  HR:87bpm  TEMP: ( )  RESP:  Physical Exam Vitals and nursing note reviewed.  Constitutional:      General: She is not in acute distress.    Appearance: Normal appearance. She is not ill-appearing.  HENT:     Head: Normocephalic and atraumatic.     Right Ear: External ear normal.     Left Ear: External ear normal.  Eyes:     Extraocular Movements: Extraocular movements intact.  Cardiovascular:     Rate and Rhythm: Normal rate.     Pulses: Normal pulses.  Pulmonary:     Effort: Pulmonary  effort is normal. No respiratory distress.  Abdominal:     General: There is no distension.     Palpations: Abdomen is soft.  Musculoskeletal:        General: Tenderness present.     Cervical back: Neck supple.     Right lower leg: No edema.     Left lower leg: No edema.     Comments: Patient has good distal strength with no pain over the greater trochanters.  No clonus or focal weakness.  Skin:    Findings: No erythema, lesion or rash.  Neurological:     General: No focal  deficit present.     Mental Status: She is alert and oriented to person, place, and time.     Sensory: No sensory deficit.     Motor: No weakness or abnormal muscle tone.     Coordination: Coordination normal.  Psychiatric:        Mood and Affect: Mood normal.        Behavior: Behavior normal.      Imaging: No results found.

## 2020-05-31 NOTE — Procedures (Signed)
Lumbar Epidural Steroid Injection - Interlaminar Approach with Fluoroscopic Guidance  Patient: Tabitha Moore      Date of Birth: 03-14-1941 MRN: [id number] PCP: Shon Baton, MD      Visit Date: 04/21/2020   Universal Protocol:     Consent Given By: the patient  Position: PRONE  Additional Comments: Vital signs were monitored before and after the procedure. Patient was prepped and draped in the usual sterile fashion. The correct patient, procedure, and site was verified.   Injection Procedure Details:   Procedure diagnoses: Lumbar radiculopathy [M54.16]   Meds Administered:  Meds ordered this encounter  Medications  . betamethasone acetate-betamethasone sodium phosphate (CELESTONE) injection 12 mg  . baclofen (LIORESAL) 10 MG tablet    Sig: Take 1/2 to 1 by mouth every 8hrs as needed for spasm    Dispense:  60 tablet    Refill:  0     Laterality: Right  Location/Site:  L5-S1  Needle: 3.5 in., 20 ga. Tuohy  Needle Placement: Paramedian epidural  Findings:   -Comments: Excellent flow of contrast into the epidural space.  Procedure Details: Using a paramedian approach from the side mentioned above, the region overlying the inferior lamina was localized under fluoroscopic visualization and the soft tissues overlying this structure were infiltrated with 4 ml. of 1% Lidocaine without Epinephrine. The Tuohy needle was inserted into the epidural space using a paramedian approach.   The epidural space was localized using loss of resistance along with counter oblique bi-planar fluoroscopic views.  After negative aspirate for air, blood, and CSF, a 2 ml. volume of Isovue-250 was injected into the epidural space and the flow of contrast was observed. Radiographs were obtained for documentation purposes.    The injectate was administered into the level noted above.   Additional Comments:  The patient tolerated the procedure well Dressing: 2 x 2 sterile gauze and Band-Aid     Post-procedure details: Patient was observed during the procedure. Post-procedure instructions were reviewed.  Patient left the clinic in stable condition.

## 2020-06-20 DIAGNOSIS — N1831 Chronic kidney disease, stage 3a: Secondary | ICD-10-CM | POA: Diagnosis not present

## 2020-06-20 DIAGNOSIS — E1122 Type 2 diabetes mellitus with diabetic chronic kidney disease: Secondary | ICD-10-CM | POA: Diagnosis not present

## 2020-06-20 DIAGNOSIS — E785 Hyperlipidemia, unspecified: Secondary | ICD-10-CM | POA: Diagnosis not present

## 2020-06-20 DIAGNOSIS — E039 Hypothyroidism, unspecified: Secondary | ICD-10-CM | POA: Diagnosis not present

## 2020-06-27 DIAGNOSIS — Z1389 Encounter for screening for other disorder: Secondary | ICD-10-CM | POA: Diagnosis not present

## 2020-06-27 DIAGNOSIS — I517 Cardiomegaly: Secondary | ICD-10-CM | POA: Diagnosis not present

## 2020-06-27 DIAGNOSIS — H209 Unspecified iridocyclitis: Secondary | ICD-10-CM | POA: Diagnosis not present

## 2020-06-27 DIAGNOSIS — E039 Hypothyroidism, unspecified: Secondary | ICD-10-CM | POA: Diagnosis not present

## 2020-06-27 DIAGNOSIS — E785 Hyperlipidemia, unspecified: Secondary | ICD-10-CM | POA: Diagnosis not present

## 2020-06-27 DIAGNOSIS — E1140 Type 2 diabetes mellitus with diabetic neuropathy, unspecified: Secondary | ICD-10-CM | POA: Diagnosis not present

## 2020-06-27 DIAGNOSIS — E1122 Type 2 diabetes mellitus with diabetic chronic kidney disease: Secondary | ICD-10-CM | POA: Diagnosis not present

## 2020-06-27 DIAGNOSIS — Z0000 Encounter for general adult medical examination without abnormal findings: Secondary | ICD-10-CM | POA: Diagnosis not present

## 2020-06-27 DIAGNOSIS — R82998 Other abnormal findings in urine: Secondary | ICD-10-CM | POA: Diagnosis not present

## 2020-06-27 DIAGNOSIS — E871 Hypo-osmolality and hyponatremia: Secondary | ICD-10-CM | POA: Diagnosis not present

## 2020-06-27 DIAGNOSIS — I129 Hypertensive chronic kidney disease with stage 1 through stage 4 chronic kidney disease, or unspecified chronic kidney disease: Secondary | ICD-10-CM | POA: Diagnosis not present

## 2020-06-27 DIAGNOSIS — I351 Nonrheumatic aortic (valve) insufficiency: Secondary | ICD-10-CM | POA: Diagnosis not present

## 2020-06-27 DIAGNOSIS — Z1331 Encounter for screening for depression: Secondary | ICD-10-CM | POA: Diagnosis not present

## 2020-06-27 DIAGNOSIS — N1831 Chronic kidney disease, stage 3a: Secondary | ICD-10-CM | POA: Diagnosis not present

## 2020-08-04 DIAGNOSIS — Z23 Encounter for immunization: Secondary | ICD-10-CM | POA: Diagnosis not present

## 2020-08-09 DIAGNOSIS — H3581 Retinal edema: Secondary | ICD-10-CM | POA: Diagnosis not present

## 2020-08-09 DIAGNOSIS — H6123 Impacted cerumen, bilateral: Secondary | ICD-10-CM | POA: Diagnosis not present

## 2020-08-09 DIAGNOSIS — E113313 Type 2 diabetes mellitus with moderate nonproliferative diabetic retinopathy with macular edema, bilateral: Secondary | ICD-10-CM | POA: Diagnosis not present

## 2020-08-09 DIAGNOSIS — Z794 Long term (current) use of insulin: Secondary | ICD-10-CM | POA: Diagnosis not present

## 2020-08-09 DIAGNOSIS — Z79899 Other long term (current) drug therapy: Secondary | ICD-10-CM | POA: Diagnosis not present

## 2020-08-09 DIAGNOSIS — Z7984 Long term (current) use of oral hypoglycemic drugs: Secondary | ICD-10-CM | POA: Diagnosis not present

## 2020-08-09 DIAGNOSIS — Z961 Presence of intraocular lens: Secondary | ICD-10-CM | POA: Diagnosis not present

## 2020-08-09 DIAGNOSIS — H30033 Focal chorioretinal inflammation, peripheral, bilateral: Secondary | ICD-10-CM | POA: Diagnosis not present

## 2020-08-09 DIAGNOSIS — H26492 Other secondary cataract, left eye: Secondary | ICD-10-CM | POA: Diagnosis not present

## 2020-09-09 DIAGNOSIS — R600 Localized edema: Secondary | ICD-10-CM | POA: Diagnosis not present

## 2020-09-09 DIAGNOSIS — N1831 Chronic kidney disease, stage 3a: Secondary | ICD-10-CM | POA: Diagnosis not present

## 2020-09-09 DIAGNOSIS — E1122 Type 2 diabetes mellitus with diabetic chronic kidney disease: Secondary | ICD-10-CM | POA: Diagnosis not present

## 2020-09-09 DIAGNOSIS — H209 Unspecified iridocyclitis: Secondary | ICD-10-CM | POA: Diagnosis not present

## 2020-09-09 DIAGNOSIS — D899 Disorder involving the immune mechanism, unspecified: Secondary | ICD-10-CM | POA: Diagnosis not present

## 2020-09-09 DIAGNOSIS — I129 Hypertensive chronic kidney disease with stage 1 through stage 4 chronic kidney disease, or unspecified chronic kidney disease: Secondary | ICD-10-CM | POA: Diagnosis not present

## 2020-09-09 DIAGNOSIS — E1140 Type 2 diabetes mellitus with diabetic neuropathy, unspecified: Secondary | ICD-10-CM | POA: Diagnosis not present

## 2020-09-09 DIAGNOSIS — G629 Polyneuropathy, unspecified: Secondary | ICD-10-CM | POA: Diagnosis not present

## 2020-09-09 DIAGNOSIS — I351 Nonrheumatic aortic (valve) insufficiency: Secondary | ICD-10-CM | POA: Diagnosis not present

## 2020-09-09 DIAGNOSIS — I429 Cardiomyopathy, unspecified: Secondary | ICD-10-CM | POA: Diagnosis not present

## 2020-09-09 DIAGNOSIS — E11319 Type 2 diabetes mellitus with unspecified diabetic retinopathy without macular edema: Secondary | ICD-10-CM | POA: Diagnosis not present

## 2020-09-26 ENCOUNTER — Telehealth: Payer: Self-pay

## 2020-09-26 NOTE — Telephone Encounter (Signed)
Spoke with patient and advised we would mail a copy of natural anti-inflammatories to her. Placed in the mail today.

## 2020-09-26 NOTE — Telephone Encounter (Signed)
Patient called stating she had an appointment last year 08/25/19 and Dr. Estanislado Pandy recommended Omega 3, Turmeric, and Ginger.  Patient requested a return call with the dosage information that she recommends.

## 2020-11-10 DIAGNOSIS — K5900 Constipation, unspecified: Secondary | ICD-10-CM | POA: Diagnosis not present

## 2020-11-10 DIAGNOSIS — K219 Gastro-esophageal reflux disease without esophagitis: Secondary | ICD-10-CM | POA: Diagnosis not present

## 2020-11-10 DIAGNOSIS — E669 Obesity, unspecified: Secondary | ICD-10-CM | POA: Diagnosis not present

## 2020-11-11 DIAGNOSIS — I129 Hypertensive chronic kidney disease with stage 1 through stage 4 chronic kidney disease, or unspecified chronic kidney disease: Secondary | ICD-10-CM | POA: Diagnosis not present

## 2020-11-11 DIAGNOSIS — I429 Cardiomyopathy, unspecified: Secondary | ICD-10-CM | POA: Diagnosis not present

## 2020-11-11 DIAGNOSIS — I517 Cardiomegaly: Secondary | ICD-10-CM | POA: Diagnosis not present

## 2020-11-11 DIAGNOSIS — I351 Nonrheumatic aortic (valve) insufficiency: Secondary | ICD-10-CM | POA: Diagnosis not present

## 2020-11-11 DIAGNOSIS — E1122 Type 2 diabetes mellitus with diabetic chronic kidney disease: Secondary | ICD-10-CM | POA: Diagnosis not present

## 2020-11-11 DIAGNOSIS — I77819 Aortic ectasia, unspecified site: Secondary | ICD-10-CM | POA: Diagnosis not present

## 2020-11-11 DIAGNOSIS — E871 Hypo-osmolality and hyponatremia: Secondary | ICD-10-CM | POA: Diagnosis not present

## 2020-11-11 DIAGNOSIS — E11319 Type 2 diabetes mellitus with unspecified diabetic retinopathy without macular edema: Secondary | ICD-10-CM | POA: Diagnosis not present

## 2020-11-11 DIAGNOSIS — E1140 Type 2 diabetes mellitus with diabetic neuropathy, unspecified: Secondary | ICD-10-CM | POA: Diagnosis not present

## 2020-11-11 DIAGNOSIS — N1831 Chronic kidney disease, stage 3a: Secondary | ICD-10-CM | POA: Diagnosis not present

## 2020-11-11 DIAGNOSIS — E039 Hypothyroidism, unspecified: Secondary | ICD-10-CM | POA: Diagnosis not present

## 2020-11-14 ENCOUNTER — Telehealth: Payer: Self-pay

## 2020-11-14 NOTE — Telephone Encounter (Signed)
Pt call and would like to set up an appt with Dr. Ernestina Patches

## 2020-11-18 ENCOUNTER — Ambulatory Visit (INDEPENDENT_AMBULATORY_CARE_PROVIDER_SITE_OTHER): Payer: Medicare Other | Admitting: Orthopaedic Surgery

## 2020-11-18 ENCOUNTER — Ambulatory Visit (INDEPENDENT_AMBULATORY_CARE_PROVIDER_SITE_OTHER): Payer: Medicare Other

## 2020-11-18 ENCOUNTER — Other Ambulatory Visit: Payer: Self-pay

## 2020-11-18 ENCOUNTER — Ambulatory Visit: Payer: Self-pay

## 2020-11-18 ENCOUNTER — Encounter: Payer: Self-pay | Admitting: Orthopaedic Surgery

## 2020-11-18 DIAGNOSIS — M25562 Pain in left knee: Secondary | ICD-10-CM | POA: Diagnosis not present

## 2020-11-18 DIAGNOSIS — G8929 Other chronic pain: Secondary | ICD-10-CM | POA: Diagnosis not present

## 2020-11-18 DIAGNOSIS — M25561 Pain in right knee: Secondary | ICD-10-CM | POA: Diagnosis not present

## 2020-11-19 DIAGNOSIS — G8929 Other chronic pain: Secondary | ICD-10-CM

## 2020-11-19 DIAGNOSIS — M25562 Pain in left knee: Secondary | ICD-10-CM | POA: Diagnosis not present

## 2020-11-19 DIAGNOSIS — M25561 Pain in right knee: Secondary | ICD-10-CM | POA: Diagnosis not present

## 2020-11-19 MED ORDER — LIDOCAINE HCL 1 % IJ SOLN
2.0000 mL | INTRAMUSCULAR | Status: AC | PRN
  Administered 2020-11-19: 2 mL

## 2020-11-19 MED ORDER — BUPIVACAINE HCL 0.5 % IJ SOLN
2.0000 mL | INTRAMUSCULAR | Status: AC | PRN
Start: 2020-11-19 — End: 2020-11-19
  Administered 2020-11-19: 2 mL via INTRA_ARTICULAR

## 2020-11-19 MED ORDER — METHYLPREDNISOLONE ACETATE 40 MG/ML IJ SUSP
40.0000 mg | INTRAMUSCULAR | Status: AC | PRN
  Administered 2020-11-19: 40 mg via INTRA_ARTICULAR

## 2020-11-19 MED ORDER — BUPIVACAINE HCL 0.5 % IJ SOLN
2.0000 mL | INTRAMUSCULAR | Status: AC | PRN
  Administered 2020-11-19: 2 mL via INTRA_ARTICULAR

## 2020-11-19 NOTE — Progress Notes (Signed)
Office Visit Note   Patient: Tabitha Moore           Date of Birth: 26-Apr-1940           MRN: [id number] Visit Date: 11/18/2020              Requested by: Tabitha Moore, Tabitha Moore,  Tabitha Moore [id number] PCP: Tabitha Baton, MD   Assessment & Plan: Visit Diagnoses:  1. Chronic pain of both knees     Plan: Impression is ongoing bilateral knee osteoarthritis that is fairly severe.  She would like to receive cortisone injections again as the previous ones did not provide significant relief.  She tolerated these injections well today.  We will see her back as needed.  Follow-Up Instructions: No follow-ups on file.   Orders:  Orders Placed This Encounter  Procedures   XR KNEE 3 VIEW LEFT   XR KNEE 3 VIEW RIGHT   No orders of the defined types were placed in this encounter.     Procedures: Large Joint Inj: bilateral knee on 11/19/2020 8:25 PM Indications: pain Details: 22 G needle  Arthrogram: No  Medications (Right): 2 mL lidocaine 1 %; 2 mL bupivacaine 0.5 %; 40 mg methylPREDNISolone acetate 40 MG/ML Medications (Left): 2 mL lidocaine 1 %; 2 mL bupivacaine 0.5 %; 40 mg methylPREDNISolone acetate 40 MG/ML Outcome: tolerated well, no immediate complications Patient was prepped and draped in the usual sterile fashion.      Clinical Data: No additional findings.   Subjective: Chief Complaint  Patient presents with   Left Knee - Pain   Right Knee - Pain    HPI Tabitha Moore returns today for follow-up bilateral knee pain right greater than left.  She has had pain for over a year and in the right knee and then 6 months in the left knee.  Previous injections in both knees which provided relief.  She is hoping to repeat these injections today.  Denies any changes otherwise.  Review of Systems   Objective: Vital Signs: There were no vitals taken for this visit.  Physical Exam  Ortho Exam Bilateral knee exams show significant patellofemoral crepitus with  range of motion.  Collaterals and cruciates are stable.  No joint effusion. Specialty Comments:  No specialty comments available.  Imaging: No results found.   PMFS History: Patient Active Problem List   Diagnosis Date Noted   Abnormal stress test 01/07/2019   Nonrheumatic aortic valve insufficiency 01/07/2019   Exertional dyspnea 11/04/2018   Dizziness 11/04/2018   Hallux rigidus, bilateral 05/05/2018   High risk medication use 11/17/2016   DDD (degenerative disc disease), lumbar 11/17/2016   Primary osteoarthritis of both knees 11/13/2016   Pain in left ankle and joints of left foot 11/13/2016   Essential hypertension 10/18/2016   Left ventricular hypertrophy 10/18/2016   PVC's (premature ventricular contractions) 10/18/2016   History of diabetes mellitus 10/18/2016   History of hypothyroidism 10/18/2016   Dyslipidemia 10/18/2016   Obesity (BMI 35.0-39.9 without comorbidity) 10/18/2016   History of GI diverticular bleed 10/18/2016   Stage 4 chronic kidney disease ([place]) 10/18/2016   Chorioretinitis of both eyes 10/18/2016   Internal and external prolapsed hemorrhoids, bleeding. 10/05/2011   Past Medical History:  Diagnosis Date   Arthritis    Complication of anesthesia    pt states "hard to wake up"   Diabetes mellitus    Hemorrhoids    Hyperlipidemia    Hypertension    Hypothyroidism  Retinal cyst     Family History  Problem Relation Age of Onset   Cancer Father        lung   Cancer Sister        breast    Past Surgical History:  Procedure Laterality Date   ABDOMINAL HYSTERECTOMY     BREAST LUMPECTOMY WITH RADIOACTIVE SEED LOCALIZATION Left 08/27/2014   Procedure: BREAST LUMPECTOMY WITH RADIOACTIVE SEED LOCALIZATION;  Surgeon: Autumn Messing III, MD;  Location: [facility];  Service: General;  Laterality: Left;   CATARACT EXTRACTION     CHOLECYSTECTOMY     Social History   Occupational History   Not on file  Tobacco Use   Smoking status:  Never   Smokeless tobacco: Never  Vaping Use   Vaping Use: Never used  Substance and Sexual Activity   Alcohol use: No   Drug use: Never   Sexual activity: Not on file

## 2021-01-04 DIAGNOSIS — Z23 Encounter for immunization: Secondary | ICD-10-CM | POA: Diagnosis not present

## 2021-02-13 DIAGNOSIS — H209 Unspecified iridocyclitis: Secondary | ICD-10-CM | POA: Diagnosis not present

## 2021-02-13 DIAGNOSIS — N1831 Chronic kidney disease, stage 3a: Secondary | ICD-10-CM | POA: Diagnosis not present

## 2021-02-13 DIAGNOSIS — E039 Hypothyroidism, unspecified: Secondary | ICD-10-CM | POA: Diagnosis not present

## 2021-02-13 DIAGNOSIS — I129 Hypertensive chronic kidney disease with stage 1 through stage 4 chronic kidney disease, or unspecified chronic kidney disease: Secondary | ICD-10-CM | POA: Diagnosis not present

## 2021-02-13 DIAGNOSIS — I77819 Aortic ectasia, unspecified site: Secondary | ICD-10-CM | POA: Diagnosis not present

## 2021-02-13 DIAGNOSIS — E11319 Type 2 diabetes mellitus with unspecified diabetic retinopathy without macular edema: Secondary | ICD-10-CM | POA: Diagnosis not present

## 2021-02-13 DIAGNOSIS — I351 Nonrheumatic aortic (valve) insufficiency: Secondary | ICD-10-CM | POA: Diagnosis not present

## 2021-02-13 DIAGNOSIS — E1140 Type 2 diabetes mellitus with diabetic neuropathy, unspecified: Secondary | ICD-10-CM | POA: Diagnosis not present

## 2021-02-13 DIAGNOSIS — I429 Cardiomyopathy, unspecified: Secondary | ICD-10-CM | POA: Diagnosis not present

## 2021-02-13 DIAGNOSIS — D899 Disorder involving the immune mechanism, unspecified: Secondary | ICD-10-CM | POA: Diagnosis not present

## 2021-02-13 DIAGNOSIS — E1122 Type 2 diabetes mellitus with diabetic chronic kidney disease: Secondary | ICD-10-CM | POA: Diagnosis not present

## 2021-04-21 ENCOUNTER — Other Ambulatory Visit: Payer: Self-pay

## 2021-04-21 ENCOUNTER — Ambulatory Visit (HOSPITAL_COMMUNITY)
Admission: EM | Admit: 2021-04-21 | Discharge: 2021-04-21 | Disposition: A | Discharge status: Home / Self Care | Payer: Medicare Other

## 2021-04-21 ENCOUNTER — Encounter (HOSPITAL_COMMUNITY): Payer: Self-pay

## 2021-04-21 DIAGNOSIS — H6123 Impacted cerumen, bilateral: Secondary | ICD-10-CM | POA: Diagnosis not present

## 2021-04-21 MED ORDER — MECLIZINE HCL 12.5 MG PO TABS: 12.5000 mg | ORAL_TABLET | Freq: Three times a day (TID) | ORAL | 0 refills | Status: AC | PRN

## 2021-04-21 NOTE — ED Triage Notes (Signed)
Pt presents with c/o dizziness and R ear pain x 2 days.   Pt states she has not been able to sleep well.

## 2021-04-21 NOTE — Discharge Instructions (Addendum)
-  Follow-up with ear, nose, throat doctor if symptoms persist - information below, or your primary care doctor can send a referral. -Meclizine as needed for dizziness, this medication can cause drowsiness.

## 2021-04-21 NOTE — ED Provider Notes (Signed)
[place]    CSN: [id number] Arrival date & time: 04/21/21  6144      History   Chief Complaint Chief Complaint  Patient presents with   Dizziness   Ear Pain    HPI Tabitha Moore is a 81 y.o. female presenting with dizziness and R ear pain x2 days.  Medical history diabetes, hypertension. Describes 2 days of right ear pain, muffled hearing, sensation of room spinning with movement.  States the dizziness only happens when she is laying on her side, no current dizziness.  Has been told that she has cerumen impaction in the past, but it has never bothered her like this before.  Denies recent URI, cough, congestion, fever/chills.  HPI  Past Medical History:  Diagnosis Date   Arthritis    Complication of anesthesia    pt states "hard to wake up"   Diabetes mellitus    Hemorrhoids    Hyperlipidemia    Hypertension    Hypothyroidism    Retinal cyst     Patient Active Problem List   Diagnosis Date Noted   Abnormal stress test 01/07/2019   Nonrheumatic aortic valve insufficiency 01/07/2019   Exertional dyspnea 11/04/2018   Dizziness 11/04/2018   Hallux rigidus, bilateral 05/05/2018   High risk medication use 11/17/2016   DDD (degenerative disc disease), lumbar 11/17/2016   Primary osteoarthritis of both knees 11/13/2016   Pain in left ankle and joints of left foot 11/13/2016   Essential hypertension 10/18/2016   Left ventricular hypertrophy 10/18/2016   PVC's (premature ventricular contractions) 10/18/2016   History of diabetes mellitus 10/18/2016   History of hypothyroidism 10/18/2016   Dyslipidemia 10/18/2016   Obesity (BMI 35.0-39.9 without comorbidity) 10/18/2016   History of GI diverticular bleed 10/18/2016   Stage 4 chronic kidney disease ([place]) 10/18/2016   Chorioretinitis of both eyes 10/18/2016   Internal and external prolapsed hemorrhoids, bleeding. 10/05/2011    Past Surgical History:  Procedure Laterality Date   ABDOMINAL HYSTERECTOMY      BREAST LUMPECTOMY WITH RADIOACTIVE SEED LOCALIZATION Left 08/27/2014   Procedure: BREAST LUMPECTOMY WITH RADIOACTIVE SEED LOCALIZATION;  Surgeon: Autumn Messing III, MD;  Location: [facility];  Service: General;  Laterality: Left;   CATARACT EXTRACTION     CHOLECYSTECTOMY      OB History   No obstetric history on file.      Home Medications    Prior to Admission medications   Medication Sig Start Date End Date Taking? Authorizing Provider  amLODipine (NORVASC) 5 MG tablet Take 5 mg by mouth daily.    Yes [provider]  meclizine (ANTIVERT) 12.5 MG tablet Take 1 tablet (12.5 mg total) by mouth 3 (three) times daily as needed for dizziness. 04/21/21  Yes Hazel Sams, PA-C  acetaminophen (TYLENOL) 325 MG tablet Take 650 mg by mouth every 6 (six) hours as needed for mild pain.    [provider]  acetaminophen (TYLENOL) 500 MG tablet Take 1,000 mg by mouth every 6 (six) hours as needed for mild pain.    [provider]  azaTHIOprine (IMURAN) 50 MG tablet Take by mouth. 07/24/19   [provider]  Bacillus Coagulans-Inulin (PROBIOTIC FORMULA PO) Take 1 tablet by mouth daily.     [provider]  baclofen (LIORESAL) 10 MG tablet Take 1/2 to 1 by mouth every 8hrs as needed for spasm 04/21/20   Magnus Sinning, MD  BENICAR 40 MG tablet Take 40 mg by mouth daily.  09/12/11  [provider]  Cholecalciferol (VITAMIN D PO) Take 1,000 mg by mouth daily.     [provider]  CRESTOR 5 MG tablet Take 5 mg by mouth every morning.  09/21/11   [provider]  doxycycline (VIBRAMYCIN) 100 MG capsule Take 1 capsule (100 mg total) by mouth 2 (two) times daily. 11/06/19   Hilts, Legrand Como, MD  furosemide (LASIX) 20 MG tablet Take 1 tablet (20 mg total) by mouth daily for 5 days. 11/03/19 11/08/19  Darr, Edison Nasuti, PA-C  gabapentin (NEURONTIN) 100 MG capsule Take 100 mg by mouth at bedtime. 09/24/19   [provider]  Ginger,  Zingiber officinalis, (GINGER EXTRACT PO) Take 1 tablet by mouth daily.     [provider]  glimepiride (AMARYL) 4 MG tablet Take 4 mg by mouth daily. 07/06/11   [provider]  levothyroxine (SYNTHROID, LEVOTHROID) 88 MCG tablet Take 88 mcg by mouth daily.  10/01/11   [provider]  metFORMIN (GLUCOPHAGE) 500 MG tablet Take 500 mg by mouth every morning.  03/03/18   [provider]  omega-3 acid ethyl esters (LOVAZA) 1 g capsule Take 1 g by mouth daily.    [provider]  omeprazole (PRILOSEC) 40 MG capsule Take 40 mg by mouth daily.  02/03/18   [provider]  [facility] ULTRA test strip 2 (two) times daily. 10/20/19   [provider]  Turmeric 400 MG CAPS Take 1 capsule by mouth daily.     [provider]    Family History Family History  Problem Relation Age of Onset   Cancer Father        lung   Cancer Sister        breast    Social History Social History   Tobacco Use   Smoking status: Never   Smokeless tobacco: Never  Vaping Use   Vaping Use: Never used  Substance Use Topics   Alcohol use: No   Drug use: Never     Allergies   Shellfish allergy   Review of Systems Review of Systems  HENT:  Positive for ear pain.   Neurological:  Positive for dizziness.    Physical Exam Triage Vital Signs ED Triage Vitals  Enc Vitals Group     BP      Pulse      Resp      Temp      Temp src      SpO2      Weight      Height      Head Circumference      Peak Flow      Pain Score      Pain Loc      Pain Edu?      Excl. in [place]?    No data found.  Updated Vital Signs BP (!) 169/91 (BP Location: Right Arm)    Pulse 82    Temp 98.1 F (36.7 C) (Oral)    Resp 16    SpO2 94%   Visual Acuity Right Eye Distance:   Left Eye Distance:   Bilateral Distance:    Right Eye Near:   Left Eye Near:    Bilateral Near:     Physical Exam Vitals reviewed.  Constitutional:      Appearance: Normal  appearance. She is not ill-appearing.  HENT:     Head: Normocephalic and atraumatic.     Right Ear: Hearing, tympanic membrane, ear canal and external ear normal. No swelling  or tenderness. No middle ear effusion. There is impacted cerumen. No mastoid tenderness. Tympanic membrane is not injected, scarred, perforated, erythematous, retracted or bulging.     Left Ear: Hearing, tympanic membrane, ear canal and external ear normal. No swelling or tenderness.  No middle ear effusion. There is impacted cerumen. No mastoid tenderness. Tympanic membrane is not injected, scarred, perforated, erythematous, retracted or bulging.     Ears:     Comments: Bilateral TMs initially fully occluded by cerumen. Following lavage, TMs appear healthy and intact.     Mouth/Throat:     Pharynx: Oropharynx is clear. No oropharyngeal exudate or posterior oropharyngeal erythema.  Cardiovascular:     Rate and Rhythm: Normal rate and regular rhythm.     Heart sounds: Normal heart sounds.  Pulmonary:     Effort: Pulmonary effort is normal.     Breath sounds: Normal breath sounds.  Lymphadenopathy:     Cervical: No cervical adenopathy.  Neurological:     General: No focal deficit present.     Mental Status: She is alert and oriented to person, place, and time.     Comments: CN 2-12 grossly intact, PERRLA, EOMI.   Psychiatric:        Mood and Affect: Mood normal.        Behavior: Behavior normal.        Thought Content: Thought content normal.        Judgment: Judgment normal.     UC Treatments / Results  Labs (all labs ordered are listed, but only abnormal results are displayed) Labs Reviewed - No data to display  EKG   Radiology No results found.  Procedures Procedures (including critical care time)  Medications Ordered in UC Medications - No data to display  Initial Impression / Assessment and Plan / UC Course  I have reviewed the triage vital signs and the nursing notes.  Pertinent labs &  imaging results that were available during my care of the patient were reviewed by me and considered in my medical decision making (see chart for details).     This patient is a very pleasant 81 y.o. year old female presenting with BPPV and cerumen impaction. Lavage performed by nurse with some improvement. Meclizine sent f/u with ENT.  ED return precautions discussed. Patient verbalizes understanding and agreement.    Final Clinical Impressions(s) / UC Diagnoses   Final diagnoses:  Bilateral impacted cerumen     Discharge Instructions      -Follow-up with ear, nose, throat doctor if symptoms persist - information below, or your primary care doctor can send a referral. -Meclizine as needed for dizziness, this medication can cause drowsiness.     ED Prescriptions     Medication Sig Dispense Auth. Provider   meclizine (ANTIVERT) 12.5 MG tablet Take 1 tablet (12.5 mg total) by mouth 3 (three) times daily as needed for dizziness. 30 tablet Hazel Sams, PA-C      PDMP not reviewed this encounter.   Hazel Sams, PA-C 04/21/21 1138

## 2021-04-27 ENCOUNTER — Other Ambulatory Visit: Payer: Self-pay | Admitting: Adult Health

## 2021-04-27 DIAGNOSIS — R448 Other symptoms and signs involving general sensations and perceptions: Secondary | ICD-10-CM | POA: Diagnosis not present

## 2021-04-27 DIAGNOSIS — R42 Dizziness and giddiness: Secondary | ICD-10-CM

## 2021-04-27 DIAGNOSIS — I129 Hypertensive chronic kidney disease with stage 1 through stage 4 chronic kidney disease, or unspecified chronic kidney disease: Secondary | ICD-10-CM | POA: Diagnosis not present

## 2021-04-27 DIAGNOSIS — E11319 Type 2 diabetes mellitus with unspecified diabetic retinopathy without macular edema: Secondary | ICD-10-CM | POA: Diagnosis not present

## 2021-04-27 DIAGNOSIS — N1831 Chronic kidney disease, stage 3a: Secondary | ICD-10-CM | POA: Diagnosis not present

## 2021-04-28 ENCOUNTER — Ambulatory Visit (HOSPITAL_COMMUNITY)
Admission: RE | Admit: 2021-04-28 | Discharge: 2021-04-28 | Disposition: A | Discharge status: Home / Self Care | Payer: Medicare Other | Source: Ambulatory Visit | Attending: Adult Health | Admitting: Adult Health

## 2021-04-28 ENCOUNTER — Other Ambulatory Visit: Payer: Self-pay

## 2021-04-28 DIAGNOSIS — R448 Other symptoms and signs involving general sensations and perceptions: Secondary | ICD-10-CM | POA: Diagnosis not present

## 2021-04-28 DIAGNOSIS — R42 Dizziness and giddiness: Secondary | ICD-10-CM | POA: Diagnosis not present

## 2021-04-28 DIAGNOSIS — G319 Degenerative disease of nervous system, unspecified: Secondary | ICD-10-CM | POA: Diagnosis not present

## 2021-04-28 MED ORDER — GADOBUTROL 1 MMOL/ML IV SOLN
8.9000 mL | Freq: Once | INTRAVENOUS | Status: AC | PRN
  Administered 2021-04-28: 8.9 mL via INTRAVENOUS

## 2021-06-22 NOTE — Patient Outreach (Addendum)
Received a referral notification for Ms. Oakey. ?I have assigned [name], RN to call for follow up and determine if there are any Case Management needs.  ?  ?[place]ville Care, CBCS, CMAA ?[place] Management Assistant ?[place] Management ?[phone]  ?

## 2021-06-27 ENCOUNTER — Other Ambulatory Visit: Payer: Self-pay | Admitting: *Deleted

## 2021-06-27 ENCOUNTER — Encounter: Payer: Self-pay | Admitting: *Deleted

## 2021-06-27 ENCOUNTER — Other Ambulatory Visit: Payer: Medicare Other | Admitting: *Deleted

## 2021-06-27 DIAGNOSIS — M859 Disorder of bone density and structure, unspecified: Secondary | ICD-10-CM | POA: Diagnosis not present

## 2021-06-27 DIAGNOSIS — E039 Hypothyroidism, unspecified: Secondary | ICD-10-CM | POA: Diagnosis not present

## 2021-06-27 DIAGNOSIS — E1140 Type 2 diabetes mellitus with diabetic neuropathy, unspecified: Secondary | ICD-10-CM | POA: Diagnosis not present

## 2021-06-27 DIAGNOSIS — E785 Hyperlipidemia, unspecified: Secondary | ICD-10-CM | POA: Diagnosis not present

## 2021-06-27 NOTE — Patient Outreach (Signed)
[facility]) Care Management ? ?06/27/2021 ? ?Koren Bound ?[birth year] ?[id number] ? ? ?Telephone Assessment-Unsuccessful ? ?Pt requested a call back from earlier call today however call was unsuccessful. RN will attempt another outreach call over the next week fop pending [facility] services. ? ?Will further intervene at that time for possible [facility] enrollment. ? ?[name], RN ?Care Management Coordinator ?[place] ?Main Office [phone]  ?

## 2021-06-27 NOTE — Patient Outreach (Signed)
[facility]) Care Management ? ?06/27/2021 ? ?Tabitha Moore ?09-25-1940 ?[id number] ? ? ?Telephone Assessment ?Referral Received: 3/16 ?Initial Outreach: 3/21 ? ?RN spoke with pt today and introduced [facility] services. Pt interested however requested a call back later today. ? ?RN will follow up according for possible enrollment into the [facility] program and services. ? ?[name], RN ?Care Management Coordinator ?[place] ?Main Office [phone]  ?

## 2021-07-03 ENCOUNTER — Other Ambulatory Visit: Payer: Self-pay | Admitting: *Deleted

## 2021-07-03 NOTE — Patient Outreach (Signed)
[facility]) Care Management ? ?07/03/2021 ? ?Tabitha Moore ?1940/07/21 ?[id number] ? ?Telephone Assessment-RE: Call Back  ? ?RN spoke with pt today concerning enrollment into the [facility] program and services at discussed on the last call. Pt has requested a call back later this week for entry into the [facility] program and services. ? ?RN will follow up on Wednesday as requested for enrollment. ? ?[name], RN ?Care Management Coordinator ?[place] ?Main Office [phone]  ? ?

## 2021-07-04 DIAGNOSIS — M8589 Other specified disorders of bone density and structure, multiple sites: Secondary | ICD-10-CM | POA: Diagnosis not present

## 2021-07-04 DIAGNOSIS — E785 Hyperlipidemia, unspecified: Secondary | ICD-10-CM | POA: Diagnosis not present

## 2021-07-04 DIAGNOSIS — R82998 Other abnormal findings in urine: Secondary | ICD-10-CM | POA: Diagnosis not present

## 2021-07-04 DIAGNOSIS — E039 Hypothyroidism, unspecified: Secondary | ICD-10-CM | POA: Diagnosis not present

## 2021-07-04 DIAGNOSIS — I77819 Aortic ectasia, unspecified site: Secondary | ICD-10-CM | POA: Diagnosis not present

## 2021-07-04 DIAGNOSIS — Z1331 Encounter for screening for depression: Secondary | ICD-10-CM | POA: Diagnosis not present

## 2021-07-04 DIAGNOSIS — Z1389 Encounter for screening for other disorder: Secondary | ICD-10-CM | POA: Diagnosis not present

## 2021-07-04 DIAGNOSIS — E1122 Type 2 diabetes mellitus with diabetic chronic kidney disease: Secondary | ICD-10-CM | POA: Diagnosis not present

## 2021-07-04 DIAGNOSIS — I129 Hypertensive chronic kidney disease with stage 1 through stage 4 chronic kidney disease, or unspecified chronic kidney disease: Secondary | ICD-10-CM | POA: Diagnosis not present

## 2021-07-04 DIAGNOSIS — N1831 Chronic kidney disease, stage 3a: Secondary | ICD-10-CM | POA: Diagnosis not present

## 2021-07-04 DIAGNOSIS — E871 Hypo-osmolality and hyponatremia: Secondary | ICD-10-CM | POA: Diagnosis not present

## 2021-07-04 DIAGNOSIS — Z0000 Encounter for general adult medical examination without abnormal findings: Secondary | ICD-10-CM | POA: Diagnosis not present

## 2021-07-04 DIAGNOSIS — E1140 Type 2 diabetes mellitus with diabetic neuropathy, unspecified: Secondary | ICD-10-CM | POA: Diagnosis not present

## 2021-07-04 DIAGNOSIS — G629 Polyneuropathy, unspecified: Secondary | ICD-10-CM | POA: Diagnosis not present

## 2021-07-05 ENCOUNTER — Other Ambulatory Visit: Payer: Self-pay | Admitting: *Deleted

## 2021-07-05 ENCOUNTER — Encounter: Payer: Self-pay | Admitting: *Deleted

## 2021-07-05 NOTE — Patient Outreach (Signed)
?[facility]) Care Management ?Telephonic RN Care Manager Note ? ? ?07/05/2021 ?Name:  PATTIJO Moore MRN:  [id number] DOB:  19-May-1940 ? ?Summary: ?Pr enrolled into the [facility] program and services. Pt receptive to all educations and managing her blood pressure with twice weekly monitoring. [facility] will send pt a scale and blood pressures monitoring device for ongoing management of care. Pt has also requested an A.D packet. ? ?Recommendations/Changes made from today's visit: ?To follow the discussed plan of care and monitoring her blood pressure twice week documenting all readings in the calendar for ongoing review. ? ?Subjective: ?Tabitha Moore is an 81 y.o. year old female who is a primary patient of Shon Baton, MD. The care management team was consulted for assistance with care management and/or care coordination needs.   ? ?Telephonic RN Care Manager completed Telephone Visit today. ? ?Objective:  ? ?Medications Reviewed Today   ? ? Reviewed by Tobi Bastos, RN (Registered Nurse) on 07/05/21 at 1238  Med List Status: <None>  ? ?Medication Order Taking? Sig Documenting Provider Last Dose Status Informant  ?acetaminophen (TYLENOL) 325 MG tablet [id number] Yes Take 650 mg by mouth every 6 (six) hours as needed for mild pain. [provider] Taking Active Self  ?acetaminophen (TYLENOL) 500 MG tablet [id number] Yes Take 1,000 mg by mouth every 6 (six) hours as needed for mild pain. [provider] Taking Active Self  ?amLODipine (NORVASC) 5 MG tablet [id number] Yes Take 5 mg by mouth daily. Take differently 2.5 mg (5 mg). [provider] Taking Active Self  ?azaTHIOprine (IMURAN) 50 MG tablet [id number] No Take by mouth.  ?Patient not taking: Reported on 07/05/2021  ? [provider] Not Taking Active   ?Bacillus Coagulans-Inulin (PROBIOTIC FORMULA PO) [id number] Yes Take 1 tablet by mouth daily.  [provider] Taking Active Self  ?baclofen (LIORESAL) 10 MG  tablet [id number] No Take 1/2 to 1 by mouth every 8hrs as needed for spasm  ?Patient not taking: Reported on 07/05/2021  ? Magnus Sinning, MD Not Taking Active   ?BENICAR 40 MG tablet [id number] Yes Take 40 mg by mouth daily.  [provider] Taking Active Self  ?Cholecalciferol (VITAMIN D PO) [id number] Yes Take 1,000 mg by mouth daily.  [provider] Taking Active Self  ?CRESTOR 5 MG tablet [id number] Yes Take 5 mg by mouth every morning.  [provider] Taking Active Self  ?dapagliflozin propanediol (FARXIGA) 5 MG TABS tablet [id number] Yes Take 5 mg by mouth every other day. [provider] Taking Active   ?doxycycline (VIBRAMYCIN) 100 MG capsule [id number] No Take 1 capsule (100 mg total) by mouth 2 (two) times daily.  ?Patient not taking: Reported on 07/05/2021  ? Hilts, Michael, MD Not Taking Active   ?furosemide (LASIX) 20 MG tablet [id number]  Take 1 tablet (20 mg total) by mouth daily for 5 days. Roland Rack, PA-C  Expired 11/08/19 2359   ?gabapentin (NEURONTIN) 100 MG capsule [id number] Yes Take 100 mg by mouth at bedtime. Take different only as needed [provider] Taking Active   ?Ginger, Zingiber officinalis, (GINGER EXTRACT PO) [id number] Yes Take 1 tablet by mouth daily.  [provider] Taking Active Self  ?glimepiride (AMARYL) 4 MG tablet [id number] Yes Take 4 mg by mouth daily. [provider] Taking Active Self  ?levothyroxine (SYNTHROID, LEVOTHROID) 88 MCG tablet [id number] Yes Take 88 mcg by mouth daily.  [provider] Taking Active Self  ?meclizine (ANTIVERT) 12.5 MG tablet  [id number] Yes Take 1 tablet (12.5 mg total) by mouth 3 (three) times daily as needed for dizziness. Hazel Sams, PA-C Taking Active   ?metFORMIN (GLUCOPHAGE) 500 MG tablet [id number] Yes Take 500 mg by mouth every morning.  [provider] Taking Active Self  ?omega-3 acid ethyl esters (LOVAZA) 1 g capsule [id number] Yes Take 1 g by mouth daily.  [provider] Taking Active Self  ?omeprazole (PRILOSEC) 40 MG capsule [id number] Yes Take 40 mg by mouth daily.  [provider] Taking Active Self  ?ONETOUCH ULTRA test strip [id number] Yes 2 (two) times daily. [provider] Taking Active   ?Turmeric 400 MG CAPS [id number] Yes Take 1 capsule by mouth daily.  [provider] Taking Active Self  ? ?  ?  ? ?  ? ? ? ?SDOH:  (Social Determinants of Health) assessments and interventions performed:  ?SDOH Interventions   ? ?Flowsheet Row Most Recent Value  ?SDOH Interventions   ?Food Insecurity Interventions Intervention Not Indicated  ?Transportation Interventions Intervention Not Indicated  ? ?  ? ? ? ?Care Plan ? ?Review of patient past medical history, allergies, medications, health status, including review of consultants reports, laboratory and other test data, was performed as part of comprehensive evaluation for care management services.  ? ?Care Plan : RN Care Manager Plan of Care  ?Updates made by Tobi Bastos, RN since 07/05/2021 12:00 AM  ?  ? ?Problem: Knowledge   ?Priority: High  ?  ? ?Problem: Knowledge Deficit related to HTN and care coordination needs   ?Priority: High  ?  ? ?Long-Range Goal: Development plan of care for management of HTN   ?Start Date: 07/05/2021  ?Expected End Date: 01/05/2022  ?Priority: High  ?Note:   ?Current Barriers:  ?Knowledge Deficits related to plan of care for management of HTN  ? ?RNCM Clinical Goal(s):  ?Patient will verbalize understanding of plan for management of HTN as evidenced by teach-back method of understanding and chart review ?verbalize basic understanding of  HTN disease process and self health management plan as evidenced by self reports. ?take all medications exactly as prescribed and will call provider for medication related questions as evidenced by self reporting an chart reviews  through collaboration with RN Care manager, provider, and care team.   ? ?Interventions: ?Inter-disciplinary care team collaboration (see longitudinal plan of care) ?Evaluation of current treatment plan related to  self management and patient's adherence to plan as established by provider ? ? ?Hypertension Interventions:  (Status:  New goal. and Goal on track:  Yes.) Long Term Goal ?Last practice recorded BP readings:  ?BP Readings from Last 3 Encounters:  ?04/21/21 (!) 169/91  ?04/21/20 (!) 147/84  ?02/08/20 (!) 151/72  ?Most recent eGFR/CrCl: No results found for: EGFR  No components found for: CRCL ? ?Take all medications as prescribed ?Attend all scheduled provider appointments ?Call pharmacy for medication refills 3-7 days in advance of running out of medications ?Attend church or other social activities ?Perform all self care activities independently  ?Perform IADL's (shopping, preparing meals, housekeeping, managing finances) independently ?Call provider office for new concerns or questions  ?check blood pressure weekly ?choose a place to take my blood pressure (home, clinic or office, retail store) ?write blood pressure results in a log or diary ?learn about high blood pressure ?keep a blood pressure log ?take blood pressure log to all doctor appointments ?call doctor for signs and symptoms of high blood pressure ?keep all doctor appointments ?take medications for  blood pressure exactly as prescribed ?report new symptoms to your doctor ?eat more whole grains, fruits and vegetables, lean meats and healthy fats ? ?Evaluation of current treatment plan related to hypertension self management and patient's adherence to plan as established by provider ?Provided education to patient re: stroke prevention, s/s of heart attack and stroke ?Reviewed medications with patient and discussed importance of compliance ?Counseled on adverse effects of illicit drug and excessive alcohol use in patients with high blood pressure  ?Provided assistance with obtaining home blood pressure monitor via  [facility] ; ?Discussed plans with patient for ongoing care management follow up and provided patient with direct contact information for care management team ?Advised patient, providing education and rationale, to monitor blood pressure

## 2021-07-05 NOTE — Patient Instructions (Signed)
Visit Information ? ?Thank you for taking time to visit with me today. Please don't hesitate to contact me if I can be of assistance to you before our next scheduled telephone appointment. ? ?Following are the goals we discussed today:  ? Take all medications as prescribed ?Attend all scheduled provider appointments ?Call pharmacy for medication refills 3-7 days in advance of running out of medications ?Attend church or other social activities ?Perform all self care activities independently  ?Perform IADL's (shopping, preparing meals, housekeeping, managing finances) independently ?Call provider office for new concerns or questions  ?check blood pressure weekly ?choose a place to take my blood pressure (home, clinic or office, retail store) ?write blood pressure results in a log or diary ?learn about high blood pressure ?keep a blood pressure log ?take blood pressure log to all doctor appointments ?call doctor for signs and symptoms of high blood pressure ?keep all doctor appointments ?take medications for blood pressure exactly as prescribed ?report new symptoms to your doctor ?eat more whole grains, fruits and vegetables, lean meats and healthy fats ? ?

## 2021-08-01 DIAGNOSIS — R42 Dizziness and giddiness: Secondary | ICD-10-CM | POA: Diagnosis not present

## 2021-08-01 DIAGNOSIS — H903 Sensorineural hearing loss, bilateral: Secondary | ICD-10-CM | POA: Diagnosis not present

## 2021-08-01 DIAGNOSIS — H6122 Impacted cerumen, left ear: Secondary | ICD-10-CM | POA: Diagnosis not present

## 2021-08-01 DIAGNOSIS — H938X1 Other specified disorders of right ear: Secondary | ICD-10-CM | POA: Diagnosis not present

## 2021-08-02 ENCOUNTER — Other Ambulatory Visit: Payer: Self-pay | Admitting: *Deleted

## 2021-08-02 NOTE — Patient Outreach (Signed)
[facility]) Care Management ? ?08/02/2021 ? ?Tabitha Moore ?[birth year] ?[id number] ? ? ?Telephone Assessment-RE: Call Back ? ?RN spoke briefly with pt today however pt was visiting someone at the nursing home and this was not a good time to receive an update. Offered to call back another day this week (receptive).  ? ?Will follow up once again in a few days for ongoing [facility] service. ? ?[name], RN ?Care Management Coordinator ?[place] ?Main Office [phone]  ?

## 2021-08-04 ENCOUNTER — Other Ambulatory Visit: Payer: Self-pay | Admitting: *Deleted

## 2021-08-04 NOTE — Patient Outreach (Signed)
[facility]) Care Management Telephonic RN Care Manager Note   08/04/2021 Name:  Tabitha Moore MRN:  [id number] DOB:  08-09-1940  Summary: Pt received the [facility] packet however limited with readings over the last month. Pt provider partial readings but verified no precipitating symptoms encountered. Pt aware of what to with elevated readings and the ranges for normal vs acute readings. Will continue to encourage adherence with BP monitoring.  Recommendations/Changes made from today's visit: Encouraged pt to obtain weekly BP and document all readings in the [facility] calendar for providers to view. Will continue to encourage pt on plan of care and self monitoring.   Subjective: Tabitha Moore is an 81 y.o. year old female who is a primary patient of Creola Corn, MD. The care management team was consulted for assistance with care management and/or care coordination needs.    Telephonic RN Care Manager completed Telephone Visit today.  Objective:   Medications Reviewed Today     Reviewed by Alejandro Mulling, RN (Registered Nurse) on 07/05/21 at 1238  Med List Status: <None>   Medication Order Taking? Sig Documenting Provider Last Dose Status Informant  acetaminophen (TYLENOL) 325 MG tablet [id number] Yes Take 650 mg by mouth every 6 (six) hours as needed for mild pain. [provider] Taking Active Self  acetaminophen (TYLENOL) 500 MG tablet [id number] Yes Take 1,000 mg by mouth every 6 (six) hours as needed for mild pain. [provider] Taking Active Self  amLODipine (NORVASC) 5 MG tablet [id number] Yes Take 5 mg by mouth daily. Take differently 2.5 mg (5 mg). [provider] Taking Active Self  azaTHIOprine (IMURAN) 50 MG tablet [id number] No Take by mouth.  Patient not taking: Reported on 07/05/2021   [provider] Not Taking Active   Bacillus Coagulans-Inulin (PROBIOTIC FORMULA PO) [id number] Yes Take 1 tablet by mouth daily.  [provider] Taking Active Self  baclofen (LIORESAL) 10 MG tablet [id number] No Take 1/2 to 1 by mouth every 8hrs as needed for spasm  Patient not taking: Reported on 07/05/2021   Tyrell Antonio, MD Not Taking Active   BENICAR 40 MG tablet [id number] Yes Take 40 mg by mouth daily.  [provider] Taking Active Self  Cholecalciferol (VITAMIN D PO) [id number] Yes Take 1,000 mg by mouth daily.  [provider] Taking Active Self  CRESTOR 5 MG tablet [id number] Yes Take 5 mg by mouth every morning.  [provider] Taking Active Self  dapagliflozin propanediol (FARXIGA) 5 MG TABS tablet [id number] Yes Take 5 mg by mouth every other day. [provider] Taking Active   doxycycline (VIBRAMYCIN) 100 MG capsule [id number] No Take 1 capsule (100 mg total) by mouth 2 (two) times daily.  Patient not taking: Reported on 07/05/2021   Hilts, Casimiro Needle, MD Not Taking Active   furosemide (LASIX) 20 MG tablet [id number]  Take 1 tablet (20 mg total) by mouth daily for 5 days. Cam Hai, PA-C  Expired 11/08/19 2359   gabapentin (NEURONTIN) 100 MG capsule [id number] Yes Take 100 mg by mouth at bedtime. Take different only as needed [provider] Taking Active   Ginger, Zingiber officinalis, (GINGER EXTRACT PO) [id number] Yes Take 1 tablet by mouth daily.  [provider] Taking Active Self  glimepiride (AMARYL) 4 MG tablet [id number] Yes Take 4 mg by mouth daily. [provider] Taking Active Self  levothyroxine (SYNTHROID, LEVOTHROID) 88 MCG tablet [id number] Yes Take 88 mcg by mouth daily.  [provider] Taking Active Self  meclizine (ANTIVERT) 12.5 MG tablet [id number] Yes Take 1 tablet (12.5 mg total) by mouth 3 (three) times daily as needed for dizziness. Rhys Martini, PA-C Taking Active   metFORMIN (GLUCOPHAGE) 500 MG tablet [id number] Yes Take 500 mg by mouth every morning.  [provider] Taking Active Self  omega-3 acid ethyl esters  (LOVAZA) 1 g capsule [id number] Yes Take 1 g by mouth daily. [provider] Taking Active Self  omeprazole (PRILOSEC) 40 MG capsule [id number] Yes Take 40 mg by mouth daily.  [provider] Taking Active Self  Koren Bound test strip [id number] Yes 2 (two) times daily. [provider] Taking Active   Turmeric 400 MG CAPS [id number] Yes Take 1 capsule by mouth daily.  [provider] Taking Active Self             SDOH:  (Social Determinants of Health) assessments and interventions performed:     Care Plan  Review of patient past medical history, allergies, medications, health status, including review of consultants reports, laboratory and other test data, was performed as part of comprehensive evaluation for care management services.   Care Plan : RN Care Manager Plan of Care  Updates made by Alejandro Mulling, RN since 08/04/2021 12:00 AM     Problem: Knowledge Deficit related to HTN and care coordination needs   Priority: High     Long-Range Goal: Development plan of care for management of HTN   Start Date: 07/05/2021  Expected End Date: 01/05/2022  This Visit's Progress: On track  Priority: High  Note:   Current Barriers:  Knowledge Deficits related to plan of care for management of HTN   RNCM Clinical Goal(s):  Patient will verbalize understanding of plan for management of HTN as evidenced by teach-back method of understanding and chart review verbalize basic understanding of  HTN disease process and self health management plan as evidenced by self reports. take all medications exactly as prescribed and will call provider for medication related questions as evidenced by self reporting an chart reviews  through collaboration with RN Care manager, provider, and care team.   Interventions: Inter-disciplinary care team collaboration (see longitudinal plan of care) Evaluation of current treatment plan related to  self management and patient's  adherence to plan as established by provider   Hypertension Interventions:  (Status:  New goal. and Goal on track:  Yes.) Long Term Goal Last practice recorded BP readings:  BP Readings from Last 3 Encounters:  04/21/21 (!) 169/91  04/21/20 (!) 147/84  02/08/20 (!) 151/72  Most recent eGFR/CrCl: No results found for: EGFR  No components found for: CRCL  Take all medications as prescribed Attend all scheduled provider appointments Call pharmacy for medication refills 3-7 days in advance of running out of medications Attend church or other social activities Perform all self care activities independently  Perform IADL's (shopping, preparing meals, housekeeping, managing finances) independently Call provider office for new concerns or questions  check blood pressure weekly choose a place to take my blood pressure (home, clinic or office, retail store) write blood pressure results in a log or diary learn about high blood pressure keep a blood pressure log take blood pressure log to all doctor appointments call doctor for signs and symptoms of high blood pressure keep all doctor appointments take medications for blood pressure exactly as prescribed report new symptoms to your doctor eat more whole grains, fruits and vegetables, lean meats and healthy fats  Evaluation  of current treatment plan related to hypertension self management and patient's adherence to plan as established by provider Provided education to patient re: stroke prevention, s/s of heart attack and stroke Reviewed medications with patient and discussed importance of compliance Counseled on adverse effects of illicit drug and excessive alcohol use in patients with high blood pressure  Provided assistance with obtaining home blood pressure monitor via [facility] ; Discussed plans with patient for ongoing care management follow up and provided patient with direct contact information for care management team Advised patient,  providing education and rationale, to monitor blood pressure daily and record, calling PCP for findings outside established parameters Reviewed scheduled/upcoming provider appointments including:  Discussed complications of poorly controlled blood pressure such as heart disease, stroke, circulatory complications, vision complications, kidney impairment, sexual dysfunction Screening for signs and symptoms of depression related to chronic disease state  Update 4/28: Spoke with pt today and inquired on weekly BP monitoring. Pt states she is really just to monitor her blood pressures and will need to have a designated spot in her home to relax and take it correctly. Pt states , "the top number was okay but the bottom number with 88 on one day and another 93 however it was after she was moving". RN educated pt on the importance of weekly monitoring to prevent acute issues from occurring and discussed the plan of care and the appropriate way to take her blood pressure. Pt agreed she will start taking her BP weekly and documenting the readings now that she has the [facility] calendar and appropriate device for monitoring. Pt also aware of abnormal readings and what to do if acute readings or symptoms are encountered. Pt bp device has ranges and alerts when to seek medical attention or call her provider.  Patient Goals/Self-Care Activities: Discussed self monitoring with blood pressures monitoring for twice weekly with b/p device provided.  Follow Up Plan:  Telephone follow up appointment with care management team member scheduled for:  May 2023 The patient has been provided with contact information for the care management team and has been advised to call with any health related questions or concerns.        Elliot Cousin, RN Care Management Coordinator [facility] Main Office [phone]

## 2021-08-14 DIAGNOSIS — H903 Sensorineural hearing loss, bilateral: Secondary | ICD-10-CM | POA: Diagnosis not present

## 2021-08-14 DIAGNOSIS — H838X3 Other specified diseases of inner ear, bilateral: Secondary | ICD-10-CM | POA: Diagnosis not present

## 2021-08-14 DIAGNOSIS — J310 Chronic rhinitis: Secondary | ICD-10-CM | POA: Diagnosis not present

## 2021-08-14 DIAGNOSIS — R42 Dizziness and giddiness: Secondary | ICD-10-CM | POA: Diagnosis not present

## 2021-08-16 DIAGNOSIS — H903 Sensorineural hearing loss, bilateral: Secondary | ICD-10-CM | POA: Diagnosis not present

## 2021-09-05 ENCOUNTER — Other Ambulatory Visit: Payer: Self-pay | Admitting: *Deleted

## 2021-09-05 DIAGNOSIS — R42 Dizziness and giddiness: Secondary | ICD-10-CM | POA: Diagnosis not present

## 2021-09-05 NOTE — Patient Outreach (Signed)
[facility]) Care Management  09/05/2021  Tabitha Moore 1940-07-09 [id number]   Telephone Assessment-Unsuccessful  RN attempted outreach call today however only able to leave a contact to the cell contacted. HIPAA voice message left requesting a call back.  Will reach out once again over the next week for ongoing [facility] services.  [name], RN Care Management Coordinator [place] Office [phone]

## 2021-09-11 ENCOUNTER — Other Ambulatory Visit: Payer: Self-pay | Admitting: *Deleted

## 2021-09-11 NOTE — Patient Outreach (Signed)
[facility]) Care Management Telephonic RN Care Manager Note   09/11/2021 Name:  Tabitha Moore MRN:  [id number] DOB:  12-27-1940  Summary: Pt committed to taking her blood pressure once weekly and document all readings in her [facility] calendar for consist monitoring. Pt denies any acute symptoms at this time and indicated adherence with all her medications.  Recommendations/Changes made from today's visit: Will reiterated on the plan of care and pt's adherence with weekly monitoring. Will follow up next month with pt's adherence.   Subjective: Tabitha Moore is an 81 y.o. year old female who is a primary patient of Shon Baton, MD. The care management team was consulted for assistance with care management and/or care coordination needs.    Telephonic RN Care Manager completed Telephone Visit today.  Objective:   Medications Reviewed Today     Reviewed by Tobi Bastos, RN (Registered Nurse) on 07/05/21 at 1238  Med List Status: <None>   Medication Order Taking? Sig Documenting Provider Last Dose Status Informant  acetaminophen (TYLENOL) 325 MG tablet [id number] Yes Take 650 mg by mouth every 6 (six) hours as needed for mild pain. [provider] Taking Active Self  acetaminophen (TYLENOL) 500 MG tablet [id number] Yes Take 1,000 mg by mouth every 6 (six) hours as needed for mild pain. [provider] Taking Active Self  amLODipine (NORVASC) 5 MG tablet [id number] Yes Take 5 mg by mouth daily. Take differently 2.5 mg (5 mg). [provider] Taking Active Self  azaTHIOprine (IMURAN) 50 MG tablet [id number] No Take by mouth.  Patient not taking: Reported on 07/05/2021   [provider] Not Taking Active   Bacillus Coagulans-Inulin (PROBIOTIC FORMULA PO) [id number] Yes Take 1 tablet by mouth daily.  [provider] Taking Active Self  baclofen (LIORESAL) 10 MG tablet [id number] No Take 1/2 to 1 by mouth every 8hrs as needed for spasm   Patient not taking: Reported on 07/05/2021   Magnus Sinning, MD Not Taking Active   BENICAR 40 MG tablet [id number] Yes Take 40 mg by mouth daily.  [provider] Taking Active Self  Cholecalciferol (VITAMIN D PO) [id number] Yes Take 1,000 mg by mouth daily.  [provider] Taking Active Self  CRESTOR 5 MG tablet [id number] Yes Take 5 mg by mouth every morning.  [provider] Taking Active Self  dapagliflozin propanediol (FARXIGA) 5 MG TABS tablet [id number] Yes Take 5 mg by mouth every other day. [provider] Taking Active   doxycycline (VIBRAMYCIN) 100 MG capsule [id number] No Take 1 capsule (100 mg total) by mouth 2 (two) times daily.  Patient not taking: Reported on 07/05/2021   Hilts, Legrand Como, MD Not Taking Active   furosemide (LASIX) 20 MG tablet [id number]  Take 1 tablet (20 mg total) by mouth daily for 5 days. Roland Rack, PA-C  Expired 11/08/19 2359   gabapentin (NEURONTIN) 100 MG capsule [id number] Yes Take 100 mg by mouth at bedtime. Take different only as needed [provider] Taking Active   Ginger, Zingiber officinalis, (GINGER EXTRACT PO) [id number] Yes Take 1 tablet by mouth daily.  [provider] Taking Active Self  glimepiride (AMARYL) 4 MG tablet [id number] Yes Take 4 mg by mouth daily. [provider] Taking Active Self  levothyroxine (SYNTHROID, LEVOTHROID) 88 MCG tablet [id number] Yes Take 88 mcg by mouth daily.  [provider] Taking Active Self  meclizine (ANTIVERT) 12.5 MG tablet [id number] Yes Take 1 tablet (12.5 mg total) by mouth 3 (  three) times daily as needed for dizziness. Hazel Sams, PA-C Taking Active   metFORMIN (GLUCOPHAGE) 500 MG tablet [id number] Yes Take 500 mg by mouth every morning.  [provider] Taking Active Self  omega-3 acid ethyl esters (LOVAZA) 1 g capsule [id number] Yes Take 1 g by mouth daily. [provider] Taking Active Self  omeprazole (PRILOSEC) 40 MG capsule  [id number] Yes Take 40 mg by mouth daily.  [provider] Taking Active Self  Donald Siva test strip [id number] Yes 2 (two) times daily. [provider] Taking Active   Turmeric 400 MG CAPS [id number] Yes Take 1 capsule by mouth daily.  [provider] Taking Active Self             SDOH:  (Social Determinants of Health) assessments and interventions performed:     Care Plan  Review of patient past medical history, allergies, medications, health status, including review of consultants reports, laboratory and other test data, was performed as part of comprehensive evaluation for care management services.   Care Plan : RN Care Manager Plan of Care  Updates made by Tobi Bastos, RN since 09/11/2021 12:00 AM     Problem: Knowledge Deficit related to HTN and care coordination needs   Priority: High     Long-Range Goal: Development plan of care for management of HTN   Start Date: 07/05/2021  Expected End Date: 01/05/2022  This Visit's Progress: On track  Recent Progress: On track  Priority: High  Note:   Current Barriers:  Knowledge Deficits related to plan of care for management of HTN   RNCM Clinical Goal(s):  Patient will verbalize understanding of plan for management of HTN as evidenced by teach-back method of understanding and chart review verbalize basic understanding of  HTN disease process and self health management plan as evidenced by self reports. take all medications exactly as prescribed and will call provider for medication related questions as evidenced by self reporting an chart reviews  through collaboration with RN Care manager, provider, and care team.   Interventions: Inter-disciplinary care team collaboration (see longitudinal plan of care) Evaluation of current treatment plan related to  self management and patient's adherence to plan as established by provider   Hypertension Interventions:  (Status:  New goal. and Goal on  track:  Yes.) Long Term Goal Last practice recorded BP readings:  BP Readings from Last 3 Encounters:  04/21/21 (!) 169/91  04/21/20 (!) 147/84  02/08/20 (!) 151/72  Most recent eGFR/CrCl: No results found for: EGFR  No components found for: CRCL  Take all medications as prescribed Attend all scheduled provider appointments Call pharmacy for medication refills 3-7 days in advance of running out of medications Attend church or other social activities Perform all self care activities independently  Perform IADL's (shopping, preparing meals, housekeeping, managing finances) independently Call provider office for new concerns or questions  check blood pressure weekly choose a place to take my blood pressure (home, clinic or office, retail store) write blood pressure results in a log or diary learn about high blood pressure keep a blood pressure log take blood pressure log to all doctor appointments call doctor for signs and symptoms of high blood pressure keep all doctor appointments take medications for blood pressure exactly as prescribed report new symptoms to your doctor eat more whole grains, fruits and vegetables, lean meats and healthy fats  Evaluation of current treatment plan related to hypertension self management and patient's adherence to plan as established  by provider Provided education to patient re: stroke prevention, s/s of heart attack and stroke Reviewed medications with patient and discussed importance of compliance Counseled on adverse effects of illicit drug and excessive alcohol use in patients with high blood pressure  Provided assistance with obtaining home blood pressure monitor via [facility] ; Discussed plans with patient for ongoing care management follow up and provided patient with direct contact information for care management team Advised patient, providing education and rationale, to monitor blood pressure daily and record, calling PCP for findings outside  established parameters Reviewed scheduled/upcoming provider appointments including:  Discussed complications of poorly controlled blood pressure such as heart disease, stroke, circulatory complications, vision complications, kidney impairment, sexual dysfunction Screening for signs and symptoms of depression related to chronic disease state  Update 4/28: Spoke with pt today and inquired on weekly BP monitoring. Pt states she is really just to monitor her blood pressures and will need to have a designated spot in her home to relax and take it correctly. Pt states , "the top number was okay but the bottom number with 88 on one day and another 93 however it was after she was moving". RN educated pt on the importance of weekly monitoring to prevent acute issues from occurring and discussed the plan of care and the appropriate way to take her blood pressure. Pt agreed she will start taking her BP weekly and documenting the readings now that she has the [facility] calendar and appropriate device for monitoring. Pt also aware of abnormal readings and what to do if acute readings or symptoms are encountered. Pt bp device has ranges and alerts when to seek medical attention or call her provider.  Update 09/11/2021: Spoke with pt today who denies any related symptoms of HTN. Pt states she has not monitored her blood pressure but will start with weekly monitoring and report more information on her readings next outreach. Continued education on the importance on monitoring her blood pressure and adherence to her prescribed medications. Discussed dietary habits related to low sodium and healthy eating habits. Encourage pt to document all readings in the [facility] calendar for continuous monitoring of her blood pressures. Will follow up over the next month on pt's adherence with her blood pressure monitoring.  Patient Goals/Self-Care Activities: Discussed self monitoring with blood pressures monitoring for twice weekly with b/p device  provided.  Follow Up Plan:  Telephone follow up appointment with care management team member scheduled for:  July 2023 The patient has been provided with contact information for the care management team and has been advised to call with any health related questions or concerns.       [name], RN Care Management Coordinator [place] Office [phone]

## 2021-09-13 DIAGNOSIS — H903 Sensorineural hearing loss, bilateral: Secondary | ICD-10-CM | POA: Diagnosis not present

## 2021-09-13 DIAGNOSIS — R42 Dizziness and giddiness: Secondary | ICD-10-CM | POA: Diagnosis not present

## 2021-10-09 ENCOUNTER — Other Ambulatory Visit: Payer: Self-pay | Admitting: *Deleted

## 2021-10-09 NOTE — Patient Outreach (Signed)
[facility]) Care Management Telephonic RN Care Manager Note   10/09/2021 Name:  Tabitha Moore MRN:  [id number] DOB:  08/24/1940  Summary: Lahoma Crocker call placed to member.  Denies any urgent concerns, encouraged to contact this care manager with questions.    Recommendations/Changes made from today's visit: Monitor blood pressure at least weekly and share readings with care manager.  Subjective: Tabitha Moore is an 81 y.o. year old female who is a primary patient of Shon Baton, MD. The care management team was consulted for assistance with care management and/or care coordination needs.    Telephonic RN Care Manager completed Telephone Visit today.  Objective:   Medications Reviewed Today     Reviewed by Tobi Bastos, RN (Registered Nurse) on 07/05/21 at 1238  Med List Status: <None>   Medication Order Taking? Sig Documenting Provider Last Dose Status Informant  acetaminophen (TYLENOL) 325 MG tablet [id number] Yes Take 650 mg by mouth every 6 (six) hours as needed for mild pain. [provider] Taking Active Self  acetaminophen (TYLENOL) 500 MG tablet [id number] Yes Take 1,000 mg by mouth every 6 (six) hours as needed for mild pain. [provider] Taking Active Self  amLODipine (NORVASC) 5 MG tablet [id number] Yes Take 5 mg by mouth daily. Take differently 2.5 mg (5 mg). [provider] Taking Active Self  azaTHIOprine (IMURAN) 50 MG tablet [id number] No Take by mouth.  Patient not taking: Reported on 07/05/2021   [provider] Not Taking Active   Bacillus Coagulans-Inulin (PROBIOTIC FORMULA PO) [id number] Yes Take 1 tablet by mouth daily.  [provider] Taking Active Self  baclofen (LIORESAL) 10 MG tablet [id number] No Take 1/2 to 1 by mouth every 8hrs as needed for spasm  Patient not taking: Reported on 07/05/2021   Magnus Sinning, MD Not Taking Active   BENICAR 40 MG tablet [id number] Yes Take 40 mg by mouth  daily.  [provider] Taking Active Self  Cholecalciferol (VITAMIN D PO) [id number] Yes Take 1,000 mg by mouth daily.  [provider] Taking Active Self  CRESTOR 5 MG tablet [id number] Yes Take 5 mg by mouth every morning.  [provider] Taking Active Self  dapagliflozin propanediol (FARXIGA) 5 MG TABS tablet [id number] Yes Take 5 mg by mouth every other day. [provider] Taking Active   doxycycline (VIBRAMYCIN) 100 MG capsule [id number] No Take 1 capsule (100 mg total) by mouth 2 (two) times daily.  Patient not taking: Reported on 07/05/2021   Hilts, Legrand Como, MD Not Taking Active   furosemide (LASIX) 20 MG tablet [id number]  Take 1 tablet (20 mg total) by mouth daily for 5 days. Roland Rack, PA-C  Expired 11/08/19 2359   gabapentin (NEURONTIN) 100 MG capsule [id number] Yes Take 100 mg by mouth at bedtime. Take different only as needed [provider] Taking Active   Ginger, Zingiber officinalis, (GINGER EXTRACT PO) [id number] Yes Take 1 tablet by mouth daily.  [provider] Taking Active Self  glimepiride (AMARYL) 4 MG tablet [id number] Yes Take 4 mg by mouth daily. [provider] Taking Active Self  levothyroxine (SYNTHROID, LEVOTHROID) 88 MCG tablet [id number] Yes Take 88 mcg by mouth daily.  [provider] Taking Active Self  meclizine (ANTIVERT) 12.5 MG tablet [id number] Yes Take 1 tablet (12.5 mg total) by mouth 3 (three) times daily as needed for dizziness. Hazel Sams, PA-C Taking Active   metFORMIN (GLUCOPHAGE) 500 MG tablet [id number] Yes Take 500 mg  by mouth every morning.  [provider] Taking Active Self  omega-3 acid ethyl esters (LOVAZA) 1 g capsule [id number] Yes Take 1 g by mouth daily. [provider] Taking Active Self  omeprazole (PRILOSEC) 40 MG capsule [id number] Yes Take 40 mg by mouth daily.  [provider] Taking Active Self  Donald Siva test strip [id number] Yes 2 (two) times  daily. [provider] Taking Active   Turmeric 400 MG CAPS [id number] Yes Take 1 capsule by mouth daily.  [provider] Taking Active Self             SDOH:  (Social Determinants of Health) assessments and interventions performed:     Care Plan  Review of patient past medical history, allergies, medications, health status, including review of consultants reports, laboratory and other test data, was performed as part of comprehensive evaluation for care management services.   Care Plan : RN Care Manager Plan of Care  Updates made by Valente David, RN since 10/09/2021 12:00 AM     Problem: Knowledge Deficit related to HTN and care coordination needs   Priority: High     Long-Range Goal: Development plan of care for management of HTN   Start Date: 07/05/2021  Expected End Date: 01/05/2022  This Visit's Progress: On track  Recent Progress: On track  Priority: High  Note:   Current Barriers:  Knowledge Deficits related to plan of care for management of HTN   RNCM Clinical Goal(s):  Patient will verbalize understanding of plan for management of HTN as evidenced by teach-back method of understanding and chart review verbalize basic understanding of  HTN disease process and self health management plan as evidenced by self reports. take all medications exactly as prescribed and will call provider for medication related questions as evidenced by self reporting an chart reviews  through collaboration with RN Care manager, provider, and care team.   Interventions: Inter-disciplinary care team collaboration (see longitudinal plan of care) Evaluation of current treatment plan related to  self management and patient's adherence to plan as established by provider   Hypertension Interventions:  (Status:  New goal. and Goal on track:  Yes.) Long Term Goal Last practice recorded BP readings:  BP Readings from Last 3 Encounters:  04/21/21 (!) 169/91  04/21/20 (!) 147/84   02/08/20 (!) 151/72  Most recent eGFR/CrCl: No results found for: EGFR  No components found for: CRCL  Take all medications as prescribed Attend all scheduled provider appointments Call pharmacy for medication refills 3-7 days in advance of running out of medications Attend church or other social activities Perform all self care activities independently  Perform IADL's (shopping, preparing meals, housekeeping, managing finances) independently Call provider office for new concerns or questions  check blood pressure weekly choose a place to take my blood pressure (home, clinic or office, retail store) write blood pressure results in a log or diary learn about high blood pressure keep a blood pressure log take blood pressure log to all doctor appointments call doctor for signs and symptoms of high blood pressure keep all doctor appointments take medications for blood pressure exactly as prescribed report new symptoms to your doctor eat more whole grains, fruits and vegetables, lean meats and healthy fats  Evaluation of current treatment plan related to hypertension self management and patient's adherence to plan as established by provider Provided education to patient re: stroke prevention, s/s of heart attack and stroke Reviewed medications with patient and discussed importance of compliance Counseled on  adverse effects of illicit drug and excessive alcohol use in patients with high blood pressure  Provided assistance with obtaining home blood pressure monitor via [facility] ; Discussed plans with patient for ongoing care management follow up and provided patient with direct contact information for care management team Advised patient, providing education and rationale, to monitor blood pressure daily and record, calling PCP for findings outside established parameters Reviewed scheduled/upcoming provider appointments including:  Discussed complications of poorly controlled blood pressure such  as heart disease, stroke, circulatory complications, vision complications, kidney impairment, sexual dysfunction Screening for signs and symptoms of depression related to chronic disease state  Update 4/28: Spoke with pt today and inquired on weekly BP monitoring. Pt states she is really just to monitor her blood pressures and will need to have a designated spot in her home to relax and take it correctly. Pt states , "the top number was okay but the bottom number with 88 on one day and another 93 however it was after she was moving". RN educated pt on the importance of weekly monitoring to prevent acute issues from occurring and discussed the plan of care and the appropriate way to take her blood pressure. Pt agreed she will start taking her BP weekly and documenting the readings now that she has the [facility] calendar and appropriate device for monitoring. Pt also aware of abnormal readings and what to do if acute readings or symptoms are encountered. Pt bp device has ranges and alerts when to seek medical attention or call her provider.  Update 09/11/2021: Spoke with pt today who denies any related symptoms of HTN. Pt states she has not monitored her blood pressure but will start with weekly monitoring and report more information on her readings next outreach. Continued education on the importance on monitoring her blood pressure and adherence to her prescribed medications. Discussed dietary habits related to low sodium and healthy eating habits. Encourage pt to document all readings in the [facility] calendar for continuous monitoring of her blood pressures. Will follow up over the next month on pt's adherence with her blood pressure monitoring.  Patient Goals/Self-Care Activities: Discussed self monitoring with blood pressures monitoring for twice weekly with b/p device provided.   Update 7/3 - Report she did not check blood pressure last week as she was out of town for a family reunion, but state she did check the  week before at [facility].  Does not remember the exact number, but state it had decreased and was "pretty good."  Acknowledges that she should monitor more often and will communicate with assigned care manager.   Follow Up Plan:  Telephone follow up appointment with care management team member scheduled for:  Within 3 months The patient has been provided with contact information for the care management team and has been advised to call with any health related questions or concerns.        Plan:  Telephone follow up appointment with care management team member scheduled for:  within 3 months The patient has been provided with contact information for the care management team and has been advised to call with any health related questions or concerns.   Valente David, RN, MSN, [place] Manager [phone]

## 2021-10-27 DIAGNOSIS — H26492 Other secondary cataract, left eye: Secondary | ICD-10-CM | POA: Diagnosis not present

## 2021-10-27 DIAGNOSIS — Z79899 Other long term (current) drug therapy: Secondary | ICD-10-CM | POA: Diagnosis not present

## 2021-10-27 DIAGNOSIS — Z961 Presence of intraocular lens: Secondary | ICD-10-CM | POA: Diagnosis not present

## 2021-10-27 DIAGNOSIS — H3581 Retinal edema: Secondary | ICD-10-CM | POA: Diagnosis not present

## 2021-10-27 DIAGNOSIS — Z794 Long term (current) use of insulin: Secondary | ICD-10-CM | POA: Diagnosis not present

## 2021-10-27 DIAGNOSIS — H30033 Focal chorioretinal inflammation, peripheral, bilateral: Secondary | ICD-10-CM | POA: Diagnosis not present

## 2021-10-27 DIAGNOSIS — E113313 Type 2 diabetes mellitus with moderate nonproliferative diabetic retinopathy with macular edema, bilateral: Secondary | ICD-10-CM | POA: Diagnosis not present

## 2021-10-27 DIAGNOSIS — Z7984 Long term (current) use of oral hypoglycemic drugs: Secondary | ICD-10-CM | POA: Diagnosis not present

## 2021-11-02 DIAGNOSIS — I129 Hypertensive chronic kidney disease with stage 1 through stage 4 chronic kidney disease, or unspecified chronic kidney disease: Secondary | ICD-10-CM | POA: Diagnosis not present

## 2021-11-02 DIAGNOSIS — E1122 Type 2 diabetes mellitus with diabetic chronic kidney disease: Secondary | ICD-10-CM | POA: Diagnosis not present

## 2021-11-02 DIAGNOSIS — N1831 Chronic kidney disease, stage 3a: Secondary | ICD-10-CM | POA: Diagnosis not present

## 2021-11-02 DIAGNOSIS — E1140 Type 2 diabetes mellitus with diabetic neuropathy, unspecified: Secondary | ICD-10-CM | POA: Diagnosis not present

## 2021-11-02 DIAGNOSIS — E785 Hyperlipidemia, unspecified: Secondary | ICD-10-CM | POA: Diagnosis not present

## 2021-11-02 DIAGNOSIS — D899 Disorder involving the immune mechanism, unspecified: Secondary | ICD-10-CM | POA: Diagnosis not present

## 2021-11-02 DIAGNOSIS — G629 Polyneuropathy, unspecified: Secondary | ICD-10-CM | POA: Diagnosis not present

## 2021-11-02 DIAGNOSIS — I77819 Aortic ectasia, unspecified site: Secondary | ICD-10-CM | POA: Diagnosis not present

## 2021-11-02 DIAGNOSIS — E039 Hypothyroidism, unspecified: Secondary | ICD-10-CM | POA: Diagnosis not present

## 2021-11-02 DIAGNOSIS — I351 Nonrheumatic aortic (valve) insufficiency: Secondary | ICD-10-CM | POA: Diagnosis not present

## 2021-11-02 DIAGNOSIS — E11319 Type 2 diabetes mellitus with unspecified diabetic retinopathy without macular edema: Secondary | ICD-10-CM | POA: Diagnosis not present

## 2021-12-08 ENCOUNTER — Ambulatory Visit: Payer: Self-pay

## 2021-12-08 NOTE — Patient Outreach (Signed)
  Care Coordination   12/08/2021 Name: Tabitha Moore MRN: [id number] DOB: 1940/05/12   Care Coordination Outreach Attempts:  An unsuccessful telephone outreach was attempted for a scheduled appointment today.  Follow Up Plan:  Additional outreach attempts will be made to offer the patient care coordination information and services.   Encounter Outcome:  No Answer  Care Coordination Interventions Activated:  No   Care Coordination Interventions:  No, not indicated    Barb Merino, RN, BSN, CCM Care Management Coordinator [facility] Care Management  Direct Phone: [phone]

## 2021-12-12 ENCOUNTER — Ambulatory Visit: Payer: Self-pay

## 2021-12-12 NOTE — Patient Outreach (Signed)
  Care Coordination   12/12/2021 Name: Tabitha Moore MRN: [id number] DOB: 12/20/1940   Care Coordination Outreach Attempts:  An unsuccessful telephone outreach was attempted for a scheduled appointment today.  Follow Up Plan:  Additional outreach attempts will be made to offer the patient care coordination information and services.   Encounter Outcome:  No Answer  Care Coordination Interventions Activated:  No   Care Coordination Interventions:  No, not indicated    Barb Merino, RN, BSN, CCM Care Management Coordinator [facility] Care Management Direct Phone: [phone]

## 2021-12-21 ENCOUNTER — Ambulatory Visit: Payer: Self-pay

## 2021-12-21 NOTE — Patient Outreach (Signed)
  Care Coordination   12/21/2021 Name: Tabitha Moore MRN: [id number] DOB: 12/31/1940   Care Coordination Outreach Attempts:  An unsuccessful telephone outreach was attempted for a scheduled appointment today.  Follow Up Plan:  No further outreach attempts will be made at this time. We have been unable to contact the patient to offer or enroll patient in care coordination services  Encounter Outcome:  No Answer  Care Coordination Interventions Activated:  No   Care Coordination Interventions:  No, not indicated    Barb Merino, RN, BSN, CCM Care Management Coordinator [place] Management  Direct Phone: [phone]

## 2022-02-15 DIAGNOSIS — E782 Mixed hyperlipidemia: Secondary | ICD-10-CM | POA: Diagnosis not present

## 2022-02-15 DIAGNOSIS — I10 Essential (primary) hypertension: Secondary | ICD-10-CM | POA: Diagnosis not present

## 2022-02-15 DIAGNOSIS — E669 Obesity, unspecified: Secondary | ICD-10-CM | POA: Diagnosis not present

## 2022-02-15 DIAGNOSIS — K219 Gastro-esophageal reflux disease without esophagitis: Secondary | ICD-10-CM | POA: Diagnosis not present

## 2022-02-15 DIAGNOSIS — E119 Type 2 diabetes mellitus without complications: Secondary | ICD-10-CM | POA: Diagnosis not present

## 2022-03-06 DIAGNOSIS — E785 Hyperlipidemia, unspecified: Secondary | ICD-10-CM | POA: Diagnosis not present

## 2022-03-06 DIAGNOSIS — Z23 Encounter for immunization: Secondary | ICD-10-CM | POA: Diagnosis not present

## 2022-03-06 DIAGNOSIS — N1831 Chronic kidney disease, stage 3a: Secondary | ICD-10-CM | POA: Diagnosis not present

## 2022-03-06 DIAGNOSIS — E039 Hypothyroidism, unspecified: Secondary | ICD-10-CM | POA: Diagnosis not present

## 2022-03-06 DIAGNOSIS — R252 Cramp and spasm: Secondary | ICD-10-CM | POA: Diagnosis not present

## 2022-03-06 DIAGNOSIS — G629 Polyneuropathy, unspecified: Secondary | ICD-10-CM | POA: Diagnosis not present

## 2022-03-06 DIAGNOSIS — M1990 Unspecified osteoarthritis, unspecified site: Secondary | ICD-10-CM | POA: Diagnosis not present

## 2022-03-06 DIAGNOSIS — E1122 Type 2 diabetes mellitus with diabetic chronic kidney disease: Secondary | ICD-10-CM | POA: Diagnosis not present

## 2022-03-06 DIAGNOSIS — I77819 Aortic ectasia, unspecified site: Secondary | ICD-10-CM | POA: Diagnosis not present

## 2022-03-06 DIAGNOSIS — I129 Hypertensive chronic kidney disease with stage 1 through stage 4 chronic kidney disease, or unspecified chronic kidney disease: Secondary | ICD-10-CM | POA: Diagnosis not present

## 2022-03-06 DIAGNOSIS — E1140 Type 2 diabetes mellitus with diabetic neuropathy, unspecified: Secondary | ICD-10-CM | POA: Diagnosis not present

## 2022-03-08 DIAGNOSIS — M79605 Pain in left leg: Secondary | ICD-10-CM | POA: Diagnosis not present

## 2022-03-08 DIAGNOSIS — M79604 Pain in right leg: Secondary | ICD-10-CM | POA: Diagnosis not present

## 2022-07-13 DIAGNOSIS — E039 Hypothyroidism, unspecified: Secondary | ICD-10-CM | POA: Diagnosis not present

## 2022-07-13 DIAGNOSIS — K219 Gastro-esophageal reflux disease without esophagitis: Secondary | ICD-10-CM | POA: Diagnosis not present

## 2022-07-13 DIAGNOSIS — R7989 Other specified abnormal findings of blood chemistry: Secondary | ICD-10-CM | POA: Diagnosis not present

## 2022-07-13 DIAGNOSIS — E1122 Type 2 diabetes mellitus with diabetic chronic kidney disease: Secondary | ICD-10-CM | POA: Diagnosis not present

## 2022-07-13 DIAGNOSIS — E785 Hyperlipidemia, unspecified: Secondary | ICD-10-CM | POA: Diagnosis not present

## 2022-07-20 DIAGNOSIS — E11319 Type 2 diabetes mellitus with unspecified diabetic retinopathy without macular edema: Secondary | ICD-10-CM | POA: Diagnosis not present

## 2022-07-20 DIAGNOSIS — Z23 Encounter for immunization: Secondary | ICD-10-CM | POA: Diagnosis not present

## 2022-07-20 DIAGNOSIS — R82998 Other abnormal findings in urine: Secondary | ICD-10-CM | POA: Diagnosis not present

## 2022-07-20 DIAGNOSIS — N1831 Chronic kidney disease, stage 3a: Secondary | ICD-10-CM | POA: Diagnosis not present

## 2022-07-20 DIAGNOSIS — I77819 Aortic ectasia, unspecified site: Secondary | ICD-10-CM | POA: Diagnosis not present

## 2022-07-20 DIAGNOSIS — N390 Urinary tract infection, site not specified: Secondary | ICD-10-CM | POA: Diagnosis not present

## 2022-07-20 DIAGNOSIS — I351 Nonrheumatic aortic (valve) insufficiency: Secondary | ICD-10-CM | POA: Diagnosis not present

## 2022-07-20 DIAGNOSIS — E039 Hypothyroidism, unspecified: Secondary | ICD-10-CM | POA: Diagnosis not present

## 2022-07-20 DIAGNOSIS — I429 Cardiomyopathy, unspecified: Secondary | ICD-10-CM | POA: Diagnosis not present

## 2022-07-20 DIAGNOSIS — E1122 Type 2 diabetes mellitus with diabetic chronic kidney disease: Secondary | ICD-10-CM | POA: Diagnosis not present

## 2022-07-20 DIAGNOSIS — I517 Cardiomegaly: Secondary | ICD-10-CM | POA: Diagnosis not present

## 2022-07-20 DIAGNOSIS — E871 Hypo-osmolality and hyponatremia: Secondary | ICD-10-CM | POA: Diagnosis not present

## 2022-07-20 DIAGNOSIS — Z1331 Encounter for screening for depression: Secondary | ICD-10-CM | POA: Diagnosis not present

## 2022-07-20 DIAGNOSIS — I129 Hypertensive chronic kidney disease with stage 1 through stage 4 chronic kidney disease, or unspecified chronic kidney disease: Secondary | ICD-10-CM | POA: Diagnosis not present

## 2022-07-20 DIAGNOSIS — Z0000 Encounter for general adult medical examination without abnormal findings: Secondary | ICD-10-CM | POA: Diagnosis not present

## 2022-07-26 ENCOUNTER — Other Ambulatory Visit (INDEPENDENT_AMBULATORY_CARE_PROVIDER_SITE_OTHER): Payer: Medicare Other

## 2022-07-26 ENCOUNTER — Telehealth: Payer: Self-pay

## 2022-07-26 ENCOUNTER — Ambulatory Visit (INDEPENDENT_AMBULATORY_CARE_PROVIDER_SITE_OTHER): Payer: Medicare Other | Admitting: Orthopaedic Surgery

## 2022-07-26 DIAGNOSIS — M1712 Unilateral primary osteoarthritis, left knee: Secondary | ICD-10-CM | POA: Diagnosis not present

## 2022-07-26 DIAGNOSIS — M1711 Unilateral primary osteoarthritis, right knee: Secondary | ICD-10-CM | POA: Diagnosis not present

## 2022-07-26 MED ORDER — BUPIVACAINE HCL 0.5 % IJ SOLN
2.0000 mL | INTRAMUSCULAR | Status: AC | PRN
  Administered 2022-07-26: 2 mL via INTRA_ARTICULAR

## 2022-07-26 MED ORDER — LIDOCAINE HCL 1 % IJ SOLN
2.0000 mL | INTRAMUSCULAR | Status: AC | PRN
  Administered 2022-07-26: 2 mL

## 2022-07-26 MED ORDER — METHYLPREDNISOLONE ACETATE 40 MG/ML IJ SUSP
40.0000 mg | INTRAMUSCULAR | Status: AC | PRN
  Administered 2022-07-26: 40 mg via INTRA_ARTICULAR

## 2022-07-26 NOTE — Telephone Encounter (Signed)
Please precert for bilateral visco injections. Dr.Xu's patient.  

## 2022-07-26 NOTE — Telephone Encounter (Signed)
VOB submitted for Orthovisc, bilateral knee 

## 2022-07-26 NOTE — Progress Notes (Addendum)
Office Visit Note   Patient: Tabitha Moore           Date of Birth: 01/14/1941           MRN: [id number] Visit Date: 07/26/2022              Requested by: Creola Corn, MD [address] [place],  [place] [address] PCP: Creola Corn, MD   Assessment & Plan: Visit Diagnoses:  1. Primary osteoarthritis of left knee   2. Primary osteoarthritis of right knee     Plan: Impression is 82 year old female with advanced bilateral knee DJD worse in the patellofemoral compartments.  Bilateral steroid injections performed today.  Patient tolerated well.  We will get authorization for Visco injections.  This patient is diagnosed with osteoarthritis of the knee(s).    Radiographs show evidence of joint space narrowing, osteophytes, subchondral sclerosis and/or subchondral cysts.  This patient has knee pain which interferes with functional and activities of daily living.    This patient has experienced inadequate response, adverse effects and/or intolerance with conservative treatments such as acetaminophen, NSAIDS, topical creams, physical therapy or regular exercise, knee bracing and/or weight loss.   This patient has experienced inadequate response or has a contraindication to intra articular steroid injections for at least 3 months.   This patient is not scheduled to have a total knee replacement within 6 months of starting treatment with viscosupplementation.  Follow-Up Instructions: No follow-ups on file.   Orders:  Orders Placed This Encounter  Procedures   Large Joint Inj: bilateral knee   XR Knee 1-2 Views Left   XR Knee 1-2 Views Right   Meds ordered this encounter  Medications   bupivacaine (MARCAINE) 0.5 % (with pres) injection 2 mL   bupivacaine (MARCAINE) 0.5 % (with pres) injection 2 mL   lidocaine (XYLOCAINE) 1 % (with pres) injection 2 mL   lidocaine (XYLOCAINE) 1 % (with pres) injection 2 mL   methylPREDNISolone acetate (DEPO-MEDROL) injection 40 mg    methylPREDNISolone acetate (DEPO-MEDROL) injection 40 mg    Procedures: Large Joint Inj: bilateral knee on 07/26/2022 9:21 AM Indications: pain Details: 22 G needle  Arthrogram: No  Medications (Right): 2 mL lidocaine 1 %; 2 mL bupivacaine 0.5 %; 40 mg methylPREDNISolone acetate 40 MG/ML Medications (Left): 2 mL lidocaine 1 %; 2 mL bupivacaine 0.5 %; 40 mg methylPREDNISolone acetate 40 MG/ML Outcome: tolerated well, no immediate complications Patient was prepped and draped in the usual sterile fashion.       Clinical Data: No additional findings.   Subjective: Chief Complaint  Patient presents with   Right Knee - Pain   Left Knee - Pain    HPI  Tabitha Moore is a 82 year old female here for evaluation of bilateral knee DJD.  I have been seeing her for a few years.  We have been managing her pain with steroid injections.  She has questions about Visco.  So far the steroid injections are still effective.  Review of Systems  Constitutional: Negative.   HENT: Negative.    Eyes: Negative.   Respiratory: Negative.    Cardiovascular: Negative.   Endocrine: Negative.   Musculoskeletal: Negative.   Neurological: Negative.   Hematological: Negative.   Psychiatric/Behavioral: Negative.    All other systems reviewed and are negative.    Objective: Vital Signs: There were no vitals taken for this visit.  Physical Exam Vitals and nursing note reviewed.  Constitutional:      Appearance: She is well-developed.  HENT:  Head: Normocephalic and atraumatic.  Pulmonary:     Effort: Pulmonary effort is normal.  Abdominal:     Palpations: Abdomen is soft.  Musculoskeletal:     Cervical back: Neck supple.  Skin:    General: Skin is warm.     Capillary Refill: Capillary refill takes less than 2 seconds.  Neurological:     Mental Status: She is alert and oriented to person, place, and time.  Psychiatric:        Behavior: Behavior normal.        Thought Content: Thought  content normal.        Judgment: Judgment normal.     Ortho Exam  Examination of bilateral knees showed no joint effusion.  2+ patellofemoral crepitus with range of motion.  Collaterals and cruciates are stable.  Specialty Comments:  No specialty comments available.  Imaging: XR Knee 1-2 Views Right  Result Date: 07/26/2022 Advanced tricompartment DJD worse in the patellofemoral compartment.  XR Knee 1-2 Views Left  Result Date: 07/26/2022 Advanced tricompartmental DJD worse in the patellofemoral compartment.    PMFS History: Patient Active Problem List   Diagnosis Date Noted   Abnormal stress test 01/07/2019   Nonrheumatic aortic valve insufficiency 01/07/2019   Exertional dyspnea 11/04/2018   Dizziness 11/04/2018   Hallux rigidus, bilateral 05/05/2018   High risk medication use 11/17/2016   DDD (degenerative disc disease), lumbar 11/17/2016   Primary osteoarthritis of both knees 11/13/2016   Pain in left ankle and joints of left foot 11/13/2016   Essential hypertension 10/18/2016   Left ventricular hypertrophy 10/18/2016   PVC's (premature ventricular contractions) 10/18/2016   History of diabetes mellitus 10/18/2016   History of hypothyroidism 10/18/2016   Dyslipidemia 10/18/2016   Obesity (BMI 35.0-39.9 without comorbidity) 10/18/2016   History of GI diverticular bleed 10/18/2016   Stage 4 chronic kidney disease 10/18/2016   Chorioretinitis of both eyes 10/18/2016   Internal and external prolapsed hemorrhoids, bleeding. 10/05/2011   Past Medical History:  Diagnosis Date   Arthritis    Complication of anesthesia    pt states "hard to wake up"   Diabetes mellitus    Hemorrhoids    Hyperlipidemia    Hypertension    Hypothyroidism    Retinal cyst     Family History  Problem Relation Age of Onset   Cancer Father        lung   Cancer Sister        breast    Past Surgical History:  Procedure Laterality Date   ABDOMINAL HYSTERECTOMY     BREAST  LUMPECTOMY WITH RADIOACTIVE SEED LOCALIZATION Left 08/27/2014   Procedure: BREAST LUMPECTOMY WITH RADIOACTIVE SEED LOCALIZATION;  Surgeon: Chevis Pretty III, MD;  Location: [facility];  Service: General;  Laterality: Left;   CATARACT EXTRACTION     CHOLECYSTECTOMY     Social History   Occupational History   Not on file  Tobacco Use   Smoking status: Never   Smokeless tobacco: Never  Vaping Use   Vaping Use: Never used  Substance and Sexual Activity   Alcohol use: No   Drug use: Never   Sexual activity: Not on file

## 2022-08-20 DIAGNOSIS — Z780 Asymptomatic menopausal state: Secondary | ICD-10-CM | POA: Diagnosis not present

## 2022-08-30 DIAGNOSIS — M8589 Other specified disorders of bone density and structure, multiple sites: Secondary | ICD-10-CM | POA: Diagnosis not present

## 2022-09-12 DIAGNOSIS — H838X3 Other specified diseases of inner ear, bilateral: Secondary | ICD-10-CM | POA: Diagnosis not present

## 2022-09-12 DIAGNOSIS — H903 Sensorineural hearing loss, bilateral: Secondary | ICD-10-CM | POA: Diagnosis not present

## 2022-09-12 DIAGNOSIS — H6123 Impacted cerumen, bilateral: Secondary | ICD-10-CM | POA: Diagnosis not present

## 2022-09-12 DIAGNOSIS — R42 Dizziness and giddiness: Secondary | ICD-10-CM | POA: Diagnosis not present

## 2022-09-18 ENCOUNTER — Telehealth: Payer: Self-pay

## 2022-09-18 ENCOUNTER — Other Ambulatory Visit: Payer: Self-pay

## 2022-09-18 DIAGNOSIS — G8929 Other chronic pain: Secondary | ICD-10-CM

## 2022-09-18 NOTE — Telephone Encounter (Signed)
Called and left a VM for pateint to CB to schedule for gel injection with Dr. Roda Shutters.  See referrals tab

## 2022-09-19 ENCOUNTER — Telehealth: Payer: Self-pay

## 2022-09-19 NOTE — Telephone Encounter (Signed)
Patient returned my call concerning gel injections.  Stated that she would like to think about it first, then she will give the office a call back.

## 2022-10-03 ENCOUNTER — Ambulatory Visit (INDEPENDENT_AMBULATORY_CARE_PROVIDER_SITE_OTHER): Payer: Medicare Other | Admitting: Orthopaedic Surgery

## 2022-10-03 ENCOUNTER — Encounter: Payer: Self-pay | Admitting: Orthopaedic Surgery

## 2022-10-03 DIAGNOSIS — M170 Bilateral primary osteoarthritis of knee: Secondary | ICD-10-CM

## 2022-10-03 MED ORDER — HYALURONAN 30 MG/2ML IX SOSY
30.0000 mg | PREFILLED_SYRINGE | INTRA_ARTICULAR | Status: AC | PRN
  Administered 2022-10-03: 30 mg via INTRA_ARTICULAR

## 2022-10-03 NOTE — Progress Notes (Signed)
Office Visit Note   Patient: Tabitha Moore           Date of Birth: 06/06/1940           MRN: [id number] Visit Date: 10/03/2022              Requested by: Creola Corn, MD [address] [place],  [place] [address] PCP: Creola Corn, MD   Assessment & Plan: Visit Diagnoses:  1. Bilateral primary osteoarthritis of knee     Plan: Impression is bilateral knee osteoarthritis.  Today, proceed with bilateral knee Orthovisc No. 1 injections.  She tolerated these well.  She will follow-up with [place] next week for Orthovisc No. 2 injection to both knees.  Call with concerns or questions. Expiration date 08/07/2024 Lot #[id number]  Follow-Up Instructions: Return in about 1 week (around 10/10/2022).   Orders:  No orders of the defined types were placed in this encounter.  No orders of the defined types were placed in this encounter.     Procedures: Large Joint Inj: bilateral knee on 10/03/2022 8:09 PM Indications: pain Details: 22 G needle  Arthrogram: No  Medications (Right): 30 mg Hyaluronan 30 MG/2ML Medications (Left): 30 mg Hyaluronan 30 MG/2ML Outcome: tolerated well, no immediate complications Patient was prepped and draped in the usual sterile fashion.       Clinical Data: No additional findings.   Subjective: Chief Complaint  Patient presents with   Right Knee - Follow-up    Orthovisc #1   Left Knee - Follow-up    Orthovisc #1    HPI patient is a pleasant 82 year old female with bilateral knee osteoarthritis who comes in today for Orthovisc No. 1 injection to both knees.  No previous viscosupplementation injection to either knee.     Objective: Vital Signs: There were no vitals taken for this visit.    Ortho Exam stable bilateral knee exam  Specialty Comments:  No specialty comments available.  Imaging: No new imaging   PMFS History: Patient Active Problem List   Diagnosis Date Noted   Abnormal stress test 01/07/2019   Nonrheumatic aortic valve  insufficiency 01/07/2019   Exertional dyspnea 11/04/2018   Dizziness 11/04/2018   Hallux rigidus, bilateral 05/05/2018   High risk medication use 11/17/2016   DDD (degenerative disc disease), lumbar 11/17/2016   Primary osteoarthritis of both knees 11/13/2016   Pain in left ankle and joints of left foot 11/13/2016   Essential hypertension 10/18/2016   Left ventricular hypertrophy 10/18/2016   PVC's (premature ventricular contractions) 10/18/2016   History of diabetes mellitus 10/18/2016   History of hypothyroidism 10/18/2016   Dyslipidemia 10/18/2016   Obesity (BMI 35.0-39.9 without comorbidity) 10/18/2016   History of GI diverticular bleed 10/18/2016   Stage 4 chronic kidney disease (HCC) 10/18/2016   Chorioretinitis of both eyes 10/18/2016   Internal and external prolapsed hemorrhoids, bleeding. 10/05/2011   Past Medical History:  Diagnosis Date   Arthritis    Complication of anesthesia    pt states "hard to wake up"   Diabetes mellitus    Hemorrhoids    Hyperlipidemia    Hypertension    Hypothyroidism    Retinal cyst     Family History  Problem Relation Age of Onset   Cancer Father        lung   Cancer Sister        breast    Past Surgical History:  Procedure Laterality Date   ABDOMINAL HYSTERECTOMY     BREAST LUMPECTOMY WITH RADIOACTIVE  SEED LOCALIZATION Left 08/27/2014   Procedure: BREAST LUMPECTOMY WITH RADIOACTIVE SEED LOCALIZATION;  Surgeon: Chevis Pretty III, MD;  Location: [facility];  Service: General;  Laterality: Left;   CATARACT EXTRACTION     CHOLECYSTECTOMY     Social History   Occupational History   Not on file  Tobacco Use   Smoking status: Never   Smokeless tobacco: Never  Vaping Use   Vaping Use: Never used  Substance and Sexual Activity   Alcohol use: No   Drug use: Never   Sexual activity: Not on file

## 2022-10-10 ENCOUNTER — Ambulatory Visit (INDEPENDENT_AMBULATORY_CARE_PROVIDER_SITE_OTHER): Payer: Medicare Other | Admitting: Orthopaedic Surgery

## 2022-10-10 ENCOUNTER — Encounter: Payer: Self-pay | Admitting: Orthopaedic Surgery

## 2022-10-10 DIAGNOSIS — M170 Bilateral primary osteoarthritis of knee: Secondary | ICD-10-CM | POA: Diagnosis not present

## 2022-10-10 MED ORDER — HYALURONAN 30 MG/2ML IX SOSY
30.0000 mg | PREFILLED_SYRINGE | INTRA_ARTICULAR | Status: AC | PRN
  Administered 2022-10-10: 30 mg via INTRA_ARTICULAR

## 2022-10-10 NOTE — Progress Notes (Signed)
Office Visit Note   Patient: Tabitha Moore           Date of Birth: [birth year]           MRN: [id number] Visit Date: 10/10/2022              Requested by: Creola Corn, MD [address] [place],  [place] [address] PCP: Creola Corn, MD   Assessment & Plan: Visit Diagnoses:  1. Bilateral primary osteoarthritis of knee     Plan: Impression is bilateral knee osteoarthritis.  Today, proceed with bilateral knee Orthovisc No. 2 injections.  She tolerated this well.  She will follow-up with [place] next week for her third injections.  Call with concerns or questions. Expiration date 06/19/2024 Lot #[id number]  Follow-Up Instructions: Return in about 1 week (around 10/17/2022).   Orders:  No orders of the defined types were placed in this encounter.  No orders of the defined types were placed in this encounter.     Procedures: Large Joint Inj: bilateral knee on 10/10/2022 9:29 PM Details: 22 G needle Medications (Right): 30 mg Hyaluronan 30 MG/2ML Medications (Left): 30 mg Hyaluronan 30 MG/2ML Outcome: tolerated well, no immediate complications Patient was prepped and draped in the usual sterile fashion.       Clinical Data: No additional findings.   Subjective: Chief Complaint  Patient presents with   Right Knee - Follow-up    Orthovisc #2   Left Knee - Follow-up    Orthovisc #2    HPI patient is a pleasant 82 year old female who comes in today for Orthovisc No. 2 injections to both knees.  She experienced some pain relief in the right knee following the injection last week.    Objective: Vital Signs: There were no vitals taken for this visit.    Ortho Exam stable bilateral knee exam  Specialty Comments:  No specialty comments available.  Imaging: No new imaging   PMFS History: Patient Active Problem List   Diagnosis Date Noted   Abnormal stress test 01/07/2019   Nonrheumatic aortic valve insufficiency 01/07/2019   Exertional dyspnea 11/04/2018    Dizziness 11/04/2018   Hallux rigidus, bilateral 05/05/2018   High risk medication use 11/17/2016   DDD (degenerative disc disease), lumbar 11/17/2016   Primary osteoarthritis of both knees 11/13/2016   Pain in left ankle and joints of left foot 11/13/2016   Essential hypertension 10/18/2016   Left ventricular hypertrophy 10/18/2016   PVC's (premature ventricular contractions) 10/18/2016   History of diabetes mellitus 10/18/2016   History of hypothyroidism 10/18/2016   Dyslipidemia 10/18/2016   Obesity (BMI 35.0-39.9 without comorbidity) 10/18/2016   History of GI diverticular bleed 10/18/2016   Stage 4 chronic kidney disease (HCC) 10/18/2016   Chorioretinitis of both eyes 10/18/2016   Internal and external prolapsed hemorrhoids, bleeding. 10/05/2011   Past Medical History:  Diagnosis Date   Arthritis    Complication of anesthesia    pt states "hard to wake up"   Diabetes mellitus    Hemorrhoids    Hyperlipidemia    Hypertension    Hypothyroidism    Retinal cyst     Family History  Problem Relation Age of Onset   Cancer Father        lung   Cancer Sister        breast    Past Surgical History:  Procedure Laterality Date   ABDOMINAL HYSTERECTOMY     BREAST LUMPECTOMY WITH RADIOACTIVE SEED LOCALIZATION Left 08/27/2014   Procedure: BREAST  LUMPECTOMY WITH RADIOACTIVE SEED LOCALIZATION;  Surgeon: Chevis Pretty III, MD;  Location: [facility];  Service: General;  Laterality: Left;   CATARACT EXTRACTION     CHOLECYSTECTOMY     Social History   Occupational History   Not on file  Tobacco Use   Smoking status: Never   Smokeless tobacco: Never  Vaping Use   Vaping Use: Never used  Substance and Sexual Activity   Alcohol use: No   Drug use: Never   Sexual activity: Not on file

## 2022-10-17 ENCOUNTER — Ambulatory Visit (INDEPENDENT_AMBULATORY_CARE_PROVIDER_SITE_OTHER): Payer: Medicare Other | Admitting: Orthopaedic Surgery

## 2022-10-17 ENCOUNTER — Encounter: Payer: Self-pay | Admitting: Orthopaedic Surgery

## 2022-10-17 DIAGNOSIS — M170 Bilateral primary osteoarthritis of knee: Secondary | ICD-10-CM | POA: Diagnosis not present

## 2022-10-17 MED ORDER — HYALURONAN 30 MG/2ML IX SOSY
30.0000 mg | PREFILLED_SYRINGE | INTRA_ARTICULAR | Status: AC | PRN
  Administered 2022-10-17: 30 mg via INTRA_ARTICULAR

## 2022-10-17 NOTE — Progress Notes (Signed)
Office Visit Note   Patient: Tabitha Moore           Date of Birth: [birth year]           MRN: [id number] Visit Date: 10/17/2022              Requested by: Creola Corn, MD [address] [place],  [place] [address] PCP: Creola Corn, MD   Assessment & Plan: Visit Diagnoses:  1. Bilateral primary osteoarthritis of knee     Plan: Impression is bilateral knee OA.  Today, we proceeded with the third set of Orthovisc injections to both knees.  She tolerated these well.  She will follow-up with [place] as needed. Expiration date 11/06/2024 Lot #[id number]  Follow-Up Instructions: Return if symptoms worsen or fail to improve.   Orders:  No orders of the defined types were placed in this encounter.  No orders of the defined types were placed in this encounter.     Procedures: Large Joint Inj: bilateral knee on 10/17/2022 10:24 PM Indications: pain Details: 22 G needle  Arthrogram: No  Medications (Right): 30 mg Hyaluronan 30 MG/2ML Medications (Left): 30 mg Hyaluronan 30 MG/2ML Outcome: tolerated well, no immediate complications Patient was prepped and draped in the usual sterile fashion.       Clinical Data: No additional findings.   Subjective: Chief Complaint  Patient presents with   Right Knee - Follow-up    Orthovisc #3   Left Knee - Follow-up    Orthovisc #3    HPI patient is a pleasant 82 year old female who comes in today with bilateral knee OA.  She is here for Orthovisc No. 3 injections to both knees.     Objective: Vital Signs: There were no vitals taken for this visit.    Ortho Exam unchanged bilateral knee exam  Specialty Comments:  No specialty comments available.  Imaging: No new imaging   PMFS History: Patient Active Problem List   Diagnosis Date Noted   Abnormal stress test 01/07/2019   Nonrheumatic aortic valve insufficiency 01/07/2019   Exertional dyspnea 11/04/2018   Dizziness 11/04/2018   Hallux rigidus, bilateral 05/05/2018    High risk medication use 11/17/2016   DDD (degenerative disc disease), lumbar 11/17/2016   Primary osteoarthritis of both knees 11/13/2016   Pain in left ankle and joints of left foot 11/13/2016   Essential hypertension 10/18/2016   Left ventricular hypertrophy 10/18/2016   PVC's (premature ventricular contractions) 10/18/2016   History of diabetes mellitus 10/18/2016   History of hypothyroidism 10/18/2016   Dyslipidemia 10/18/2016   Obesity (BMI 35.0-39.9 without comorbidity) 10/18/2016   History of GI diverticular bleed 10/18/2016   Stage 4 chronic kidney disease (HCC) 10/18/2016   Chorioretinitis of both eyes 10/18/2016   Internal and external prolapsed hemorrhoids, bleeding. 10/05/2011   Past Medical History:  Diagnosis Date   Arthritis    Complication of anesthesia    pt states "hard to wake up"   Diabetes mellitus    Hemorrhoids    Hyperlipidemia    Hypertension    Hypothyroidism    Retinal cyst     Family History  Problem Relation Age of Onset   Cancer Father        lung   Cancer Sister        breast    Past Surgical History:  Procedure Laterality Date   ABDOMINAL HYSTERECTOMY     BREAST LUMPECTOMY WITH RADIOACTIVE SEED LOCALIZATION Left 08/27/2014   Procedure: BREAST LUMPECTOMY WITH RADIOACTIVE SEED LOCALIZATION;  Surgeon: Chevis Pretty III, MD;  Location: [facility];  Service: General;  Laterality: Left;   CATARACT EXTRACTION     CHOLECYSTECTOMY     Social History   Occupational History   Not on file  Tobacco Use   Smoking status: Never   Smokeless tobacco: Never  Vaping Use   Vaping Use: Never used  Substance and Sexual Activity   Alcohol use: No   Drug use: Never   Sexual activity: Not on file

## 2022-11-12 DIAGNOSIS — I429 Cardiomyopathy, unspecified: Secondary | ICD-10-CM | POA: Diagnosis not present

## 2022-11-12 DIAGNOSIS — N1831 Chronic kidney disease, stage 3a: Secondary | ICD-10-CM | POA: Diagnosis not present

## 2022-11-12 DIAGNOSIS — E1122 Type 2 diabetes mellitus with diabetic chronic kidney disease: Secondary | ICD-10-CM | POA: Diagnosis not present

## 2022-11-12 DIAGNOSIS — E039 Hypothyroidism, unspecified: Secondary | ICD-10-CM | POA: Diagnosis not present

## 2022-11-12 DIAGNOSIS — G629 Polyneuropathy, unspecified: Secondary | ICD-10-CM | POA: Diagnosis not present

## 2022-11-12 DIAGNOSIS — I517 Cardiomegaly: Secondary | ICD-10-CM | POA: Diagnosis not present

## 2022-11-12 DIAGNOSIS — I77819 Aortic ectasia, unspecified site: Secondary | ICD-10-CM | POA: Diagnosis not present

## 2022-11-12 DIAGNOSIS — E1140 Type 2 diabetes mellitus with diabetic neuropathy, unspecified: Secondary | ICD-10-CM | POA: Diagnosis not present

## 2022-11-12 DIAGNOSIS — E785 Hyperlipidemia, unspecified: Secondary | ICD-10-CM | POA: Diagnosis not present

## 2022-11-12 DIAGNOSIS — I129 Hypertensive chronic kidney disease with stage 1 through stage 4 chronic kidney disease, or unspecified chronic kidney disease: Secondary | ICD-10-CM | POA: Diagnosis not present

## 2022-11-12 DIAGNOSIS — I493 Ventricular premature depolarization: Secondary | ICD-10-CM | POA: Diagnosis not present

## 2022-11-27 ENCOUNTER — Ambulatory Visit (INDEPENDENT_AMBULATORY_CARE_PROVIDER_SITE_OTHER): Payer: Medicare Other | Admitting: Podiatry

## 2022-11-27 DIAGNOSIS — E1142 Type 2 diabetes mellitus with diabetic polyneuropathy: Secondary | ICD-10-CM

## 2022-11-27 DIAGNOSIS — M21612 Bunion of left foot: Secondary | ICD-10-CM

## 2022-11-27 DIAGNOSIS — M2012 Hallux valgus (acquired), left foot: Secondary | ICD-10-CM | POA: Diagnosis not present

## 2022-11-27 DIAGNOSIS — M2011 Hallux valgus (acquired), right foot: Secondary | ICD-10-CM

## 2022-11-27 DIAGNOSIS — M21611 Bunion of right foot: Secondary | ICD-10-CM

## 2022-11-27 DIAGNOSIS — E119 Type 2 diabetes mellitus without complications: Secondary | ICD-10-CM | POA: Diagnosis not present

## 2022-11-27 NOTE — Progress Notes (Signed)
  Subjective:  Patient ID: ICESS Moore, female    DOB: [birth year],  MRN: [id number]  Chief Complaint  Patient presents with   Foot Orthotics    Patient is interested in diabetic shoes.    Toe Pain    C/o bilateral hallux tightness in the morning time. She is interested in toe spacers.     82 y.o. female presents with the above complaint. History confirmed with patient.  She has bunions that makes the toe drift over it feels about there is a tight band around it and causes pressure, it is difficult to find shoes that are comfortable  Objective:  Physical Exam: warm, good capillary refill, no trophic changes or ulcerative lesions, normal DP and PT pulses, and abnormal sensory exam she does have some scattered appreciation of a monofilament in the toes, hallux valgus deformity bilaterally  Assessment:   1. Hallux valgus with bunions, right   2. Hallux valgus with bunions, left   3. Type 2 diabetes mellitus with diabetic polyneuropathy, without long-term current use of insulin (HCC)      Plan:  Patient was evaluated and treated and all questions answered.   Patient educated on diabetes. Discussed proper diabetic foot care and discussed risks and complications of disease. Educated patient in depth on reasons to return to the office immediately should he/she discover anything concerning or new on the feet. All questions answered. Discussed proper shoes as well.   She does have some polyneuropathy as well as bony deformity such as hallux valgus, I do think she would be a good candidate for extra-depth diabetic shoes with multidensity insoles.  She will be scheduled to be fitted for these.  Follow-up with me as needed  Return if symptoms worsen or fail to improve.

## 2022-12-17 ENCOUNTER — Other Ambulatory Visit: Payer: Medicare Other

## 2022-12-31 ENCOUNTER — Ambulatory Visit: Payer: Medicare Other

## 2022-12-31 DIAGNOSIS — E1142 Type 2 diabetes mellitus with diabetic polyneuropathy: Secondary | ICD-10-CM

## 2023-01-09 NOTE — Progress Notes (Signed)
Patient presents to the office today for diabetic shoe and insole measuring.  Patient was measured with brannock device to determine size and width for 1 pair of extra depth shoes and foam casted for 3 pair of insoles.   Documentation of medical necessity will be sent to patient's treating diabetic doctor to verify and sign.   Patient's diabetic provider: Creola Corn MD  Shoes and insoles will be ordered at that time and patient will be notified for an appointment for fitting when they arrive.   Shoe size (per patient): 9 Brannock measurement: 9.5 Patient shoe selection- Shoe choice:   [id number] / [id number]  Shoe size ordered: 9.5 MED  Addison Bailey Cped, CFo, CFm

## 2023-02-14 ENCOUNTER — Telehealth: Payer: Self-pay

## 2023-02-14 NOTE — Telephone Encounter (Signed)
pt called in to give new fax number for Dr. Timothy Lasso concering her diabetic shoe paperwork. new fax number [phone]

## 2023-02-19 NOTE — Telephone Encounter (Addendum)
I tried several times to send to Dr. Timothy Lasso office with updated fax number and will not go through stating communication error I also called and verified Fax number with Dr. Timothy Lasso office  I tried to call patient and VM box is full. I am mailing docs to Doctors office so they can have signed and fax to [facility]  Addison Bailey CPed, CFo, CFm

## 2023-03-12 NOTE — Telephone Encounter (Signed)
Shoes and inserts are in will be calling patient for fitting appt. Addison Bailey CPed, CFo, CFm

## 2023-04-04 ENCOUNTER — Ambulatory Visit: Payer: Medicare Other

## 2023-04-04 DIAGNOSIS — M2011 Hallux valgus (acquired), right foot: Secondary | ICD-10-CM | POA: Diagnosis not present

## 2023-04-04 DIAGNOSIS — M2012 Hallux valgus (acquired), left foot: Secondary | ICD-10-CM | POA: Diagnosis not present

## 2023-04-04 DIAGNOSIS — E1142 Type 2 diabetes mellitus with diabetic polyneuropathy: Secondary | ICD-10-CM

## 2023-04-04 DIAGNOSIS — M21612 Bunion of left foot: Secondary | ICD-10-CM

## 2023-04-04 DIAGNOSIS — M21611 Bunion of right foot: Secondary | ICD-10-CM

## 2023-04-04 DIAGNOSIS — M2142 Flat foot [pes planus] (acquired), left foot: Secondary | ICD-10-CM

## 2023-04-04 DIAGNOSIS — M2141 Flat foot [pes planus] (acquired), right foot: Secondary | ICD-10-CM

## 2023-04-04 NOTE — Progress Notes (Signed)

## 2023-06-06 DIAGNOSIS — Z1211 Encounter for screening for malignant neoplasm of colon: Secondary | ICD-10-CM | POA: Diagnosis not present

## 2023-06-06 DIAGNOSIS — Z1212 Encounter for screening for malignant neoplasm of rectum: Secondary | ICD-10-CM | POA: Diagnosis not present

## 2023-06-14 ENCOUNTER — Other Ambulatory Visit (HOSPITAL_COMMUNITY): Payer: Self-pay

## 2023-07-16 DIAGNOSIS — E785 Hyperlipidemia, unspecified: Secondary | ICD-10-CM | POA: Diagnosis not present

## 2023-07-16 DIAGNOSIS — N1831 Chronic kidney disease, stage 3a: Secondary | ICD-10-CM | POA: Diagnosis not present

## 2023-07-16 DIAGNOSIS — E039 Hypothyroidism, unspecified: Secondary | ICD-10-CM | POA: Diagnosis not present

## 2023-07-16 DIAGNOSIS — E871 Hypo-osmolality and hyponatremia: Secondary | ICD-10-CM | POA: Diagnosis not present

## 2023-07-16 DIAGNOSIS — E1122 Type 2 diabetes mellitus with diabetic chronic kidney disease: Secondary | ICD-10-CM | POA: Diagnosis not present

## 2023-07-16 DIAGNOSIS — I1310 Hypertensive heart and chronic kidney disease without heart failure, with stage 1 through stage 4 chronic kidney disease, or unspecified chronic kidney disease: Secondary | ICD-10-CM | POA: Diagnosis not present

## 2023-07-30 DIAGNOSIS — M542 Cervicalgia: Secondary | ICD-10-CM | POA: Diagnosis not present

## 2023-07-30 DIAGNOSIS — Z0000 Encounter for general adult medical examination without abnormal findings: Secondary | ICD-10-CM | POA: Diagnosis not present

## 2023-07-30 DIAGNOSIS — E1122 Type 2 diabetes mellitus with diabetic chronic kidney disease: Secondary | ICD-10-CM | POA: Diagnosis not present

## 2023-07-30 DIAGNOSIS — E11319 Type 2 diabetes mellitus with unspecified diabetic retinopathy without macular edema: Secondary | ICD-10-CM | POA: Diagnosis not present

## 2023-07-30 DIAGNOSIS — I351 Nonrheumatic aortic (valve) insufficiency: Secondary | ICD-10-CM | POA: Diagnosis not present

## 2023-07-30 DIAGNOSIS — G629 Polyneuropathy, unspecified: Secondary | ICD-10-CM | POA: Diagnosis not present

## 2023-07-30 DIAGNOSIS — I77819 Aortic ectasia, unspecified site: Secondary | ICD-10-CM | POA: Diagnosis not present

## 2023-07-30 DIAGNOSIS — E039 Hypothyroidism, unspecified: Secondary | ICD-10-CM | POA: Diagnosis not present

## 2023-07-30 DIAGNOSIS — I129 Hypertensive chronic kidney disease with stage 1 through stage 4 chronic kidney disease, or unspecified chronic kidney disease: Secondary | ICD-10-CM | POA: Diagnosis not present

## 2023-07-30 DIAGNOSIS — N1831 Chronic kidney disease, stage 3a: Secondary | ICD-10-CM | POA: Diagnosis not present

## 2023-07-30 DIAGNOSIS — I517 Cardiomegaly: Secondary | ICD-10-CM | POA: Diagnosis not present

## 2023-07-30 DIAGNOSIS — E871 Hypo-osmolality and hyponatremia: Secondary | ICD-10-CM | POA: Diagnosis not present

## 2023-07-30 DIAGNOSIS — R82998 Other abnormal findings in urine: Secondary | ICD-10-CM | POA: Diagnosis not present

## 2023-07-30 DIAGNOSIS — M8589 Other specified disorders of bone density and structure, multiple sites: Secondary | ICD-10-CM | POA: Diagnosis not present

## 2023-07-30 DIAGNOSIS — E1140 Type 2 diabetes mellitus with diabetic neuropathy, unspecified: Secondary | ICD-10-CM | POA: Diagnosis not present

## 2023-09-17 ENCOUNTER — Ambulatory Visit (INDEPENDENT_AMBULATORY_CARE_PROVIDER_SITE_OTHER): Payer: Medicare Other | Admitting: Otolaryngology

## 2023-10-31 ENCOUNTER — Ambulatory Visit (INDEPENDENT_AMBULATORY_CARE_PROVIDER_SITE_OTHER): Admitting: Otolaryngology

## 2023-10-31 ENCOUNTER — Encounter (INDEPENDENT_AMBULATORY_CARE_PROVIDER_SITE_OTHER): Payer: Self-pay | Admitting: Otolaryngology

## 2023-10-31 VITALS — BP 135/79 | HR 90

## 2023-10-31 DIAGNOSIS — R42 Dizziness and giddiness: Secondary | ICD-10-CM

## 2023-10-31 DIAGNOSIS — H903 Sensorineural hearing loss, bilateral: Secondary | ICD-10-CM

## 2023-10-31 DIAGNOSIS — H6123 Impacted cerumen, bilateral: Secondary | ICD-10-CM | POA: Diagnosis not present

## 2023-10-31 NOTE — Progress Notes (Unsigned)
 Patient ID: Tabitha Moore, female   DOB: 1940/04/17, 83 y.o.   MRN: [id number]  Follow-up: Recurrent dizziness, hearing loss  HPI: The patient is an 83 year old female who returns today for her follow-up evaluation.  The patient was previously seen for recurrent dizziness and hearing loss.  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  The patient returns today reporting a brief dizziness episode several months ago.  Otherwise she has been doing well.  She has a history of bilateral high-frequency sensorineural hearing loss, likely secondary to presbycusis.  Currently she denies any otalgia, otorrhea, or spinning vertigo.  She denies any significant change in her hearing.  Exam: General: Communicates without difficulty, well nourished, no acute distress. Head: Normocephalic, no evidence injury, no tenderness, facial buttresses intact without stepoff. Face/sinus: No tenderness to palpation and percussion. Facial movement is normal and symmetric. Eyes: PERRL, EOMI. No scleral icterus, conjunctivae clear. Neuro: CN II exam reveals vision grossly intact.  No nystagmus at any point of gaze. Ears: Auricles well formed without lesions.  Bilateral cerumen impaction.  Nose: External evaluation reveals normal support and skin without lesions.  Dorsum is intact.  Anterior rhinoscopy reveals pink mucosa over anterior aspect of inferior turbinates and intact septum.  No purulence noted. Oral:  Oral cavity and oropharynx are intact, symmetric, without erythema or edema.  Mucosa is moist without lesions. Neck: Full range of motion without pain.  There is no significant lymphadenopathy.  No masses palpable.  Thyroid  bed within normal limits to palpation.  Parotid glands and submandibular glands equal bilaterally without mass.  Trachea is midline. Neuro:  CN 2-12 grossly intact. Gait wide-based. Vestibular: No nystagmus at any point of gaze. Vestibular: There is no nystagmus with  pneumatic pressure on either tympanic membrane or Valsalva. The cerebellar examination is unremarkable.   Procedure: Bilateral cerumen disimpaction Anesthesia: None Description: Under the operating microscope, the cerumen is carefully removed with a combination of cerumen currette, alligator forceps, and suction catheters.  After the cerumen is removed, the TMs are noted to be normal.  No mass, erythema, or lesions. The patient tolerated the procedure well.   Assessment: 1.  Recurrent dizziness.  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  Currently she is not symptomatic. 2.  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  3.  Incidental finding of bilateral cerumen impaction.  Plan: 1.  Otomicroscopy with bilateral cerumen disimpaction.  The physical exam findings and the hearing test results are reviewed with the patient. 2.  The pathophysiology of dizziness and vestibular dysfunction are reviewed with the patient.  3.  Since the patient is currently asymptomatic, we will continue with conservative observation. 4.  If her dizziness recurs, she may benefit from vestibular rehab with a physical therapist. 5.  The patient will return for reevaluation in 1 year.

## 2023-11-01 DIAGNOSIS — H903 Sensorineural hearing loss, bilateral: Secondary | ICD-10-CM | POA: Insufficient documentation

## 2023-11-01 DIAGNOSIS — H6123 Impacted cerumen, bilateral: Secondary | ICD-10-CM | POA: Insufficient documentation

## 2023-11-15 DIAGNOSIS — H209 Unspecified iridocyclitis: Secondary | ICD-10-CM | POA: Diagnosis not present

## 2023-11-15 DIAGNOSIS — E1140 Type 2 diabetes mellitus with diabetic neuropathy, unspecified: Secondary | ICD-10-CM | POA: Diagnosis not present

## 2023-11-15 DIAGNOSIS — I129 Hypertensive chronic kidney disease with stage 1 through stage 4 chronic kidney disease, or unspecified chronic kidney disease: Secondary | ICD-10-CM | POA: Diagnosis not present

## 2023-11-15 DIAGNOSIS — E11319 Type 2 diabetes mellitus with unspecified diabetic retinopathy without macular edema: Secondary | ICD-10-CM | POA: Diagnosis not present

## 2023-11-15 DIAGNOSIS — H6123 Impacted cerumen, bilateral: Secondary | ICD-10-CM | POA: Diagnosis not present

## 2023-11-15 DIAGNOSIS — R42 Dizziness and giddiness: Secondary | ICD-10-CM | POA: Diagnosis not present

## 2023-11-15 DIAGNOSIS — E1122 Type 2 diabetes mellitus with diabetic chronic kidney disease: Secondary | ICD-10-CM | POA: Diagnosis not present

## 2023-11-15 DIAGNOSIS — N1831 Chronic kidney disease, stage 3a: Secondary | ICD-10-CM | POA: Diagnosis not present

## 2023-11-15 DIAGNOSIS — D899 Disorder involving the immune mechanism, unspecified: Secondary | ICD-10-CM | POA: Diagnosis not present

## 2023-11-19 DIAGNOSIS — Z79899 Other long term (current) drug therapy: Secondary | ICD-10-CM | POA: Diagnosis not present

## 2023-11-19 DIAGNOSIS — Z794 Long term (current) use of insulin: Secondary | ICD-10-CM | POA: Diagnosis not present

## 2023-11-19 DIAGNOSIS — H26492 Other secondary cataract, left eye: Secondary | ICD-10-CM | POA: Diagnosis not present

## 2023-11-19 DIAGNOSIS — E113313 Type 2 diabetes mellitus with moderate nonproliferative diabetic retinopathy with macular edema, bilateral: Secondary | ICD-10-CM | POA: Diagnosis not present

## 2023-11-19 DIAGNOSIS — Z961 Presence of intraocular lens: Secondary | ICD-10-CM | POA: Diagnosis not present

## 2023-11-19 DIAGNOSIS — H30033 Focal chorioretinal inflammation, peripheral, bilateral: Secondary | ICD-10-CM | POA: Diagnosis not present

## 2024-02-13 DIAGNOSIS — K219 Gastro-esophageal reflux disease without esophagitis: Secondary | ICD-10-CM | POA: Diagnosis not present
